# Patient Record
Sex: Male | Born: 1945 | Race: White | Hispanic: No | Marital: Married | Smoking: Former smoker
Health system: Southern US, Community
[De-identification: ages and names within clinical notes are randomized; demographics above are authoritative.]

## PROBLEM LIST (undated history)

## (undated) DIAGNOSIS — F1010 Alcohol abuse, uncomplicated: Secondary | ICD-10-CM

## (undated) DIAGNOSIS — M1990 Unspecified osteoarthritis, unspecified site: Secondary | ICD-10-CM

## (undated) DIAGNOSIS — I10 Essential (primary) hypertension: Secondary | ICD-10-CM

## (undated) DIAGNOSIS — Z87442 Personal history of urinary calculi: Secondary | ICD-10-CM

## (undated) DIAGNOSIS — IMO0001 Reserved for inherently not codable concepts without codable children: Secondary | ICD-10-CM

## (undated) DIAGNOSIS — E785 Hyperlipidemia, unspecified: Secondary | ICD-10-CM

## (undated) DIAGNOSIS — I639 Cerebral infarction, unspecified: Secondary | ICD-10-CM

## (undated) DIAGNOSIS — I209 Angina pectoris, unspecified: Secondary | ICD-10-CM

## (undated) HISTORY — PX: TONSILLECTOMY: SUR1361

## (undated) HISTORY — DX: Essential (primary) hypertension: I10

## (undated) HISTORY — DX: Alcohol abuse, uncomplicated: F10.10

## (undated) HISTORY — DX: Cerebral infarction, unspecified: I63.9

## (undated) HISTORY — DX: Hyperlipidemia, unspecified: E78.5

---
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
## 1984-09-27 HISTORY — PX: LUMBAR DISC SURGERY: SHX700

## 1984-09-27 HISTORY — PX: BACK SURGERY: SHX140

## 2004-11-04 ENCOUNTER — Ambulatory Visit (HOSPITAL_COMMUNITY): Admission: RE | Admit: 2004-11-04 | Discharge: 2004-11-04 | Payer: Self-pay | Admitting: Gastroenterology

## 2004-11-04 ENCOUNTER — Encounter (INDEPENDENT_AMBULATORY_CARE_PROVIDER_SITE_OTHER): Payer: Self-pay | Admitting: *Deleted

## 2005-06-22 LAB — HM COLONOSCOPY

## 2006-04-08 ENCOUNTER — Encounter: Admission: RE | Admit: 2006-04-08 | Discharge: 2006-04-08 | Payer: Self-pay | Admitting: Family Medicine

## 2010-05-26 ENCOUNTER — Ambulatory Visit: Payer: Self-pay | Admitting: Cardiovascular Disease

## 2010-05-26 ENCOUNTER — Inpatient Hospital Stay (HOSPITAL_COMMUNITY): Admission: EM | Admit: 2010-05-26 | Discharge: 2010-05-28 | Payer: Self-pay | Admitting: Emergency Medicine

## 2010-05-27 ENCOUNTER — Ambulatory Visit: Payer: Self-pay | Admitting: Vascular Surgery

## 2010-05-28 ENCOUNTER — Ambulatory Visit (HOSPITAL_COMMUNITY): Admission: AD | Admit: 2010-05-28 | Discharge: 2010-05-28 | Payer: Self-pay | Admitting: Cardiology

## 2010-06-02 ENCOUNTER — Inpatient Hospital Stay (HOSPITAL_COMMUNITY): Admission: EM | Admit: 2010-06-02 | Discharge: 2010-06-03 | Payer: Self-pay | Admitting: Emergency Medicine

## 2010-06-04 ENCOUNTER — Encounter
Admission: RE | Admit: 2010-06-04 | Discharge: 2010-08-05 | Payer: Self-pay | Source: Home / Self Care | Admitting: Advanced Practice Midwife

## 2010-10-18 ENCOUNTER — Encounter: Payer: Self-pay | Admitting: Family Medicine

## 2010-12-10 LAB — DIFFERENTIAL
Eosinophils Absolute: 0 10*3/uL (ref 0.0–0.7)
Eosinophils Relative: 1 % (ref 0–5)
Lymphocytes Relative: 32 % (ref 12–46)
Lymphs Abs: 1.9 10*3/uL (ref 0.7–4.0)
Monocytes Relative: 8 % (ref 3–12)

## 2010-12-10 LAB — COMPREHENSIVE METABOLIC PANEL
AST: 32 U/L (ref 0–37)
BUN: 9 mg/dL (ref 6–23)
CO2: 27 mEq/L (ref 19–32)
Calcium: 9.3 mg/dL (ref 8.4–10.5)
GFR calc Af Amer: >60 mL/min (ref >60)
Glucose, Bld: 99 mg/dL (ref 70–99)
Potassium: 3.7 mEq/L (ref 3.5–5.1)

## 2010-12-10 LAB — CBC
HCT: 40.4 % (ref 39.0–52.0)
Hemoglobin: 16 g/dL (ref 13.0–17.0)
MCV: 93.6 fL (ref 78.0–100.0)
MCV: 90.9 fL (ref 78.0–100.0)
Platelets: 166 10*3/uL (ref 150–400)
RBC: 4.32 MIL/uL (ref 4.22–5.81)
RDW: 12 % (ref 11.5–15.5)
WBC: 6.9 10*3/uL (ref 4.0–10.5)
WBC: 6 10*3/uL (ref 4.0–10.5)

## 2010-12-10 LAB — POCT CARDIAC MARKERS: Myoglobin, poc: 36.7 ng/mL (ref 12–200)

## 2010-12-10 LAB — URINALYSIS, ROUTINE W REFLEX MICROSCOPIC
Glucose, UA: NEGATIVE mg/dL
Protein, ur: NEGATIVE mg/dL

## 2010-12-10 LAB — PROTIME-INR
INR: 1.1 (ref 0.00–1.49)
Prothrombin Time: 14.4 seconds (ref 11.6–15.2)

## 2010-12-10 LAB — BASIC METABOLIC PANEL
Chloride: 103 mEq/L (ref 96–112)
Creatinine, Ser: 0.73 mg/dL (ref 0.4–1.5)
GFR calc non Af Amer: >60 mL/min (ref >60)
Glucose, Bld: 101 mg/dL — ABNORMAL HIGH (ref 70–99)

## 2010-12-10 LAB — LIPID PANEL: LDL Cholesterol: 47 mg/dL (ref 0–99)

## 2010-12-10 LAB — APTT: aPTT: 24 seconds (ref 24–37)

## 2010-12-11 LAB — DIFFERENTIAL
Lymphocytes Relative: 26 % (ref 12–46)
Lymphs Abs: 1.9 10*3/uL (ref 0.7–4.0)
Monocytes Relative: 9 % (ref 3–12)
Neutro Abs: 4.9 10*3/uL (ref 1.7–7.7)
Neutrophils Relative %: 64 % (ref 43–77)

## 2010-12-11 LAB — CARDIAC PANEL(CRET KIN+CKTOT+MB+TROPI)
Relative Index: INVALID (ref 0.0–2.5)
Total CK: 65 U/L (ref 7–232)

## 2010-12-11 LAB — URINALYSIS, ROUTINE W REFLEX MICROSCOPIC
Glucose, UA: NEGATIVE mg/dL
Specific Gravity, Urine: 1.033 — ABNORMAL HIGH (ref 1.005–1.030)
pH: 5.5 (ref 5.0–8.0)

## 2010-12-11 LAB — COMPREHENSIVE METABOLIC PANEL
Alkaline Phosphatase: 50 U/L (ref 39–117)
BUN: 15 mg/dL (ref 6–23)
CO2: 26 mEq/L (ref 19–32)
Calcium: 9.4 mg/dL (ref 8.4–10.5)
Chloride: 101 mEq/L (ref 96–112)
Creatinine, Ser: 0.67 mg/dL (ref 0.4–1.5)
GFR calc Af Amer: >60 mL/min (ref >60)

## 2010-12-11 LAB — CBC
HCT: 47.8 % (ref 39.0–52.0)
MCH: 32.7 pg (ref 26.0–34.0)
MCV: 94.4 fL (ref 78.0–100.0)
Platelets: 196 10*3/uL (ref 150–400)
RDW: 12.4 % (ref 11.5–15.5)

## 2010-12-11 LAB — LIPID PANEL
HDL: 47 mg/dL (ref >39)
LDL Cholesterol: 114 mg/dL — ABNORMAL HIGH (ref 0–99)
Triglycerides: 59 mg/dL (ref <150)
VLDL: 12 mg/dL (ref 0–40)

## 2010-12-11 LAB — HEMOGLOBIN A1C
Hgb A1c MFr Bld: 5.1 % (ref <5.7)
Mean Plasma Glucose: 100 mg/dL (ref <117)

## 2010-12-11 LAB — HOMOCYSTEINE: Homocysteine: 11.9 umol/L (ref 4.0–15.4)

## 2011-02-09 ENCOUNTER — Telehealth: Payer: Self-pay | Admitting: Internal Medicine

## 2011-02-09 NOTE — Telephone Encounter (Signed)
Forwarded to Dr. Norins for review. °

## 2011-02-11 ENCOUNTER — Encounter: Payer: Self-pay | Admitting: Internal Medicine

## 2011-02-11 ENCOUNTER — Ambulatory Visit (INDEPENDENT_AMBULATORY_CARE_PROVIDER_SITE_OTHER): Payer: BC Managed Care – PPO | Admitting: Internal Medicine

## 2011-02-11 VITALS — BP 130/90 | HR 63 | Temp 97.1°F | Wt 177.0 lb

## 2011-02-11 DIAGNOSIS — I639 Cerebral infarction, unspecified: Secondary | ICD-10-CM

## 2011-02-11 DIAGNOSIS — F1010 Alcohol abuse, uncomplicated: Secondary | ICD-10-CM

## 2011-02-11 DIAGNOSIS — E785 Hyperlipidemia, unspecified: Secondary | ICD-10-CM

## 2011-02-11 DIAGNOSIS — I10 Essential (primary) hypertension: Secondary | ICD-10-CM

## 2011-02-11 DIAGNOSIS — I6350 Cerebral infarction due to unspecified occlusion or stenosis of unspecified cerebral artery: Secondary | ICD-10-CM

## 2011-02-11 DIAGNOSIS — M25519 Pain in unspecified shoulder: Secondary | ICD-10-CM

## 2011-02-11 DIAGNOSIS — M25511 Pain in right shoulder: Secondary | ICD-10-CM

## 2011-02-11 MED ORDER — DICLOFENAC SODIUM 1 % TD GEL: 1.0000 "application " | Freq: Four times a day (QID) | TRANSDERMAL | Status: DC

## 2011-02-11 MED ORDER — HYDROCODONE-ACETAMINOPHEN 7.5-750 MG PO TABS: 1.0000 | ORAL_TABLET | Freq: Four times a day (QID) | ORAL | Status: DC | PRN

## 2011-02-11 NOTE — Patient Instructions (Signed)
For cholesterol - stop zocor (simvastatin) if pain goes away we will switch back to crestor   For shoulder pain - take the hydrocodone at bedtime. We will refer you to Dr. Rennis Chris.  For stroke - continu eon the p[lavix for at least a year.

## 2011-02-11 NOTE — Progress Notes (Signed)
Subjective:    Patient ID: [name], male    DOB: March 24, 1946, 65 y.o.   MRN: [id number]  HPI Patient presents to establish for on-going primary care.  He had strokes x 2 in August '11.He has made an excellent recovery with minimal residual weakness. He has had pain in the right shoulder with limitation in ROM. PT neighbor suggested frozen shoulder secondary internal derangement as problem.  He did see Dr. Pearlean Brownie in follow-up for stroke but  who diagnosed tendonitis in the right shoulder. He is having constant pain from the shoulder. His mobility of the shoulder is limited. He is not taking any pain medications at this time but he has had experience with narcotic medications in the past so that he knows that hydrocodone has been ineffective for him.  During hospitalization he was started on plavix and crestor. He has since noticed muscle pain especially in the legs when he does squats. Reviewed hospitial data: he had a baseline LDL 114; after starting crestor LDL was 47. Explained the need for low LDL due to high risk having had thrombotic strokes.  Reviewed excellent detailed notes from Dr. Lupita Raider - scanned into the record. Last OV SEPT 21, 2011 of particular interest.    Past Medical History  Diagnosis Date  . Alcohol abuse, unspecified   . Hypertension   . Kidney stone on right side     spontaneously passed  . Stroke     August '11 x 2: L. cerebellar, lateral medulla, ol rd lacunar infarcts;  2nd left pontine infarct.  . Hyperlipidemia     preTx 114, postTx 47 LDL   Past Surgical History  Procedure Date  . Tonsillectomy 1954  . Lumbar disc surgery 1986    Dr. Emilee Hero   Family History  Problem Relation Age of Onset  . Heart disease Father   . Hypertension Father    History   Social History  . Marital Status: Married    Spouse Name: N/A    Number of Children: 2  . Years of Education: 12   Occupational History  . retail sales - paint    Social History  Main Topics  . Smoking status: Former Smoker -- 1.0 packs/day for 15 years    Types: Cigarettes    Quit date: 09/27/1978  . Smokeless tobacco: Never Used  . Alcohol Use: No     stopped drinking after stroke. Had been drink 6 pk per day plus binge drinking  . Drug Use: Not on file  . Sexually Active: Yes -- Male partner(s)   Other Topics Concern  . Not on file   Social History Narrative   HSG. [employer] for 3 years - did a tour in [place]. Married '79. 1 dtr- '68, 1 step son. 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 Marriage in good health. He reports he is very strong in his faith.               Review of Systems Review of Systems  Constitutional:  Negative for fever, chills, activity change and unexpected weight change.  HENT:  Negative for hearing loss, ear pain, congestion, neck stiffness and postnasal drip.   Eyes: Negative for pain, discharge and visual disturbance.  Respiratory: Negative for chest tightness and wheezing.   Cardiovascular: Negative for chest pain and palpitations.       [No decreased exercise tolerance Gastrointestinal: [No change in bowel habit. No  bloating or gas. No reflux or indigestion Genitourinary: Positive for urgency, frequency - nocturia q 2hrs. No  flank pain or  difficulty urinating.  Musculoskeletal: Negative for myalgias, back pain and gait problem. Severe pain right shoulder with limitation in ROM. Neurological: Negative for dizziness, tremors, weakness and headaches.  Hematological: Negative for adenopathy.  Psychiatric/Behavioral: Negative for behavioral problems and dysphoric mood.       Objective:   Physical Exam Vitals reviewed and stable Gen' WNWD WM lookings younger than his chronologic age. No distress HEENT - [place]/AT, C&S clear, PERRLA  Pul - normal respirations Cor - RRR MSK - decreased ROM due to pain with flexion 80%, extension 80%, adduction - to 90 degrees, abduction  close to normal. No crepitus in the shoulder, no click. Very tender to palpation of the insertion of the short -head of the biceps, tender to palp of the long head of the biceps. Neuro -  CN II-XII intact with no facial asymmetry. MS - nl stand and walk. Cerebellar - no tremor.      Procedure Joint/bursal injection-Right shoulder  Indication - localized pain Consent - informed verbal consent from patient after explanation of risks of bleeding and infection Prep - injection site identified, prepped with betadine followed by alcohol. Med -    40 Mg depomedrol with 0.5Cc 2% xylocain Injection - bursa/joint space entered easily. Injected without difficulty. Patient tolerated this well. Post-procedure - patient with no reduction in discomfort. Bandaid applied. Routine precautions provided including instruction to return for fever, drainage or increased pain     Assessment & Plan:  1. Neuro - s/p CVA x 2. Discussed the use of plavix in the first year after stroke. He has made a good recovery.  Plan - continued risk reduction            F/u with Dr. Pearlean Brownie as instructed.  2. Hyperlipidemia - educated [name] about how his risk profile had gone from normal to high and the LDL goal dropped from 130 or less to less the 70. He is below goal on his current regimen  Plan - continue present medication.  3. Hypertension - adequate control - borderline.  Plan - continue present medications            Keep a record of home monitoring levels   4. Alcohol Abuse - he is encouraged to continue his abstinence. He is encouraged to particpate in AA and to get a sponsor.   5. Shoulder pain - no relief from intra-articular injection along with an absence of positive joint findings on exam with tenderness of tendonis insertions suggests tendonitis.  Plan - refer to Dr. Rennis Chris           Trial of hydrocodone 7.5

## 2011-02-12 ENCOUNTER — Encounter: Payer: Self-pay | Admitting: Internal Medicine

## 2011-02-12 ENCOUNTER — Telehealth: Payer: Self-pay | Admitting: *Deleted

## 2011-02-12 DIAGNOSIS — E785 Hyperlipidemia, unspecified: Secondary | ICD-10-CM | POA: Insufficient documentation

## 2011-02-12 DIAGNOSIS — F10188 Alcohol abuse with other alcohol-induced disorder: Secondary | ICD-10-CM | POA: Insufficient documentation

## 2011-02-12 DIAGNOSIS — I639 Cerebral infarction, unspecified: Secondary | ICD-10-CM | POA: Insufficient documentation

## 2011-02-12 DIAGNOSIS — F1010 Alcohol abuse, uncomplicated: Secondary | ICD-10-CM | POA: Insufficient documentation

## 2011-02-12 DIAGNOSIS — I10 Essential (primary) hypertension: Secondary | ICD-10-CM | POA: Insufficient documentation

## 2011-02-12 MED ORDER — METHYLPREDNISOLONE ACETATE 80 MG/ML IJ SUSP: 40.0000 mg | Freq: Once | INTRAMUSCULAR | Status: DC

## 2011-02-12 MED ORDER — OXYCODONE-ACETAMINOPHEN 7.5-325 MG PO TABS: 1.0000 | ORAL_TABLET | Freq: Four times a day (QID) | ORAL | Status: DC | PRN

## 2011-02-12 NOTE — Telephone Encounter (Signed)
Oxycodone/apap 7.5/325 1 po q 6 prn, # 60

## 2011-02-12 NOTE — Telephone Encounter (Signed)
Patient requesting RX for oxycodone. He says he took 3 of the hydrocodone with NO relief. Please advise.

## 2011-02-12 NOTE — Telephone Encounter (Signed)
Rx ready for pick up , let pt VM that he may come by in the AM between 9 and 12

## 2011-02-12 NOTE — Op Note (Signed)
NAME:  [name], [name]               ACCOUNT NO.:  [id number]   MEDICAL RECORD NO.:  [id number]          PATIENT TYPE:  AMB   LOCATION:  ENDO                         FACILITY:  [facility]   PHYSICIAN:  James L. Malon Kindle., M.D.DATE OF BIRTH:  October 25, 1945   DATE OF PROCEDURE:  11/04/2004  DATE OF DISCHARGE:                                 OPERATIVE REPORT   PROCEDURE:  Colonoscopy and polypectomy.   MEDICATIONS:  Fentanyl 137.5 mcg, Versed 12 mg IV.   INDICATIONS FOR PROCEDURE:  Colon cancer screening.   DESCRIPTION OF PROCEDURE:  The procedure had been explained to the patient  and consent obtained.  With the patient in the left lateral decubitus  position, the Olympus scope was inserted and advanced.  Prep was excellent.  We were able to reach the cecum without difficulty.  The ileocecal valve and  appendiceal orifice were seen.  The scope was withdrawn.  In the ascending  colon, a 0.75 cm polyp was removed and sucked through the scope.  Just a bit  above that, another 0.5 cm polyp was removed and sucked through the scope.  These were approximately 10 cm apart and I put them in the same jar.  The  hepatic flexure, transverse colon, splenic flexure, descending and sigmoid  colon were free of polyps.  There was no significant diverticular disease.  The scope was withdrawn.  The patient tolerated the procedure well.   ASSESSMENT:  Polyps removed from the ascending colon.  211.3   PLAN:  Will check pathology and made a recommendation after the pathology is  returned.  Routine postpolypectomy instructions.      JLE/MEDQ  D:  11/04/2004  T:  11/04/2004  Job:  [id number]   cc:   Donia Guiles, M.D.  [address] [place]  [place] [address]  Fax: [phone]

## 2011-02-17 ENCOUNTER — Telehealth: Payer: Self-pay

## 2011-02-17 NOTE — Telephone Encounter (Signed)
Received fax stating that insurance company will not cover Voltaren gel without a PA. Formulary alt are diclofenac sodium, etodolac,naproxen, nabumetone, or meloxicam.  Please advise if pt has a contraindication/drug interaction to generic NSAIDS or inadequate treatment response. Thanks

## 2011-02-17 NOTE — Telephone Encounter (Signed)
PA justification: he has a h./o CVA and is on plavix maintenance. He is intolerant due to discomfort oral NSAIDs. Topical NSAID is indicated as appropriate therapy for local joint disease/pain for the at risk patient.

## 2011-02-25 NOTE — Telephone Encounter (Signed)
PA form requested from [facility] @ [phone] PA form recvd, completed & faxed for Approval 02/23/11 Approval recvd & faxed to pharmacy @ [phone] [02/25/11]

## 2011-03-08 ENCOUNTER — Ambulatory Visit (INDEPENDENT_AMBULATORY_CARE_PROVIDER_SITE_OTHER): Payer: BC Managed Care – PPO | Admitting: Internal Medicine

## 2011-03-09 ENCOUNTER — Ambulatory Visit (INDEPENDENT_AMBULATORY_CARE_PROVIDER_SITE_OTHER): Payer: BC Managed Care – PPO | Admitting: Internal Medicine

## 2011-03-09 ENCOUNTER — Encounter: Payer: Self-pay | Admitting: Internal Medicine

## 2011-03-09 DIAGNOSIS — M25519 Pain in unspecified shoulder: Secondary | ICD-10-CM

## 2011-03-09 DIAGNOSIS — M25511 Pain in right shoulder: Secondary | ICD-10-CM

## 2011-03-09 DIAGNOSIS — F1010 Alcohol abuse, uncomplicated: Secondary | ICD-10-CM

## 2011-03-09 DIAGNOSIS — I639 Cerebral infarction, unspecified: Secondary | ICD-10-CM

## 2011-03-09 DIAGNOSIS — I6350 Cerebral infarction due to unspecified occlusion or stenosis of unspecified cerebral artery: Secondary | ICD-10-CM

## 2011-03-09 DIAGNOSIS — I10 Essential (primary) hypertension: Secondary | ICD-10-CM

## 2011-03-09 MED ORDER — ROSUVASTATIN CALCIUM 5 MG PO TABS: 5.0000 mg | ORAL_TABLET | Freq: Every day | ORAL | Status: DC

## 2011-03-09 MED ORDER — OXYCODONE-ACETAMINOPHEN 10-325 MG PO TABS: 1.0000 | ORAL_TABLET | Freq: Four times a day (QID) | ORAL | Status: DC | PRN

## 2011-03-11 NOTE — Assessment & Plan Note (Signed)
Persistent shoulder pain with limitation in ROM  Plan - keep appointment with Dr. Rennis Chris           Percocet changed to 10/325

## 2011-03-11 NOTE — Assessment & Plan Note (Signed)
Stable. He is continuing his rehab work on his own. He is hoping for full recovery of function in his right leg.

## 2011-03-11 NOTE — Assessment & Plan Note (Signed)
Continues a successful recovery. He relies on his faith and faith community for support in this regard and does not feel that AA offers any additional benefit. He does understand that this is a medical problem and a part of his care is monitoring his recovery.

## 2011-03-11 NOTE — Progress Notes (Signed)
  Subjective:    Patient ID: [name], male    DOB: 03/21/1946, 65 y.o.   MRN: [id number]  HPI [name] presents for follow-up after recently establishing as a new patient. In the interval he has received and reviewed the initial note. He did take a little umbrage at the listing of alcohol abuse as a medical history problem. Explained that this is a disease and an important part of his medical history. He is in recovery since his stroke last August and is given encouragement.  He reports that he continues to exercise and carry out the instructions given to him by neuro-PT. He still has some right leg weakness.   He reports that he is still having a lot of shoulder pain. He is scheduled to see Dr. Rennis Chris for consultation Weds June 13th. He is asking for an increase in the dose of oxycodone.  PMH, FamHx and SocHx reviewed for any changes and relevance.    Review of Systems Review of Systems  Constitutional:  Negative for fever, chills, activity change and unexpected weight change.  HEENT:  Negative for hearing loss, ear pain, congestion, neck stiffness and postnasal drip. Negative for sore throat or swallowing problems. Negative for dental complaints.   Eyes: Negative for vision loss or change in visual acuity.  Respiratory: Negative for chest tightness and wheezing.   Cardiovascular: Negative for chest pain and palpitation. No decreased exercise tolerance Gastrointestinal: No change in bowel habit. No bloating or gas. No reflux or indigestion Genitourinary: Negative for urgency, frequency, flank pain and difficulty urinating.  Musculoskeletal: Negative for myalgias, back pain, and gait problem. Positive for right shoulder pain and limitation in ROM Neurological: Negative for dizziness, tremors, weakness and headaches.  Hematological: Negative for adenopathy.  Psychiatric/Behavioral: Negative for behavioral problems and dysphoric mood.       Objective:   Physical Exam Vitals  noted - great BP control Gen'l - WNWD white man looking younger than his stated age Pul - normal respirations Cor - RRR Neuro - A&O x 3, CN II-XII appear normal. MS - no obvious deficits.       Assessment & Plan:

## 2011-03-11 NOTE — Assessment & Plan Note (Signed)
Excellent control on his present medication

## 2011-04-27 ENCOUNTER — Other Ambulatory Visit: Payer: Self-pay | Admitting: Internal Medicine

## 2011-05-03 ENCOUNTER — Encounter: Payer: Self-pay | Admitting: Internal Medicine

## 2011-05-03 ENCOUNTER — Ambulatory Visit (INDEPENDENT_AMBULATORY_CARE_PROVIDER_SITE_OTHER): Payer: BC Managed Care – PPO | Admitting: Internal Medicine

## 2011-05-03 DIAGNOSIS — I639 Cerebral infarction, unspecified: Secondary | ICD-10-CM

## 2011-05-03 DIAGNOSIS — E785 Hyperlipidemia, unspecified: Secondary | ICD-10-CM

## 2011-05-03 DIAGNOSIS — I10 Essential (primary) hypertension: Secondary | ICD-10-CM

## 2011-05-03 DIAGNOSIS — F1010 Alcohol abuse, uncomplicated: Secondary | ICD-10-CM

## 2011-05-03 MED ORDER — GEMFIBROZIL 600 MG PO TABS: 600.0000 mg | ORAL_TABLET | Freq: Two times a day (BID) | ORAL | Status: DC

## 2011-05-03 NOTE — Patient Instructions (Signed)
Stroke - it will be up to the neurologist as to the continuation of Plavix.  Leg weakness - it may be the crestor vs residual effects of the stroke. Plan - stop crestor completely. If the pain goes completely away it will evidence that it was drug related. If the pain does not go away it is not the drug.  It is important that you resume your rehab exercises to insure maximal persistent recovery of strength.  Nutrition - it is important that you are adequately nourished. You need to eat 3 squares a day.   Alcohol - it is VERY concerning that you have resumed regular use of alcohol. The working definition of addiction is to continue a habit when you have clear proof that it is harmful. Please stop drinking. Please consider AA if you are struggling to stop alcohol.   Cholesterol control - if you stop the crestor - after 2 weeks start lopid: 1/2 tablet once a day for 5 days, 1/2 tablet AM & PM x 5 days, 1 tablet AM 1/2 tablet PM x 5 days and then 1 tablet twice a day. Will need to check lipid panel 8 weeks after starting lopid.   Blood pressure - continue to monitor at home. If the pressure is running 145 or higher on a regular basis report back so that we can make adjustments in your medications.  Domestic stress - if this becomes more of a problem with the need to move or make other changes report back so that we can help with the stress.

## 2011-05-03 NOTE — Assessment & Plan Note (Signed)
BP Readings from Last 3 Encounters:  05/03/11 152/90  03/09/11 120/80  02/11/11 130/90   BP is elevated today on tenoretic once a day.  Plan - continue to monitor at home. If the readings stay high will need to adjust medication.

## 2011-05-03 NOTE — Assessment & Plan Note (Signed)
Concern for drug related muscle pain, although the isolation to just the right leg and only with a deep squat type maneuver raises questions about this being a "statin" related problem.  Plan - stop lipitor, if pain goes away will try alternative medication of lopid bid if any medication is indicated at all. He does make a good point that is lipids were low and that the advantage is in using the "statin" for reduction of risk of further cerebral vessel occlusion.

## 2011-05-03 NOTE — Assessment & Plan Note (Signed)
Persistent right leg weakness which may the sequellae of his stroke.  Plan - he is encouraged to continue an exercise program including what he learned in neuro-rehab

## 2011-05-03 NOTE — Assessment & Plan Note (Addendum)
Reiterated the nature of addiction and the dangers of drinking. He is encouraged to stop drinking and to consider AA.  His pattern of being to tired to cook, drinking and not adequately exercising may contribute to his weakness and fatigue.  Plan Eat better         Routine labs: CBC, LFTs Iron panel if his symptoms of fatigue persis.

## 2011-05-03 NOTE — Progress Notes (Signed)
Subjective:    Patient ID: [name], male    DOB: 03/27/1946, 65 y.o.   MRN: [id number]  HPI [name] returns for follow-up. Please see previous notes: followed for effects post-stroke that left him w/ right leg weakness. Since returning to work in January it has been a struggle to have enough energy to have the ability to get his work done - very physical work in a [employer]all[employer].   He reports that he has not been taking the time to cook thus his diet has slipped.   He is drinking 2-3 beers a night despite having a known alcohol problem in the past. He has not gone to AA but has stopped getting as much support from his church group. We discussed the nature of addictions - continuing a use behavior in the face of known personal danger. There is some stress in the home and marriage with a discussion about down-sizing. He is also contemplating retirement. All this contributes to stress and perhaps his return to the use of alcohol.   He reports that he has pain in the right leg aggravated by work, e.g.  Any deep squat like movement involving the right leg is difficult. He believes this is due to crestor, although there is not other muscle or limb involvement.   Past Medical History  Diagnosis Date  . Alcohol abuse, unspecified   . Hypertension   . Kidney stone on right side     spontaneously passed  . Stroke     August '11 x 2: L. cerebellar, lateral medulla, ol rd lacunar infarcts;  2nd left pontine infarct.  . Hyperlipidemia     preTx 114, postTx 47 LDL   Past Surgical History  Procedure Date  . Tonsillectomy 1954  . Lumbar disc surgery 1986    Dr. Emilee Hero   Family History  Problem Relation Age of Onset  . Heart disease Father   . Hypertension Father    History   Social History  . Marital Status: Married    Spouse Name: N/A    Number of Children: 2  . Years of Education: 12   Occupational History  . retail sales - paint    Social History Main Topics  . Smoking  status: Former Smoker -- 1.0 packs/day for 15 years    Types: Cigarettes    Quit date: 09/27/1978  . Smokeless tobacco: Never Used  . Alcohol Use: No     stopped drinking after stroke. Had been drink 6 pk per day plus binge drinking  . Drug Use: Not on file  . Sexually Active: Yes -- Male partner(s)   Other Topics Concern  . Not on file   Social History Narrative   HSG. [employer] for 3 years - did a tour in [place]. Married '79. 1 dtr- '68, 1 step son. 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 Marriage in good health. He reports he is very strong in his faith.       Review of Systems Review of Systems  Constitutional:  Negative for fever, chills, activity change and unexpected weight change. He does have brief transient light-headedness with position change.  HEENT:  Negative for hearing loss, ear pain, congestion, neck stiffness and postnasal drip. Negative for sore throat or swallowing problems. Negative for dental complaints.   Eyes: Negative for vision loss or change in visual acuity.  Respiratory: Negative for chest tightness and wheezing.  Cardiovascular: Negative for chest pain and palpitation. No decreased exercise tolerance Gastrointestinal: No change in bowel habit. No bloating or gas. No reflux or indigestion Genitourinary: Negative for urgency, frequency, flank pain and difficulty urinating.  Musculoskeletal: Negative for myalgias, back pain, arthralgias and gait problem.  Neurological: Negative for dizziness, tremors, weakness and headaches.  Hematological: Negative for adenopathy.  Psychiatric/Behavioral: Negative for behavioral problems and dysphoric mood.       Objective:   Physical Exam Vitals stable Gen'l - WNWD white male in no distress HEENT - C&S clear, tracks normally. Neck - supple Nodes - negative cervical and supraclavicular regions. Chest - good breath sounds Cor - RRR w/o murmur, rubs. MSK - has a  stiff-limbed gait favoring the right LE. No joint deformity is appreciated. Neuro- A&O x 3, CN II-XII grossly normal. MS - 4/5 right hip flexors, 4/5 LE extension, 3/5 right knee flexion, normal foot strength.  DTR's 2+ reflexes biceps radial and patellar tendons. Skin- clear             Assessment & Plan:

## 2011-05-06 ENCOUNTER — Other Ambulatory Visit: Payer: Self-pay | Admitting: *Deleted

## 2011-05-06 MED ORDER — ATENOLOL-CHLORTHALIDONE 50-25 MG PO TABS: 1.0000 | ORAL_TABLET | Freq: Every day | ORAL | Status: DC

## 2011-07-14 ENCOUNTER — Other Ambulatory Visit: Payer: Self-pay | Admitting: Internal Medicine

## 2011-07-21 ENCOUNTER — Telehealth: Payer: Self-pay | Admitting: *Deleted

## 2011-07-21 NOTE — Telephone Encounter (Signed)
Pt called and states he is still having muscle weakness in pain. Pt states the Percocet 10 is no longer working to relieve pain. What do you advise for pt?

## 2011-07-21 NOTE — Telephone Encounter (Signed)
ROV.   

## 2011-07-22 NOTE — Telephone Encounter (Signed)
Spoke with patient.  1. He complained that his wait time was 2 hours every time he comes to the office. I explained in detail that Dr Debby Bud takes emergent patients frequently to avoid long unnecessary ER visits. He said "if I have an emergency, I'm going to the ER". I then told him that Dr Debby Bud will give him as much time as he needs during every OV no matter what. He thanked me for giving him the time to "vent".  2. He stopped the crestor and has had no change in pain.  3. Percocet has not helped w/pain and he wants stronger med. I explained to the patient that he must see an MD for additional pain meds.  I offered an apt with another MD tomorrow. He does not want to "explain the situation to another doctor" and already has a relationship with Dr Debby Bud. I explained that any other MD in the office could help at least give him a different/additional RX at OV to help easy his pain and we could get pt in to see Dr Debby Bud first of next week.   Patient decided to discuss his situation with his wife and will call in the am if he decides to see another MD tomorrow. He will also call back and schedule an apt with Dr Debby Bud next week.

## 2011-07-23 NOTE — Telephone Encounter (Signed)
1. If the leg pain is not improved off crestor - resume crestor at previous dose and do not start lopid 2. Leg pain - unsure as to cause. Can discuss, reexam at next visit. He was encouraged to follow-up with neurology at his last visit.

## 2011-07-30 NOTE — Telephone Encounter (Signed)
Pt informed

## 2011-09-27 ENCOUNTER — Telehealth: Payer: Self-pay | Admitting: Internal Medicine

## 2011-09-27 NOTE — Telephone Encounter (Signed)
Received copies from [employer] 12.31.12. Forwarded 1 pages to Dr. Debby Bud ,for review.  sj

## 2011-10-11 ENCOUNTER — Ambulatory Visit (INDEPENDENT_AMBULATORY_CARE_PROVIDER_SITE_OTHER): Payer: BC Managed Care – PPO | Admitting: Internal Medicine

## 2011-10-11 VITALS — BP 122/74 | HR 59 | Temp 97.4°F | Wt 180.0 lb

## 2011-10-11 DIAGNOSIS — M545 Low back pain, unspecified: Secondary | ICD-10-CM

## 2011-10-11 MED ORDER — CYCLOBENZAPRINE HCL 10 MG PO TABS: 10.0000 mg | ORAL_TABLET | Freq: Three times a day (TID) | ORAL | Status: AC | PRN

## 2011-10-11 NOTE — Patient Instructions (Signed)
Low back pain with no sign of a pinched nerve. This appears to be muscle strain.  Plan - aleve 220 mg twice a day           Tylenol 500 mg , two tablets three times a day           Flexeril 10 mg every 8 hours for muscle spasm   Back Pain, Adult Low back pain is very common. About 1 in 5 people have back pain. The cause of low back pain is rarely dangerous. The pain often gets better over time. About half of people with a sudden onset of back pain feel better in just 2 weeks. About 8 in 10 people feel better by 6 weeks.   CAUSES Some common causes of back pain include:  Strain of the muscles or ligaments supporting the spine.     Wear and tear (degeneration) of the spinal discs.     Arthritis.    Direct injury to the back.  DIAGNOSIS Most of the time, the direct cause of low back pain is not known. However, back pain can be treated effectively even when the exact cause of the pain is unknown. Answering your caregiver's questions about your overall health and symptoms is one of the most accurate ways to make sure the cause of your pain is not dangerous. If your caregiver needs more information, he or she may order lab work or imaging tests (X-rays or MRIs). However, even if imaging tests show changes in your back, this usually does not require surgery. HOME CARE INSTRUCTIONS For many people, back pain returns. Since low back pain is rarely dangerous, it is often a condition that people can learn to manage on their own.    Remain active. It is stressful on the back to sit or stand in one place. Do not sit, drive, or stand in one place for more than 30 minutes at a time. Take short walks on level surfaces as soon as pain allows. Try to increase the length of time you walk each day.     Do not stay in bed. Resting more than 1 or 2 days can delay your recovery.     Do not avoid exercise or work. Your body is made to move. It is not dangerous to be active, even though your back may hurt. Your  back will likely heal faster if you return to being active before your pain is gone.     Pay attention to your body when you  bend and lift. Many people have less discomfort when lifting if they bend their knees, keep the load close to their bodies, and avoid twisting. Often, the most comfortable positions are those that put less stress on your recovering back.     Find a comfortable position to sleep. Use a firm mattress and lie on your side with your knees slightly bent. If you lie on your back, put a pillow under your knees.     Only take over-the-counter or prescription medicines as directed by your caregiver. Over-the-counter medicines to reduce pain and inflammation are often the most helpful. Your caregiver may prescribe muscle relaxant drugs. These medicines help dull your pain so you can more quickly return to your normal activities and healthy exercise.     Put ice on the injured area.     Put ice in a plastic bag.     Place a towel between your skin and the bag.     Leave  the ice on for 15 to 20 minutes, 3 to 4 times a day for the first 2 to 3 days. After that, ice and heat may be alternated to reduce pain and spasms.     Ask your caregiver about trying back exercises and gentle massage. This may be of some benefit.     Avoid feeling anxious or stressed. Stress increases muscle tension and can worsen back pain. It is important to recognize when you are anxious or stressed and learn ways to manage it. Exercise is a great option.  SEEK MEDICAL CARE IF:  You have pain that is not relieved with rest or medicine.     You have pain that does not improve in 1 week.     You have new symptoms.     You are generally not feeling well.  SEEK IMMEDIATE MEDICAL CARE IF:    You have pain that radiates from your back into your legs.     You develop new bowel or bladder control problems.     You have unusual weakness or numbness in your arms or legs.     You develop nausea or vomiting.      You develop abdominal pain.     You feel faint.  Document Released: 09/13/2005 Document Revised: 05/26/2011 Document Reviewed: 02/01/2011 [facility] Patient Information 2012 [place], [place].

## 2011-10-11 NOTE — Progress Notes (Signed)
  Subjective:    Patient ID: [name], male    DOB: 29-Mar-1946, 66 y.o.   MRN: [id number]  HPI Mr. Frett presents for back pain. He has had HNP in '86 with chronic back issues. Saturday he was carrying his 20 lb dog going up stairs. He felt a pain int he back and then fell down 4 steps. His left hand absorbed most of the fall - sustained bruising of the thenar eminence. He was able to get up with assistance. He took aleve and rested for 40 minutes and then went to the Vet. While at the vet his legs gave out but he was able to drive home. He had another episode of severe back pain and had a controlled fall to the floor. Throughout the day every time he would go to reach or lift objects he would have pain and collapse. He was icing his back. Sunday was OK. Today he awoke feeling well but had a sharp pain when putting on his socks and his symptoms were returned.   I have reviewed the patient's medical history in detail and updated the computerized patient record.    Review of Systems System review is negative for any constitutional, cardiac, pulmonary, GI or neuro symptoms or complaints other than as described in the HPI.     Objective:   Physical Exam Filed Vitals:   10/11/11 1107  BP: 122/74  Pulse: 59  Temp: 97.4 F (36.3 C)   Gen'l- WNWD white male PUlm - normal Cor- RRR Back exam: normal stand; normal flex to greater than 100 degrees; normal gait; normal toe/heel walk; normal step up to exam table; normal SLR sitting; normal DTRs at the patellar tendons; normal sensation to light touch, pin-prick and deep vibratory stimulus; no  CVA tenderness; able to move supine to sitting witout assistance.        Assessment & Plan:  Back pain - no radicular findings on exam. Suspect muscle strain.  Plan - muscle relaxant          NASIDs and APAP

## 2011-10-28 ENCOUNTER — Other Ambulatory Visit: Payer: Self-pay | Admitting: Internal Medicine

## 2011-10-28 NOTE — Telephone Encounter (Signed)
Done

## 2011-11-15 ENCOUNTER — Encounter: Payer: Self-pay | Admitting: Internal Medicine

## 2011-11-15 ENCOUNTER — Ambulatory Visit (INDEPENDENT_AMBULATORY_CARE_PROVIDER_SITE_OTHER): Payer: BC Managed Care – PPO | Admitting: Internal Medicine

## 2011-11-15 ENCOUNTER — Other Ambulatory Visit (INDEPENDENT_AMBULATORY_CARE_PROVIDER_SITE_OTHER): Payer: BC Managed Care – PPO

## 2011-11-15 VITALS — BP 140/90 | HR 65 | Temp 97.4°F | Resp 16 | Ht 73.0 in | Wt 178.2 lb

## 2011-11-15 DIAGNOSIS — I10 Essential (primary) hypertension: Secondary | ICD-10-CM

## 2011-11-15 DIAGNOSIS — F1010 Alcohol abuse, uncomplicated: Secondary | ICD-10-CM

## 2011-11-15 DIAGNOSIS — E785 Hyperlipidemia, unspecified: Secondary | ICD-10-CM

## 2011-11-15 DIAGNOSIS — I639 Cerebral infarction, unspecified: Secondary | ICD-10-CM

## 2011-11-15 DIAGNOSIS — Z125 Encounter for screening for malignant neoplasm of prostate: Secondary | ICD-10-CM

## 2011-11-15 DIAGNOSIS — Z23 Encounter for immunization: Secondary | ICD-10-CM

## 2011-11-15 DIAGNOSIS — Z0000 Encounter for general adult medical examination without abnormal findings: Secondary | ICD-10-CM

## 2011-11-15 LAB — CBC WITH DIFFERENTIAL/PLATELET
Basophils Relative: 0.3 % (ref 0.0–3.0)
Eosinophils Absolute: 0.1 10*3/uL (ref 0.0–0.7)
Hemoglobin: 15.7 g/dL (ref 13.0–17.0)
Lymphs Abs: 1.8 10*3/uL (ref 0.7–4.0)
MCHC: 34 g/dL (ref 30.0–36.0)
MCV: 93.2 fl (ref 78.0–100.0)
Monocytes Absolute: 0.5 10*3/uL (ref 0.1–1.0)
Neutro Abs: 2.8 10*3/uL (ref 1.4–7.7)
RBC: 4.94 Mil/uL (ref 4.22–5.81)

## 2011-11-15 LAB — COMPREHENSIVE METABOLIC PANEL
AST: 25 U/L (ref 0–37)
BUN: 14 mg/dL (ref 6–23)
CO2: 28 mEq/L (ref 19–32)
Calcium: 9.7 mg/dL (ref 8.4–10.5)
Chloride: 100 mEq/L (ref 96–112)
Creatinine, Ser: 0.7 mg/dL (ref 0.4–1.5)
GFR: 131.25 mL/min (ref >60.00)

## 2011-11-15 LAB — LIPID PANEL
Cholesterol: 176 mg/dL (ref 0–200)
HDL: 66.3 mg/dL (ref >39.00)
LDL Cholesterol: 102 mg/dL — ABNORMAL HIGH (ref 0–99)
Triglycerides: 41 mg/dL (ref 0.0–149.0)

## 2011-11-15 LAB — HEPATIC FUNCTION PANEL
Alkaline Phosphatase: 52 U/L (ref 39–117)
Bilirubin, Direct: 0.2 mg/dL (ref 0.0–0.3)

## 2011-11-15 NOTE — Progress Notes (Signed)
Subjective:    Patient ID: [name], male    DOB: Feb 02, 1946, 66 y.o.   MRN: [id number]  HPI [name] presents for routine medical exam. He was last seen in mid-January for back pain that progressed. He was seeing a chiropractor daily for 12 days with good results. He did have plain x-rays of the back. He was diagnosed with a "pinched nerve."  He is otherwise doing well.   Past Medical History  Diagnosis Date  . Alcohol abuse, unspecified   . Hypertension   . Kidney stone on right side     spontaneously passed  . Stroke     August '11 x 2: L. cerebellar, lateral medulla, ol rd lacunar infarcts;  2nd left pontine infarct.  . Hyperlipidemia     preTx 114, postTx 47 LDL   Past Surgical History  Procedure Date  . Tonsillectomy 1954  . Lumbar disc surgery 1986    Dr. Emilee Hero   Family History  Problem Relation Age of Onset  . Heart disease Father   . Hypertension Father    History   Social History  . Marital Status: Married    Spouse Name: N/A    Number of Children: 2  . Years of Education: 12   Occupational History  . retail sales - paint    Social History Main Topics  . Smoking status: Former Smoker -- 1.0 packs/day for 15 years    Types: Cigarettes    Quit date: 09/27/1978  . Smokeless tobacco: Never Used  . Alcohol Use: No     stopped drinking after stroke. Had been drink 6 pk per day plus binge drinking  . Drug Use: Not on file  . Sexually Active: Yes -- Male partner(s)   Other Topics Concern  . Not on file   Social History Narrative   [employer]G. [employer] for 3 years - did a tour in [place]. Married '79. 1 dtr- '68, 1 step son. 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 Marriage in good health. He reports he is very strong in his faith.       Review of Systems Constitutional:  Negative for fever, chills, activity change and unexpected weight change.  HEENT:  Negative for hearing loss, ear pain,  congestion, neck stiffness and postnasal drip. Negative for sore throat or swallowing problems. Negative for dental complaints.   Eyes: Negative for vision loss or change in visual acuity.  Respiratory: Negative for chest tightness and wheezing. Negative for DOE.   Cardiovascular: Negative for chest pain or palpitations. No decreased exercise tolerance Gastrointestinal: No change in bowel habit. No bloating or gas. No reflux or indigestion Genitourinary: Negative for urgency, frequency, flank pain and difficulty urinating.  Musculoskeletal: Negative for myalgias, back pain, arthralgias and gait problem.  Neurological: Negative for dizziness, tremors, weakness and headaches.  Hematological: Negative for adenopathy.  Psychiatric/Behavioral: Negative for behavioral problems and dysphoric mood.       Objective:   Physical Exam Filed Vitals:   11/15/11 0909  BP: 140/90  Pulse: 65  Temp: 97.4 F (36.3 C)  Resp: 16  Weight: 178 lb 4 oz (80.854 kg)  Body mass index is 23.52 kg/(m^2).  Gen'l: Well nourished well developed white male in no acute distress  HEENT: Head: Normocephalic and atraumatic. Right Ear: External ear normal. EAC/TM nl. Left Ear: External ear normal.  EAC/TM nl. Nose: Nose normal. Mouth/Throat: Oropharynx is clear and moist. Dentition -  native, in good repair. No buccal or palatal lesions. Posterior pharynx clear. Eyes: Conjunctivae and sclera clear. EOM intact. Pupils are equal, round, and reactive to light. Right eye exhibits no discharge. Left eye exhibits no discharge. Neck: Normal range of motion. Neck supple. No JVD present. No tracheal deviation present. No thyromegaly present.  Cardiovascular: Normal rate, regular rhythm, no gallop, no friction rub, no murmur heard.      Quiet precordium. 2+ radial and DP pulses . No carotid bruits Pulmonary/Chest: Effort normal. No respiratory distress or increased WOB, no wheezes, no rales. No chest wall deformity or  CVAT. Abdominal: Soft. Bowel sounds are normal in all quadrants. He exhibits no distension, no tenderness, no rebound or guarding, No heptosplenomegaly  Genitourinary:   Musculoskeletal: Normal range of motion. He exhibits no edema and no tenderness.       Small and large joints without redness, synovial thickening or deformity. Full range of motion preserved about all small, median and large joints.  Lymphadenopathy:    He has no cervical or supraclavicular adenopathy.  Neurological: He is alert and oriented to person, place, and time. CN II-XII intact. DTRs 2+ and symmetrical biceps, radial and patellar tendons. Cerebellar function normal with no tremor, rigidity, normal gait and station.  Skin: Skin is warm and dry. No rash noted. No erythema.  Psychiatric: He has a normal mood and affect. His behavior is normal. Thought content normal.   Lab Results  Component Value Date   WBC 5.2 11/15/2011   HGB 15.7 11/15/2011   HCT 46.0 11/15/2011   PLT 205.0 11/15/2011   GLUCOSE 105* 11/15/2011   CHOL 176 11/15/2011   TRIG 41.0 11/15/2011   HDL 66.30 11/15/2011   LDLCALC 102* 11/15/2011        ALT 19 11/15/2011   AST 25 11/15/2011        NA 137 11/15/2011   K 4.1 11/15/2011   CL 100 11/15/2011   CREATININE 0.7 11/15/2011   BUN 14 11/15/2011   CO2 28 11/15/2011   PSA 1.46 11/15/2011   INR 1.10 06/02/2010   HGBA1C  Value: 5.1                                                                        05/26/2010         Assessment & Plan:

## 2011-11-16 DIAGNOSIS — Z0000 Encounter for general adult medical examination without abnormal findings: Secondary | ICD-10-CM | POA: Insufficient documentation

## 2011-11-16 NOTE — Assessment & Plan Note (Signed)
Interval medical history is unremarkable. Physical exam is normal. Lab results OK with LDL above goal. No evidence of diabetes is found. He reports having had colonoscopy in the last 5 years - records requested from [facility]. Immunizations: Tdap given today.  In summary - a nice man on the brink of retiring who is medically stable. He is asked to consider aggressive stroke risk reduction: better control of lipids and blood pressure.  He will report back on Blood pressure monitoring. He should return for follow-up in 6 months.

## 2011-11-16 NOTE — Assessment & Plan Note (Signed)
Has maintained sobriety but is not completely abstemious.

## 2011-11-16 NOTE — Assessment & Plan Note (Signed)
Stable with no signs of recurrence.  Plan - maximize risk reduction: control of lipids, blood pressure and daily aspirin.

## 2011-11-16 NOTE — Assessment & Plan Note (Signed)
Labs reveal that LDL is above goal for a patient with known cerebrovascular disease of 80 or less while off medication. Previously on crestor with an LDL of 47.  Plan - best risk reduction in regard to recurrent events is an LDL of less than 80. If crestor was well tolerated than resuming medication is recommended.  Note: current med list includes simvastatin 40 mg daily and gemfibrozil 600 mg bid.

## 2011-11-16 NOTE — Assessment & Plan Note (Signed)
BP Readings from Last 3 Encounters:  11/15/11 140/90  10/11/11 122/74  05/03/11 152/90   Mixed control. Greatest risk reduction is with BP consistently under control: 130s/80s.  Plan- home monitoring and report back if SBP 140+ on a regular basis for medication adjustment.

## 2012-02-10 ENCOUNTER — Other Ambulatory Visit: Payer: Self-pay | Admitting: *Deleted

## 2012-02-10 MED ORDER — ROSUVASTATIN CALCIUM 5 MG PO TABS: 5.0000 mg | ORAL_TABLET | Freq: Every day | ORAL | Status: DC

## 2012-02-10 NOTE — Telephone Encounter (Signed)
Rx crestor [facility] supply [facility]/

## 2012-04-06 ENCOUNTER — Telehealth: Payer: Self-pay

## 2012-04-06 MED ORDER — TADALAFIL 5 MG PO TABS: 5.0000 mg | ORAL_TABLET | Freq: Every day | ORAL | Status: DC | PRN

## 2012-04-06 NOTE — Telephone Encounter (Signed)
Pt called requesting a new Rx for Cialis with a reduced dosage of 5 mg, okay to change?

## 2012-04-06 NOTE — Telephone Encounter (Signed)
cialis 5mg  is designed to be a daily dose, #30 refill x 5

## 2012-04-17 ENCOUNTER — Telehealth: Payer: Self-pay | Admitting: Internal Medicine

## 2012-04-17 DIAGNOSIS — E785 Hyperlipidemia, unspecified: Secondary | ICD-10-CM

## 2012-04-17 NOTE — Telephone Encounter (Signed)
Caller: [name]/Patient; Phone Number: [phone]; Message from caller: Calling to get message to Amy, Dr.  Alvera Novel nurse.  Is having surgery at [facility] August.  23.  Needs Medical Clearance from Dr. Filbert Schilder.  Surgeon's office has emailed/faxed request on 07/19, but [name] wants to make sure this is done quickly.  Requests to have Amy call him and let him know when completed.  Thanks.

## 2012-04-17 NOTE — Telephone Encounter (Signed)
Medical clearance for surgery done.

## 2012-04-18 MED ORDER — ATORVASTATIN CALCIUM 20 MG PO TABS: 20.0000 mg | ORAL_TABLET | Freq: Every day | ORAL | Status: DC

## 2012-04-18 NOTE — Telephone Encounter (Signed)
Ok for lipitor 20 mg once a day. Please change med list, call in RX and enter labs Lipid, hepatic for 4 weeks after change in medication.

## 2012-04-18 NOTE — Telephone Encounter (Signed)
Pt called stating that once he turns 66 next month, his insurance will change and the new p,plan does not cover Crestor. Pt is requesting Rx change to Lipitor, please advise.

## 2012-04-18 NOTE — Telephone Encounter (Signed)
Patient notified that medical clearance for surgery was done by Dr.Norins. Left message on answer machine of this at CB#.Marland Kitchen Patient also request for insurance reason to change crestor to RX of lipitor that his insurance will cover. Please advise

## 2012-04-18 NOTE — Telephone Encounter (Signed)
Medication lipitor sent to [facility] pharmacy. D/C crestor due to insurance. Patient notified of this and labs to be done in 4 weeks

## 2012-04-26 ENCOUNTER — Other Ambulatory Visit: Payer: Self-pay | Admitting: Internal Medicine

## 2012-05-02 ENCOUNTER — Other Ambulatory Visit: Payer: Self-pay | Admitting: *Deleted

## 2012-05-02 MED ORDER — CLOPIDOGREL BISULFATE 75 MG PO TABS: 75.0000 mg | ORAL_TABLET | Freq: Every day | ORAL | Status: DC

## 2012-05-02 NOTE — Telephone Encounter (Signed)
Prior authorization approval sent to [facility] pharmacy for medication Cialis

## 2012-05-24 ENCOUNTER — Ambulatory Visit (HOSPITAL_COMMUNITY)
Admission: RE | Admit: 2012-05-24 | Discharge: 2012-05-24 | Disposition: A | Discharge status: Home / Self Care | Payer: Medicare Other | Source: Ambulatory Visit | Attending: Neurology | Admitting: Neurology

## 2012-05-24 ENCOUNTER — Ambulatory Visit (HOSPITAL_BASED_OUTPATIENT_CLINIC_OR_DEPARTMENT_OTHER)
Admission: RE | Admit: 2012-05-24 | Discharge: 2012-05-24 | Disposition: A | Discharge status: Home / Self Care | Payer: Medicare Other | Source: Ambulatory Visit | Attending: Neurology | Admitting: Neurology

## 2012-05-24 DIAGNOSIS — I6350 Cerebral infarction due to unspecified occlusion or stenosis of unspecified cerebral artery: Secondary | ICD-10-CM

## 2012-05-24 DIAGNOSIS — I639 Cerebral infarction, unspecified: Secondary | ICD-10-CM

## 2012-05-24 DIAGNOSIS — I10 Essential (primary) hypertension: Secondary | ICD-10-CM

## 2012-05-24 DIAGNOSIS — E785 Hyperlipidemia, unspecified: Secondary | ICD-10-CM

## 2012-05-24 DIAGNOSIS — Z0000 Encounter for general adult medical examination without abnormal findings: Secondary | ICD-10-CM

## 2012-05-24 DIAGNOSIS — G459 Transient cerebral ischemic attack, unspecified: Secondary | ICD-10-CM | POA: Insufficient documentation

## 2012-05-24 NOTE — Progress Notes (Signed)
TCD completed. 

## 2012-05-24 NOTE — Progress Notes (Signed)
Bilateral:  No evidence of hemodynamically significant internal carotid artery stenosis.   Vertebral artery flow is antegrade.     

## 2012-06-01 ENCOUNTER — Encounter (HOSPITAL_COMMUNITY): Payer: Self-pay | Admitting: Pharmacy Technician

## 2012-06-06 ENCOUNTER — Encounter (HOSPITAL_COMMUNITY): Payer: Self-pay

## 2012-06-06 ENCOUNTER — Encounter (HOSPITAL_COMMUNITY)
Admission: RE | Admit: 2012-06-06 | Discharge: 2012-06-06 | Disposition: A | Discharge status: Home / Self Care | Payer: Medicare Other | Source: Ambulatory Visit | Attending: Orthopedic Surgery | Admitting: Orthopedic Surgery

## 2012-06-06 HISTORY — DX: Unspecified osteoarthritis, unspecified site: M19.90

## 2012-06-06 HISTORY — DX: Personal history of urinary calculi: Z87.442

## 2012-06-06 LAB — BASIC METABOLIC PANEL
Calcium: 9.9 mg/dL (ref 8.4–10.5)
Chloride: 98 mEq/L (ref 96–112)
Creatinine, Ser: 0.66 mg/dL (ref 0.50–1.35)
GFR calc Af Amer: >90 mL/min (ref >90)
Sodium: 137 mEq/L (ref 135–145)

## 2012-06-06 LAB — SURGICAL PCR SCREEN
MRSA, PCR: NEGATIVE
Staphylococcus aureus: NEGATIVE

## 2012-06-06 LAB — CBC
Platelets: 186 10*3/uL (ref 150–400)
RDW: 12.1 % (ref 11.5–15.5)
WBC: 7.6 10*3/uL (ref 4.0–10.5)

## 2012-06-06 NOTE — Pre-Procedure Instructions (Signed)
20 [name]  06/06/2012   Your procedure is scheduled on:  Thursday, Sept 12  Report to [facility] at 0730 AM.  Call this number if you have problems the morning of surgery: [phone]   Remember:   Do not eat food:After Midnight.  May have clear liquids:until Midnight .    Take these medicines the morning of surgery with A SIP OF WATER: *Atenolol**   Do not wear jewelry, make-up or nail polish.  Do not wear lotions, powders, or perfumes. You may wear deodorant.  Do not shave 48 hours prior to surgery. Men may shave face and neck.  Do not bring valuables to the hospital.  Contacts, dentures or bridgework may not be worn into surgery.  Leave suitcase in the car. After surgery it may be brought to your room.  For patients admitted to the hospital, checkout time is 11:00 AM the day of discharge.   Patients discharged the day of surgery will not be allowed to drive home.  Name and phone number of your driver: Albin Felling, spouse  [phone]  Special Instructions: Incentive Spirometry - Practice and bring it with you on the day of surgery. and CHG Shower Use Special Wash: 1/2 bottle night before surgery and 1/2 bottle morning of surgery.   Please read over the following fact sheets that you were given: Pain Booklet, Coughing and Deep Breathing, MRSA Information and Surgical Site Infection Prevention

## 2012-06-07 MED ORDER — DEXTROSE 5 % IV SOLN
3.0000 g | INTRAVENOUS | Status: AC
  Administered 2012-06-08: 3 g via INTRAVENOUS
  Filled 2012-06-07: qty 3000

## 2012-06-08 ENCOUNTER — Encounter (HOSPITAL_COMMUNITY): Payer: Self-pay | Admitting: *Deleted

## 2012-06-08 ENCOUNTER — Ambulatory Visit (HOSPITAL_COMMUNITY): Payer: Medicare Other | Admitting: Anesthesiology

## 2012-06-08 ENCOUNTER — Encounter (HOSPITAL_COMMUNITY): Payer: Self-pay | Admitting: Anesthesiology

## 2012-06-08 ENCOUNTER — Ambulatory Visit (HOSPITAL_COMMUNITY)
Admission: RE | Admit: 2012-06-08 | Discharge: 2012-06-08 | Disposition: A | Discharge status: Home / Self Care | Payer: Medicare Other | Source: Ambulatory Visit | Attending: Orthopedic Surgery | Admitting: Orthopedic Surgery

## 2012-06-08 ENCOUNTER — Encounter (HOSPITAL_COMMUNITY)
Admission: RE | Disposition: A | Discharge status: Home / Self Care | Payer: Self-pay | Source: Ambulatory Visit | Attending: Orthopedic Surgery

## 2012-06-08 DIAGNOSIS — I69998 Other sequelae following unspecified cerebrovascular disease: Secondary | ICD-10-CM | POA: Insufficient documentation

## 2012-06-08 DIAGNOSIS — Z01810 Encounter for preprocedural cardiovascular examination: Secondary | ICD-10-CM | POA: Insufficient documentation

## 2012-06-08 DIAGNOSIS — M19019 Primary osteoarthritis, unspecified shoulder: Secondary | ICD-10-CM | POA: Insufficient documentation

## 2012-06-08 DIAGNOSIS — Z9889 Other specified postprocedural states: Secondary | ICD-10-CM

## 2012-06-08 DIAGNOSIS — Z01818 Encounter for other preprocedural examination: Secondary | ICD-10-CM | POA: Insufficient documentation

## 2012-06-08 DIAGNOSIS — M67919 Unspecified disorder of synovium and tendon, unspecified shoulder: Secondary | ICD-10-CM | POA: Insufficient documentation

## 2012-06-08 DIAGNOSIS — M24119 Other articular cartilage disorders, unspecified shoulder: Secondary | ICD-10-CM | POA: Insufficient documentation

## 2012-06-08 DIAGNOSIS — M719 Bursopathy, unspecified: Secondary | ICD-10-CM | POA: Insufficient documentation

## 2012-06-08 DIAGNOSIS — M25819 Other specified joint disorders, unspecified shoulder: Secondary | ICD-10-CM | POA: Insufficient documentation

## 2012-06-08 DIAGNOSIS — I10 Essential (primary) hypertension: Secondary | ICD-10-CM | POA: Insufficient documentation

## 2012-06-08 DIAGNOSIS — Z01812 Encounter for preprocedural laboratory examination: Secondary | ICD-10-CM | POA: Insufficient documentation

## 2012-06-08 SURGERY — SHOULDER ARTHROSCOPY WITH DISTAL CLAVICLE RESECTION
Anesthesia: General | Site: Shoulder | Laterality: Left | Wound class: Clean

## 2012-06-08 MED ORDER — PROPOFOL 10 MG/ML IV BOLUS
INTRAVENOUS | Status: DC | PRN
  Administered 2012-06-08: 160 mg via INTRAVENOUS

## 2012-06-08 MED ORDER — OXYCODONE-ACETAMINOPHEN 5-325 MG PO TABS: 1.0000 | ORAL_TABLET | ORAL | Status: AC | PRN

## 2012-06-08 MED ORDER — ONDANSETRON HCL 4 MG/2ML IJ SOLN
INTRAMUSCULAR | Status: DC | PRN
  Administered 2012-06-08: 4 mg via INTRAVENOUS

## 2012-06-08 MED ORDER — DROPERIDOL 2.5 MG/ML IJ SOLN
INTRAMUSCULAR | Status: DC | PRN
  Administered 2012-06-08: 0.625 mg via INTRAVENOUS

## 2012-06-08 MED ORDER — TEMAZEPAM 30 MG PO CAPS: 30.0000 mg | ORAL_CAPSULE | Freq: Every evening | ORAL | Status: DC | PRN

## 2012-06-08 MED ORDER — CYCLOBENZAPRINE HCL 10 MG PO TABS: 10.0000 mg | ORAL_TABLET | Freq: Three times a day (TID) | ORAL | Status: AC | PRN

## 2012-06-08 MED ORDER — MIDAZOLAM HCL 2 MG/2ML IJ SOLN
1.0000 mg | INTRAMUSCULAR | Status: DC | PRN
  Administered 2012-06-08: 2 mg via INTRAVENOUS

## 2012-06-08 MED ORDER — NEOSTIGMINE METHYLSULFATE 1 MG/ML IJ SOLN
INTRAMUSCULAR | Status: DC | PRN
  Administered 2012-06-08: 5 mg via INTRAVENOUS

## 2012-06-08 MED ORDER — FENTANYL CITRATE 0.05 MG/ML IJ SOLN
50.0000 ug | INTRAMUSCULAR | Status: DC | PRN
  Administered 2012-06-08: 100 ug via INTRAVENOUS

## 2012-06-08 MED ORDER — ROCURONIUM BROMIDE 100 MG/10ML IV SOLN
INTRAVENOUS | Status: DC | PRN
  Administered 2012-06-08: 50 mg via INTRAVENOUS

## 2012-06-08 MED ORDER — LACTATED RINGERS IV SOLN
INTRAVENOUS | Status: DC | PRN
  Administered 2012-06-08 (×2): via INTRAVENOUS

## 2012-06-08 MED ORDER — ACETAMINOPHEN 10 MG/ML IV SOLN
INTRAVENOUS | Status: AC
  Filled 2012-06-08: qty 100

## 2012-06-08 MED ORDER — LACTATED RINGERS IV SOLN
INTRAVENOUS | Status: DC
  Administered 2012-06-08: 50 mL/h via INTRAVENOUS

## 2012-06-08 MED ORDER — SUFENTANIL CITRATE 50 MCG/ML IV SOLN
INTRAVENOUS | Status: DC | PRN
  Administered 2012-06-08 (×3): 10 ug via INTRAVENOUS

## 2012-06-08 MED ORDER — CHLORHEXIDINE GLUCONATE 4 % EX LIQD: 60.0000 mL | Freq: Once | CUTANEOUS | Status: DC

## 2012-06-08 MED ORDER — GLYCOPYRROLATE 0.2 MG/ML IJ SOLN
INTRAMUSCULAR | Status: DC | PRN
  Administered 2012-06-08: .6 mg via INTRAVENOUS

## 2012-06-08 MED ORDER — ACETAMINOPHEN 10 MG/ML IV SOLN
INTRAVENOUS | Status: DC | PRN
  Administered 2012-06-08: 1000 mg via INTRAVENOUS

## 2012-06-08 MED ORDER — BUPIVACAINE-EPINEPHRINE PF 0.5-1:200000 % IJ SOLN
INTRAMUSCULAR | Status: DC | PRN
  Administered 2012-06-08: 30 mL

## 2012-06-08 MED ORDER — SODIUM CHLORIDE 0.9 % IR SOLN
Status: DC | PRN
  Administered 2012-06-08: 1000 mL
  Administered 2012-06-08: 6000 mL

## 2012-06-08 MED ORDER — MIDAZOLAM HCL 2 MG/2ML IJ SOLN
INTRAMUSCULAR | Status: AC
  Filled 2012-06-08: qty 2

## 2012-06-08 MED ORDER — FENTANYL CITRATE 0.05 MG/ML IJ SOLN
INTRAMUSCULAR | Status: AC
  Filled 2012-06-08: qty 2

## 2012-06-08 MED ORDER — MIDAZOLAM HCL 5 MG/5ML IJ SOLN
INTRAMUSCULAR | Status: DC | PRN
  Administered 2012-06-08 (×2): 1 mg via INTRAVENOUS

## 2012-06-08 MED ORDER — EPHEDRINE SULFATE 50 MG/ML IJ SOLN
INTRAMUSCULAR | Status: DC | PRN
  Administered 2012-06-08: 5 mg via INTRAVENOUS

## 2012-06-08 MED ORDER — NAPROXEN 500 MG PO TABS: 500.0000 mg | ORAL_TABLET | Freq: Two times a day (BID) | ORAL | Status: DC

## 2012-06-08 SURGICAL SUPPLY — 66 items
ANCH SUT 2 FT CRKSW 14.7X5.5 (Anchor) ×1 IMPLANT
ANCHOR CORKSCREW FIBER 5.5X15 (Anchor) ×1 IMPLANT
BLADE CUTTER GATOR 3.5 (BLADE) ×2 IMPLANT
BLADE GREAT WHITE 4.2 (BLADE) ×2 IMPLANT
BLADE SURG 11 STRL SS (BLADE) ×2 IMPLANT
BOOTCOVER CLEANROOM LRG (PROTECTIVE WEAR) ×4 IMPLANT
BUR 3.5 LG SPHERICAL (BURR) IMPLANT
BUR OVAL 4.0 (BURR) ×2 IMPLANT
BURR 3.5 LG SPHERICAL (BURR)
CANISTER SUCT LVC 12 LTR MEDI- (MISCELLANEOUS) ×2 IMPLANT
CANNULA ACUFLEX KIT 5X76 (CANNULA) ×2 IMPLANT
CANNULA DRILOCK 5.0X75 (CANNULA) ×3 IMPLANT
CLOTH BEACON ORANGE TIMEOUT ST (SAFETY) ×2 IMPLANT
CLSR STERI-STRIP ANTIMIC 1/2X4 (GAUZE/BANDAGES/DRESSINGS) ×1 IMPLANT
CONNECTOR 5 IN 1 STRAIGHT STRL (MISCELLANEOUS) IMPLANT
DRAPE INCISE 23X17 IOBAN STRL (DRAPES) ×1
DRAPE INCISE 23X17 STRL (DRAPES) ×1 IMPLANT
DRAPE INCISE IOBAN 23X17 STRL (DRAPES) ×1 IMPLANT
DRAPE INCISE IOBAN 66X45 STRL (DRAPES) ×2 IMPLANT
DRAPE STERI 35X30 U-POUCH (DRAPES) ×2 IMPLANT
DRAPE SURG 17X11 SM STRL (DRAPES) ×2 IMPLANT
DRAPE U-SHAPE 47X51 STRL (DRAPES) ×2 IMPLANT
DRSG PAD ABDOMINAL 8X10 ST (GAUZE/BANDAGES/DRESSINGS) ×2 IMPLANT
DURAPREP 26ML APPLICATOR (WOUND CARE) ×4 IMPLANT
ELECT REM PT RETURN 9FT ADLT (ELECTROSURGICAL) ×2
ELECTRODE REM PT RTRN 9FT ADLT (ELECTROSURGICAL) ×1 IMPLANT
GLOVE BIO SURGEON STRL SZ7.5 (GLOVE) ×2 IMPLANT
GLOVE BIO SURGEON STRL SZ8 (GLOVE) ×2 IMPLANT
GLOVE BIO SURGEON STRL SZ8.5 (GLOVE) ×1 IMPLANT
GLOVE BIOGEL PI IND STRL 8 (GLOVE) IMPLANT
GLOVE BIOGEL PI INDICATOR 8 (GLOVE) ×1
GLOVE EUDERMIC 7 POWDERFREE (GLOVE) ×2 IMPLANT
GLOVE SS BIOGEL STRL SZ 7.5 (GLOVE) ×1 IMPLANT
GLOVE SUPERSENSE BIOGEL SZ 7.5 (GLOVE) ×1
GLOVE SURG SS PI 8.0 STRL IVOR (GLOVE) ×1 IMPLANT
GLOVE SURG SS PI 8.5 STRL IVOR (GLOVE) ×1
GLOVE SURG SS PI 8.5 STRL STRW (GLOVE) IMPLANT
GOWN STRL NON-REIN LRG LVL3 (GOWN DISPOSABLE) ×2 IMPLANT
GOWN STRL REIN XL XLG (GOWN DISPOSABLE) ×8 IMPLANT
KIT BASIN OR (CUSTOM PROCEDURE TRAY) ×2 IMPLANT
KIT ROOM TURNOVER OR (KITS) ×2 IMPLANT
KIT SHOULDER TRACTION (DRAPES) ×2 IMPLANT
MANIFOLD NEPTUNE II (INSTRUMENTS) ×2 IMPLANT
NDL SPNL 18GX3.5 QUINCKE PK (NEEDLE) ×1 IMPLANT
NDL SUT 6 .5 CRC .975X.05 MAYO (NEEDLE) IMPLANT
NEEDLE MAYO TAPER (NEEDLE)
NEEDLE SPNL 18GX3.5 QUINCKE PK (NEEDLE) ×2 IMPLANT
NS IRRIG 1000ML POUR BTL (IV SOLUTION) ×2 IMPLANT
PACK SHOULDER (CUSTOM PROCEDURE TRAY) ×2 IMPLANT
PAD ARMBOARD 7.5X6 YLW CONV (MISCELLANEOUS) ×4 IMPLANT
SET ARTHROSCOPY TUBING (MISCELLANEOUS) ×2
SET ARTHROSCOPY TUBING LN (MISCELLANEOUS) ×1 IMPLANT
SLING ARM FOAM STRAP LRG (SOFTGOODS) ×1 IMPLANT
SLING ARM FOAM STRAP MED (SOFTGOODS) ×1 IMPLANT
SPONGE GAUZE 4X4 12PLY (GAUZE/BANDAGES/DRESSINGS) ×1 IMPLANT
SPONGE LAP 4X18 X RAY DECT (DISPOSABLE) IMPLANT
STRIP CLOSURE SKIN 1/2X4 (GAUZE/BANDAGES/DRESSINGS) ×1 IMPLANT
SUT MNCRL AB 3-0 PS2 18 (SUTURE) ×2 IMPLANT
SUT PDS AB 0 CT 36 (SUTURE) ×1 IMPLANT
SUT RETRIEVER GRASP 30 DEG (SUTURE) ×1 IMPLANT
SYR 20CC LL (SYRINGE) ×2 IMPLANT
TAPE PAPER 3X10 WHT MICROPORE (GAUZE/BANDAGES/DRESSINGS) ×1 IMPLANT
TOWEL OR 17X24 6PK STRL BLUE (TOWEL DISPOSABLE) ×2 IMPLANT
TOWEL OR 17X26 10 PK STRL BLUE (TOWEL DISPOSABLE) ×2 IMPLANT
WAND SUCTION MAX 4MM 90S (SURGICAL WAND) ×2 IMPLANT
WATER STERILE IRR 1000ML POUR (IV SOLUTION) ×2 IMPLANT

## 2012-06-08 NOTE — Anesthesia Procedure Notes (Signed)
Anesthesia Regional Block:  Interscalene brachial plexus block  Pre-Anesthetic Checklist: ,, timeout performed, Correct Patient, Correct Site, Correct Laterality, Correct Procedure, Correct Position, site marked, Risks and benefits discussed,  Surgical consent,  Pre-op evaluation,  At surgeon's request and post-op pain management  Laterality: Right  Prep: chloraprep       Needles:  Injection technique: Single-shot  Needle Type: Stimulator Needle - 40     Needle Length: 4cm  Needle Gauge: 22 and 22 G    Additional Needles:  Procedures: nerve stimulator Interscalene brachial plexus block  Nerve Stimulator or Paresthesia:  Response: forearm twitch, 0.45 mA, 0.1 ms,   Additional Responses:   Narrative:  Start time: 06/08/2012 9:16 AM End time: 06/08/2012 9:27 AM Injection made incrementally with aspirations every 5 mL.  Events: other event  Performed by: Personally  Anesthesiologist: Sandford Craze, MD  Additional Notes: Pt identified in Holding room.  Monitors applied. Working IV access confirmed. Sterile prep R neck.  #22ga PNS to forearm twitch at 0.20mA threshold.  30cc 0.5% Bupivacaine with 1:200k epi injected incrementally after negative test dose.  Patient asymptomatic, VSS, no heme aspirated, tolerated well.   Sandford Craze, MD

## 2012-06-08 NOTE — H&P (Signed)
[name]    Chief Complaint: right shoulder impingement HPI: The patient is a 66 y.o. male with chronic right shoulder impingement syndrome  Past Medical History  Diagnosis Date  . Alcohol abuse, unspecified   . Hypertension   . Kidney stone on right side     spontaneously passed  . Stroke     August '11 x 2: L. cerebellar, lateral medulla, ol rd lacunar infarcts;  2nd left pontine infarct.  . Hyperlipidemia     preTx 114, postTx 47 LDL  . H/O renal calculi   . Arthritis     in back and shoulder    Past Surgical History  Procedure Date  . Tonsillectomy 1954  . Lumbar disc surgery 1986    Dr. Emilee Hero  . Tonsillectomy   . Back surgery 1986    Family History  Problem Relation Age of Onset  . Heart disease Father   . Hypertension Father     Social History:  reports that he quit smoking about 33 years ago. His smoking use included Cigarettes. He has a 15 pack-year smoking history. He has never used smokeless tobacco. He reports that he drinks about 1.8 ounces of alcohol per week. He reports that he does not use illicit drugs.  Allergies: No Known Allergies  Medications Prior to Admission  Medication Sig Dispense Refill  . atenolol-chlorthalidone (TENORETIC) 50-25 MG per tablet Take 1 tablet by mouth daily.      . rosuvastatin (CRESTOR) 5 MG tablet Take 5 mg by mouth daily.      . clopidogrel (PLAVIX) 75 MG tablet Take 75 mg by mouth daily.      . naproxen sodium (ANAPROX) 220 MG tablet Take 220 mg by mouth 2 (two) times daily as needed. For pain         Physical Exam: right shoudler with painful arc of motion and impingement as noted at last office visit  Vitals  Temp:  [98.4 F (36.9 C)] 98.4 F (36.9 C) (09/12 0729) Pulse Rate:  [70] 70  (09/12 0729) Resp:  [18] 18  (09/12 0729) BP: (146)/(93) 146/93 mmHg (09/12 0729) SpO2:  [97 %] 97 % (09/12 0729)  Assessment/Plan  Impression: right shoulder impingement  Plan of Action: Procedure(s): SHOULDER  ARTHROSCOPY WITH DISTAL CLAVICLE RESECTION, subacromial decompression  [name] 06/08/2012, 10:09 AM

## 2012-06-08 NOTE — Preoperative (Signed)
Beta Blockers   Reason not to administer Beta Blockers:Not Applicable 

## 2012-06-08 NOTE — Anesthesia Preprocedure Evaluation (Signed)
Anesthesia Evaluation  Patient identified by MRN, date of birth, ID band Patient awake    Reviewed: Allergy & Precautions, H&P , NPO status , Patient's Chart, lab work & pertinent test results, reviewed documented beta blocker date and time   History of Anesthesia Complications Negative for: history of anesthetic complications  Airway Mallampati: I TM Distance: >3 FB Neck ROM: Full    Dental  (+) Teeth Intact, Dental Advisory Given and Caps   Pulmonary neg pulmonary ROS, former smoker (quit 1980's),  breath sounds clear to auscultation  Pulmonary exam normal       Cardiovascular hypertension, Pt. on medications and Pt. on home beta blockers Rhythm:Regular Rate:Normal     Neuro/Psych CVA (R weakness), Residual Symptoms negative psych ROS   GI/Hepatic negative GI ROS, Neg liver ROS,   Endo/Other  negative endocrine ROS  Renal/GU negative Renal ROS     Musculoskeletal   Abdominal   Peds  Hematology   Anesthesia Other Findings   Reproductive/Obstetrics                           Anesthesia Physical Anesthesia Plan  ASA: III  Anesthesia Plan: General   Post-op Pain Management:    Induction: Intravenous  Airway Management Planned: Oral ETT  Additional Equipment:   Intra-op Plan:   Post-operative Plan: Extubation in OR  Informed Consent: I have reviewed the patients History and Physical, chart, labs and discussed the procedure including the risks, benefits and alternatives for the proposed anesthesia with the patient or authorized representative who has indicated his/her understanding and acceptance.   Dental advisory given  Plan Discussed with: Surgeon and CRNA  Anesthesia Plan Comments: (Plan routine monitors, GETA with interscalene block for post op analgesia)        Anesthesia Quick Evaluation

## 2012-06-08 NOTE — Transfer of Care (Signed)
Immediate Anesthesia Transfer of Care Note  Patient: [name]  Procedure(s) Performed: Procedure(s) (LRB) with comments: SHOULDER ARTHROSCOPY WITH DISTAL CLAVICLE RESECTION (Left) - Right Shoulder Arthroscopy with Subacromial Decompression, Distal Clavicle Resection  Patient Location: PACU  Anesthesia Type: MAC combined with regional for post-op pain  Level of Consciousness: awake and alert   Airway & Oxygen Therapy: Patient Spontanous Breathing and Patient connected to face mask oxygen  Post-op Assessment: Report given to PACU RN and Post -op Vital signs reviewed and stable  Post vital signs: Reviewed and stable  Complications: No apparent anesthesia complications

## 2012-06-08 NOTE — Anesthesia Postprocedure Evaluation (Signed)
  Anesthesia Post-op Note  Patient: [name]  Procedure(s) Performed: Procedure(s) (LRB) with comments: SHOULDER ARTHROSCOPY WITH DISTAL CLAVICLE RESECTION (Left) - Right Shoulder Arthroscopy with Subacromial Decompression, Distal Clavicle Resection  Patient Location: PACU  Anesthesia Type: GA combined with regional for post-op pain  Level of Consciousness: awake, alert , oriented and patient cooperative  Airway and Oxygen Therapy: Patient Spontanous Breathing  Post-op Pain: none  Post-op Assessment: Post-op Vital signs reviewed, Patient's Cardiovascular Status Stable, Respiratory Function Stable, Patent Airway, No signs of Nausea or vomiting and Pain level controlled  Post-op Vital Signs: Reviewed and stable  Complications: No apparent anesthesia complications

## 2012-06-08 NOTE — Op Note (Signed)
06/08/2012  12:20 PM  PATIENT:   [name]  66 y.o. male  PRE-OPERATIVE DIAGNOSIS:  right shoulder impingement  POST-OPERATIVE DIAGNOSIS:  Right shoulder impingement, labral tear, rotator cuff tear, AC joint OA  PROCEDURE:  RSA, labral debridement, SAD, DCR, RCR  SURGEON:  Jarick Harkins, Vania Rea M.D.  ASSISTANTS: Shuford pac   ANESTHESIA:   GET + ISB  EBL: min  SPECIMEN:  none  Drains: none   PATIENT DISPOSITION:  PACU - hemodynamically stable.    PLAN OF CARE: Discharge to home after PACU  Dictation# [id number]

## 2012-06-09 NOTE — Op Note (Signed)
[name], [name] NO.:  [id number]  MEDICAL RECORD NO.:  [id number]  LOCATION:  [facility]                         FACILITY:  [facility]  PHYSICIAN:  Vania Rea. Paislie Tessler, M.D.  DATE OF BIRTH:  1946/06/18  DATE OF PROCEDURE:  06/08/2012 DATE OF DISCHARGE:  06/08/2012                              OPERATIVE REPORT   PREOPERATIVE DIAGNOSES: 1. Chronic right shoulder impingement syndrome with possible rotator     cuff tear 2. Right shoulder acromioclavicular joint arthropathy.  POSTOPERATIVE DIAGNOSES: 1. Chronic right shoulder impingement syndrome. 2. Right shoulder rotator cuff tear. 3. Right shoulder acromioclavicular joint arthrosis. 4. Right shoulder labral tear.  PROCEDURES: 1. Right shoulder examination under anesthesia. 2. Right shoulder glenohumeral joint diagnostic arthroscopy. 3. Debridement of extensive degenerative labral tear. 4. Arthroscopic subacromial decompression and bursectomy. 5. Arthroscopic distal clavicle resection. 6. Arthroscopic rotator cuff repair using a single row repair     construct.  SURGEON:  Vania Rea. Khady Vandenberg, MD  ASSISTANT:  Lucita Lora. Shuford, PA-C  ANESTHESIA:  General endotracheal as well as interscalene block.  BLOOD LOSS:  Minimal.  DRAINS:  None.  HISTORY:  [name] is a 66 year old gentleman who has had chronic and progressive increasing right shoulder pain with impingement syndrome and MRI scan suggesting a probable partial rotator cuff tear and confirms AC joint degenerative changes.  Due to his ongoing pain and function limitations, he is brought to the operating room at this time for planned right shoulder arthroscopy as described below.  I preoperatively counseled [name] on treatment options as well as risks versus benefits thereof.  Possible surgical complications were reviewed including the potential for bleeding, infection, neurovascular injury, persistent pain, loss of motion, anesthetic  complication, possible need for additional surgery.  He understands and accepts and agrees with our planned procedure.  PROCEDURE IN DETAIL:  After undergoing routine preop evaluation, the patient received prophylactic antibiotics.  An interscalene block was established in the holding area by the Anesthesia Department.  Placed supine on the op table, underwent smooth induction of a general endotracheal anesthesia.  Turned to left lateral decubitus position on a beanbag and appropriately padded and protected.  Right shoulder examination under anesthesia reveals a moderate restriction mobility with only 140 degrees for elevation and manipulation is performed with no significant increase in mobility.  Right arm was then suspended at 70 degrees of abduction with 10 pounds of traction and the right shoulder girdle region was sterilely prepped and draped in standard fashion. Time-out was called.  Post portal was established in the glenohumeral joint, the anterior portal was established under direct visualization. Glenohumeral articular surfaces were all in excellent condition.  There was noted to be degenerative tearing of the anterior superior and posterior superior aspects of the glenoid labrum and these areas were debrided back to stable margin with the shaver.  Biceps anchor was stable.  Biceps tendon was normal caliber.  The rotator cuff showed at least a partial articular sided rotator cuff tear which was debrided with shaver and passed tag suture of 0 PDS at this point.  Inspection of the glenohumeral joint showed no additional pathologies.  At this point, fluid and instrument was then  removed.  The arm was dropped down to 30 degrees abduction with arthroscope introduced in the subacromial space of the posterior portal and direct lateral portals of the subacromial space.  Abundant dense bursal tissue, multiple adhesions were encountered and these were all divided and excised with  combination of shaver and Stryker wand.  The wand was then used to remove the periosteum from the undersurface of the anterior half of the acromion. The subacromial decompression was performed with a bur creating a type 1 morphology.  Portal was then established directly anterior to the distal clavicle and distal clavicle resection performed with a bur.  Care was taken to confirm visualization of the entire circumference of the distal clavicle to ensure adequate removal of bone.  We then completed the subacromial/subdeltoid bursectomy.  We then probed the bursal surface of the rotator cuff and this did indeed show a small full-thickness defect of the distal supraspinatus.  We went ahead and debrided this area with ultimately having a defect, approximately a cm and a half in width.  The rotator cuff was debrided back to stable and healthy margin with the shaver and then we prepared the tuberosity removing the soft tissue with the Stryker wand using a shaver to abrade the bone.  We then established accessory portal device through a stab in lateral margin of the acromion, placed Arthrex peek corkscrew suture anchor.  The limbs of the suture anchor were then passed equal distance to cross the width of the rotator cuff tear in a horizontal mattress pattern and then tied with sliding locking knots followed by multiple overhand throws and alternating posts.  This allowed excellent reapposition of the free margin of the rotator cuff tear and the bony bed of the tuberosity. Suture limbs were then clipped.  The overall repair construct was much to our satisfaction.  Final hemostasis was obtained.  Bursectomy completed.  Fluid and instrument removed.  Ralene Bathe, PA-C was used as an Geophysicist/field seismologist throughout this case, essential for help with positioning of the extremity, management of the arthroscopic equipment, suture management, tissue manipulation, wound closure and intraoperative decision  making.  The wound was closed with Monocryl and Steri-Strips.  Dry dressing taped at the right shoulder. Right arm was placed in a sling.  The patient was awakened, extubated, and taken to recovery room in stable condition.     Vania Rea. Elvan Ebron, M.D.     KMS/MEDQ  D:  06/08/2012  T:  06/09/2012  Job:  [id number]

## 2012-06-10 HISTORY — PX: ROTATOR CUFF REPAIR: SHX139

## 2012-11-24 ENCOUNTER — Telehealth: Payer: Self-pay | Admitting: Internal Medicine

## 2012-11-24 MED ORDER — ROSUVASTATIN CALCIUM 5 MG PO TABS: 5.0000 mg | ORAL_TABLET | Freq: Every day | ORAL | Status: DC

## 2012-11-24 MED ORDER — CLOPIDOGREL BISULFATE 75 MG PO TABS: 75.0000 mg | ORAL_TABLET | Freq: Every day | ORAL | Status: DC

## 2012-11-24 MED ORDER — ATENOLOL-CHLORTHALIDONE 50-25 MG PO TABS: 1.0000 | ORAL_TABLET | Freq: Every day | ORAL | Status: DC

## 2012-11-24 NOTE — Telephone Encounter (Signed)
Patient requesting written scripts for his 3 medications to be picked up next week so that he can submitt them to his mail order pharmacy, he did not leave the medication names

## 2012-11-24 NOTE — Telephone Encounter (Signed)
Pt requested hard scripts. He is changing to mail order pharmacy and needs hard scripts to send in. Call pt when ready to pick up.

## 2012-12-11 ENCOUNTER — Encounter: Payer: Medicare Other | Admitting: Internal Medicine

## 2012-12-21 ENCOUNTER — Other Ambulatory Visit: Payer: Self-pay | Admitting: *Deleted

## 2012-12-21 ENCOUNTER — Other Ambulatory Visit: Payer: Self-pay | Admitting: Internal Medicine

## 2012-12-21 MED ORDER — ATORVASTATIN CALCIUM 20 MG PO TABS: 20.0000 mg | ORAL_TABLET | Freq: Every day | ORAL | Status: DC

## 2013-01-08 ENCOUNTER — Encounter: Payer: Medicare Other | Admitting: Internal Medicine

## 2013-01-25 ENCOUNTER — Other Ambulatory Visit: Payer: Self-pay | Admitting: Internal Medicine

## 2013-02-20 ENCOUNTER — Ambulatory Visit (INDEPENDENT_AMBULATORY_CARE_PROVIDER_SITE_OTHER): Payer: Medicare Other | Admitting: Internal Medicine

## 2013-02-20 ENCOUNTER — Encounter: Payer: Self-pay | Admitting: Internal Medicine

## 2013-02-20 ENCOUNTER — Other Ambulatory Visit (INDEPENDENT_AMBULATORY_CARE_PROVIDER_SITE_OTHER): Payer: Medicare Other

## 2013-02-20 ENCOUNTER — Other Ambulatory Visit: Payer: Self-pay

## 2013-02-20 VITALS — BP 156/88 | HR 65 | Temp 97.8°F | Ht 73.0 in | Wt 190.0 lb

## 2013-02-20 DIAGNOSIS — M25511 Pain in right shoulder: Secondary | ICD-10-CM

## 2013-02-20 DIAGNOSIS — I6350 Cerebral infarction due to unspecified occlusion or stenosis of unspecified cerebral artery: Secondary | ICD-10-CM

## 2013-02-20 DIAGNOSIS — M25519 Pain in unspecified shoulder: Secondary | ICD-10-CM

## 2013-02-20 DIAGNOSIS — E785 Hyperlipidemia, unspecified: Secondary | ICD-10-CM

## 2013-02-20 DIAGNOSIS — I639 Cerebral infarction, unspecified: Secondary | ICD-10-CM

## 2013-02-20 DIAGNOSIS — I10 Essential (primary) hypertension: Secondary | ICD-10-CM

## 2013-02-20 DIAGNOSIS — F1010 Alcohol abuse, uncomplicated: Secondary | ICD-10-CM

## 2013-02-20 DIAGNOSIS — Z0000 Encounter for general adult medical examination without abnormal findings: Secondary | ICD-10-CM

## 2013-02-20 LAB — HEPATIC FUNCTION PANEL
ALT: 41 U/L (ref 0–53)
Alkaline Phosphatase: 48 U/L (ref 39–117)
Bilirubin, Direct: 0.1 mg/dL (ref 0.0–0.3)
Total Bilirubin: 1 mg/dL (ref 0.3–1.2)

## 2013-02-20 LAB — COMPREHENSIVE METABOLIC PANEL
Albumin: 4.1 g/dL (ref 3.5–5.2)
BUN: 15 mg/dL (ref 6–23)
CO2: 29 mEq/L (ref 19–32)
Calcium: 9.3 mg/dL (ref 8.4–10.5)
GFR: 112.56 mL/min (ref >60.00)
Glucose, Bld: 99 mg/dL (ref 70–99)
Potassium: 3.9 mEq/L (ref 3.5–5.1)
Sodium: 137 mEq/L (ref 135–145)
Total Protein: 7.2 g/dL (ref 6.0–8.3)

## 2013-02-20 LAB — LIPID PANEL
Cholesterol: 142 mg/dL (ref 0–200)
LDL Cholesterol: 61 mg/dL (ref 0–99)
VLDL: 11.8 mg/dL (ref 0.0–40.0)

## 2013-02-20 MED ORDER — NAPROXEN 500 MG PO TABS: 500.0000 mg | ORAL_TABLET | Freq: Two times a day (BID) | ORAL | Status: DC

## 2013-02-20 MED ORDER — ATENOLOL-CHLORTHALIDONE 50-25 MG PO TABS: 1.0000 | ORAL_TABLET | Freq: Every day | ORAL | Status: DC

## 2013-02-20 MED ORDER — ATORVASTATIN CALCIUM 20 MG PO TABS: 20.0000 mg | ORAL_TABLET | Freq: Every day | ORAL | Status: DC

## 2013-02-20 MED ORDER — CLOPIDOGREL BISULFATE 75 MG PO TABS: 75.0000 mg | ORAL_TABLET | Freq: Every day | ORAL | Status: DC

## 2013-02-20 NOTE — Assessment & Plan Note (Signed)
Doing well: good ROM right shoulder. No pain.

## 2013-02-20 NOTE — Assessment & Plan Note (Signed)
BP Readings from Last 3 Encounters:  02/20/13 156/88  06/08/12 117/76  06/08/12 117/76   Generally well controlled. Needs to check BP at home and report back. Continue present meds

## 2013-02-20 NOTE — Assessment & Plan Note (Signed)
Excellent recovery. Still with some gait and balance issues but doing fine - not using assist device and exercising on a regular basis.

## 2013-02-20 NOTE — Assessment & Plan Note (Addendum)
On lipitor w/o compoications.   Plan - follow up lab with recommendations to follow  Addendum: lab reveals very good control with LDL less than 70. OK to take a drug holiday - hold the lipitor for 4 weeks and then have follow up lab. An order for repeat lab is placed.

## 2013-02-20 NOTE — Assessment & Plan Note (Signed)
Maintaining recovery.

## 2013-02-20 NOTE — Patient Instructions (Addendum)
You look great - retirement looks like a good thing. Keep up the good work re: exercise and diet. You will be able to check you lab results on MyChart. Come back in a year.

## 2013-02-20 NOTE — Progress Notes (Signed)
Patient ID: [name], male   DOB: [birth year], 67 y.o.   MRN: [id number] [name] is here for annual Medicare wellness examination and management of other chronic and acute problems. He has been feeling well with no major medical problems. He had right rotator cuff repair las Sept '14 and has done well.   He has retired as of Jan '14: he loves it. He is more involved in his grandchildren, he is exercising regularly and has no stress. He is feeling great.    The risk factors are reflected in the social history.  The roster of all physicians providing medical care to patient - is listed in the Snapshot section of the chart.  Activities of daily living:  The patient is 100% inedpendent in all ADLs: dressing, toileting, feeding as well as independent mobility  Home safety : The patient has smoke detectors in the home. They wear seatbelts.  firearms are present in the home, kept in a safe fashion. There is no violence in the home.   There is no risks for hepatitis, STDs or HIV. There is no  history of blood transfusion. They have no travel history to infectious disease endemic areas of the world.  The patient has seen their dentist in the last six month. They have seen their eye doctor in the last year. They deny any hearing difficulty and have not had audiologic testing in the last year.  They do not  have excessive sun exposure. Discussed the need for sun protection: hats, long sleeves and use of sunscreen if there is significant sun exposure.   Diet: the importance of a healthy diet is discussed. They do have a healthy diet.  The patient has a regular exercise program: aerobic and weight training ,  60 min duration, 3 per week.  The benefits of regular aerobic exercise were discussed.  Depression screen: there are no signs or vegative symptoms of depression- irritability, change in appetite, anhedonia, sadness/tearfullness.  Cognitive assessment: the patient manages all their financial and  personal affairs and is actively engaged.  The following portions of the patient's history were reviewed and updated as appropriate: allergies, current medications, past family history, past medical history,  past surgical history, past social history  and problem list.  Vision, hearing, body mass index were assessed and reviewed.   During the course of the visit the patient was educated and counseled about appropriate screening and preventive services including : fall prevention , diabetes screening, nutrition counseling, colorectal cancer screening, and recommended immunizations.  Current Outpatient Prescriptions on File Prior to Visit  Medication Sig Dispense Refill  . atenolol-chlorthalidone (TENORETIC) 50-25 MG per tablet Take 1 tablet by mouth daily.  90 tablet  0  . atorvastatin (LIPITOR) 20 MG tablet TAKE 1 TABLET EVERY DAY  30 tablet  6  . clopidogrel (PLAVIX) 75 MG tablet Take 1 tablet (75 mg total) by mouth daily.  90 tablet  0  . naproxen (NAPROSYN) 500 MG tablet Take 1 tablet (500 mg total) by mouth 2 (two) times daily with a meal.  60 tablet  1  . temazepam (RESTORIL) 30 MG capsule Take 1 capsule (30 mg total) by mouth at bedtime as needed for sleep.  30 capsule  1   Current Facility-Administered Medications on File Prior to Visit  Medication Dose Route Frequency Provider Last Rate Last Dose  . methylPREDNISolone acetate (DEPO-MEDROL) injection 40 mg  40 mg Intra-articular Once Jacques Navy, MD  Constitutional:  Negative for fever, chills, activity change and unexpected weight change.  HEENT:  Negative for hearing loss, ear pain, congestion, neck stiffness and postnasal drip. Negative for sore throat or swallowing problems. Negative for dental complaints.   Eyes: Negative for vision loss or change in visual acuity.  Respiratory: Negative for chest tightness and wheezing. Negative for DOE.   Cardiovascular: Negative for chest pain or palpitations. No decreased exercise  tolerance Gastrointestinal: No change in bowel habit. No bloating or gas. No reflux or indigestion Genitourinary: Negative for urgency, frequency, flank pain and difficulty urinating.  Musculoskeletal: Negative for myalgias, back pain, arthralgias and gait problem.  Neurological: Negative for dizziness, tremors, positive for right sided weakness, no headaches.  Hematological: Negative for adenopathy.  Psychiatric/Behavioral: Negative for behavioral problems and dysphoric mood.   Filed Vitals:   02/20/13 1001  BP: 156/88  Pulse: 65  Temp: 97.8 F (36.6 C)   Wt Readings from Last 3 Encounters:  02/20/13 190 lb (86.183 kg)  06/06/12 182 lb 3 oz (82.64 kg)  11/15/11 178 lb 4 oz (80.854 kg)   Gen'l: Well nourished well developed  White male in no acute distress  HEENT: Head: Normocephalic and atraumatic. Right Ear: External ear normal. EAC/TM nl. Left Ear: External ear normal.  EAC/TM nl. Nose: Nose normal. Mouth/Throat: Oropharynx is clear and moist. Dentition - native, in good repair. No buccal or palatal lesions. Posterior pharynx clear. Eyes: Conjunctivae and sclera clear. EOM intact. Pupils are equal, round, and reactive to light. Right eye exhibits no discharge. Left eye exhibits no discharge. Neck: Normal range of motion. Neck supple. No JVD present. No tracheal deviation present. No thyromegaly present.  Cardiovascular: Normal rate, regular rhythm, no gallop, no friction rub, no murmur heard.      Quiet precordium. 2+ radial and DP pulses . No carotid bruits Pulmonary/Chest: Effort normal. No respiratory distress or increased WOB, no wheezes, no rales. No chest wall deformity or CVAT. Abdomen: Soft. Bowel sounds are normal in all quadrants. He exhibits no distension, no tenderness, no rebound or guarding, No heptosplenomegaly  Genitourinary:  deferred Musculoskeletal: Normal range of motion. He exhibits no edema and no tenderness.       Small and large joints without redness, synovial  thickening or deformity. Full range of motion preserved about all small, median and large joints.  Lymphadenopathy:    He has no cervical or supraclavicular adenopathy.  Neurological: He is alert and oriented to person, place, and time. CN II-XII intact. DTRs 2+ and symmetrical biceps, radial and patellar tendons. Cerebellar function normal with no tremor, rigidity.  Mild weakness right UE/LE with abnormal gait.  Skin: Skin is warm and dry. No rash noted. No erythema.  Psychiatric: He has a normal mood and affect. His behavior is normal. Thought content normal.   Recent Results (from the past 2160 hour(s))  LIPID PANEL     Status: None   Collection Time    02/20/13 11:20 AM      Result Value Range   Cholesterol 142  0 - 200 mg/dL   Comment: ATP III Classification       Desirable:  < 200 mg/dL               Borderline High:  200 - 239 mg/dL          High:  > = 161 mg/dL   Triglycerides 09.6  0.0 - 149.0 mg/dL   Comment: Normal:  <045 mg/dLBorderline High:  150 - 199  mg/dL   HDL 16.10  >96.04 mg/dL   VLDL 54.0  0.0 - 98.1 mg/dL   LDL Cholesterol 61  0 - 99 mg/dL   Total CHOL/HDL Ratio 2     Comment:                Men          Women1/2 Average Risk     3.4          3.3Average Risk          5.0          4.42X Average Risk          9.6          7.13X Average Risk          15.0          11.0                      HEPATIC FUNCTION PANEL     Status: None   Collection Time    02/20/13 11:20 AM      Result Value Range   Total Bilirubin 1.0  0.3 - 1.2 mg/dL   Bilirubin, Direct 0.1  0.0 - 0.3 mg/dL   Alkaline Phosphatase 48  39 - 117 U/L   AST 36  0 - 37 U/L   ALT 41  0 - 53 U/L   Total Protein 7.2  6.0 - 8.3 g/dL   Albumin 4.1  3.5 - 5.2 g/dL  COMPREHENSIVE METABOLIC PANEL     Status: None   Collection Time    02/20/13 11:20 AM      Result Value Range   Sodium 137  135 - 145 mEq/L   Potassium 3.9  3.5 - 5.1 mEq/L   Chloride 99  96 - 112 mEq/L   CO2 29  19 - 32 mEq/L   Glucose, Bld 99  70  - 99 mg/dL   BUN 15  6 - 23 mg/dL   Creatinine, Ser 0.7  0.4 - 1.5 mg/dL   Total Bilirubin 1.0  0.3 - 1.2 mg/dL   Alkaline Phosphatase 48  39 - 117 U/L   AST 36  0 - 37 U/L   ALT 41  0 - 53 U/L   Total Protein 7.2  6.0 - 8.3 g/dL   Albumin 4.1  3.5 - 5.2 g/dL   Calcium 9.3  8.4 - 19.1 mg/dL   GFR 478.29  >56.21 mL/min

## 2013-02-21 NOTE — Assessment & Plan Note (Signed)
Interval history is negative. PHysical exam is normal except for right sided weakness. Lab reviewed. Patient is current with colorectal cancer screening, immunizations and prostatec screening with normal PSA '13.  In summary - a very nice man who is doing well. He will return in 1 year or sooner as needed.

## 2013-05-03 ENCOUNTER — Other Ambulatory Visit: Payer: Self-pay | Admitting: Internal Medicine

## 2013-07-25 ENCOUNTER — Telehealth: Payer: Self-pay | Admitting: Neurology

## 2013-07-25 NOTE — Telephone Encounter (Signed)
Patient is having right side problems, harder to walk, right foot numbness, tingling, noticeable limp when walking a distance. 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

## 2013-07-25 NOTE — Telephone Encounter (Signed)
Told patient that since he has not been seen in our office since 2012, he should contact his pcp and then have him refer back to [place] if needed, patient understood and said that he would.

## 2013-07-30 ENCOUNTER — Telehealth: Payer: Self-pay | Admitting: Internal Medicine

## 2013-07-30 NOTE — Telephone Encounter (Signed)
Patient Information:  Caller Name: Drayk  Phone: [phone]  Patient: [name], [name]  Gender: Male  DOB: 1946/04/22  Age: 67 Years  PCP: Illene Regulus (Adults only)  Office Follow Up:  Does the office need to follow up with this patient?: No  Instructions For The Office: N/A   Symptoms  Reason For Call & Symptoms: Pt states he has numbness in the right foot.  Pt also reports dizziness if getting up to urinate in the night.  Reviewed Health History In EMR: Yes  Reviewed Medications In EMR: Yes  Reviewed Allergies In EMR: Yes  Reviewed Surgeries / Procedures: Yes  Date of Onset of Symptoms: 05/28/2013  Guideline(s) Used:  Dizziness  Disposition Per Guideline:   See Within 2 Weeks in Office  Reason For Disposition Reached:   Dizziness not present now, but is a chronic symptom (recurrent or ongoing AND lasting > 4 weeks)  Advice Given:  Call Back If:  You become worse.  Temporary Dizziness  is usually a harmless symptom. It can be caused by not drinking enough water during sports or hot weather. It can also be caused by skipping a meal, too much sun exposure, standing up suddenly, standing too long in one place or even a viral illness.  Temporary Dizziness  is usually a harmless symptom. It can be caused by not drinking enough water during sports or hot weather. It can also be caused by skipping a meal, too much sun exposure, standing up suddenly, standing too long in one place or even a viral illness.  Some Causes of Temporary Dizziness:  Poor Fluid Intake - Not drinking enough fluids and being a little dehydrated is a common cause of temporary dizziness. This is always worse during hot weather.  Standing Up Suddenly - Standing up suddenly (especially getting out of bed) or prolonged standing in one place are common causes of temporary dizziness. Not drinking enough fluids always makes it worse. Certain medications can cause or increase this type of dizziness (e.g., blood  pressure medications).  Drink Fluids:  Drink several glasses of fruit juice, other clear fluids, or water. This will improve hydration and blood glucose. If you have a fever or have had heat exposure, make sure the fluids are cold.  Call Back If:  Still feel dizzy after 2 hours of rest and fluids  Passes out (faints)  You become worse.  Patient Will Follow Care Advice:  YES  Appointment Scheduled:  08/01/2013 11:00:00 Appointment Scheduled Provider:  Illene Regulus (Adults only)

## 2013-08-01 ENCOUNTER — Ambulatory Visit: Payer: Self-pay | Admitting: Internal Medicine

## 2013-10-16 ENCOUNTER — Telehealth: Payer: Self-pay | Admitting: Internal Medicine

## 2013-10-16 MED ORDER — SILDENAFIL CITRATE 100 MG PO TABS: 100.0000 mg | ORAL_TABLET | ORAL | Status: DC | PRN

## 2013-10-16 NOTE — Telephone Encounter (Signed)
Pt wants a call from [place]. He said it is personal and would not give details.

## 2013-10-16 NOTE — Telephone Encounter (Signed)
Phone call to patient requesting a script for Viagra be sent to [facility] on [address]. He states Cialis is not working. Please advise.

## 2013-10-16 NOTE — Telephone Encounter (Signed)
Ok viagra 100 mg #6 refill x 11, take as needed

## 2013-10-16 NOTE — Telephone Encounter (Signed)
Viagra has been sent

## 2013-11-22 ENCOUNTER — Ambulatory Visit: Payer: Medicare Other | Admitting: Internal Medicine

## 2014-02-19 ENCOUNTER — Ambulatory Visit (INDEPENDENT_AMBULATORY_CARE_PROVIDER_SITE_OTHER): Payer: Commercial Managed Care - HMO | Admitting: Internal Medicine

## 2014-02-19 ENCOUNTER — Encounter: Payer: Self-pay | Admitting: Internal Medicine

## 2014-02-19 ENCOUNTER — Other Ambulatory Visit (INDEPENDENT_AMBULATORY_CARE_PROVIDER_SITE_OTHER): Payer: Commercial Managed Care - HMO

## 2014-02-19 VITALS — BP 120/76 | HR 64 | Temp 98.1°F | Resp 16 | Ht 73.0 in | Wt 193.0 lb

## 2014-02-19 DIAGNOSIS — I10 Essential (primary) hypertension: Secondary | ICD-10-CM

## 2014-02-19 DIAGNOSIS — E785 Hyperlipidemia, unspecified: Secondary | ICD-10-CM

## 2014-02-19 DIAGNOSIS — I639 Cerebral infarction, unspecified: Secondary | ICD-10-CM

## 2014-02-19 DIAGNOSIS — N400 Enlarged prostate without lower urinary tract symptoms: Secondary | ICD-10-CM

## 2014-02-19 DIAGNOSIS — Z86010 Personal history of colonic polyps: Secondary | ICD-10-CM | POA: Insufficient documentation

## 2014-02-19 DIAGNOSIS — Z23 Encounter for immunization: Secondary | ICD-10-CM

## 2014-02-19 DIAGNOSIS — Z0000 Encounter for general adult medical examination without abnormal findings: Secondary | ICD-10-CM

## 2014-02-19 DIAGNOSIS — I6350 Cerebral infarction due to unspecified occlusion or stenosis of unspecified cerebral artery: Secondary | ICD-10-CM

## 2014-02-19 LAB — URINALYSIS, ROUTINE W REFLEX MICROSCOPIC
BILIRUBIN URINE: NEGATIVE
HGB URINE DIPSTICK: NEGATIVE
Ketones, ur: NEGATIVE
Leukocytes, UA: NEGATIVE
NITRITE: NEGATIVE
RBC / HPF: NONE SEEN (ref 0–?)
Specific Gravity, Urine: 1.015 (ref 1.000–1.030)
Total Protein, Urine: NEGATIVE
UROBILINOGEN UA: 1 (ref 0.0–1.0)
Urine Glucose: NEGATIVE
WBC UA: NONE SEEN (ref 0–?)
pH: 7.5 (ref 5.0–8.0)

## 2014-02-19 LAB — COMPREHENSIVE METABOLIC PANEL
ALBUMIN: 4 g/dL (ref 3.5–5.2)
ALT: 36 U/L (ref 0–53)
AST: 34 U/L (ref 0–37)
Alkaline Phosphatase: 55 U/L (ref 39–117)
BUN: 15 mg/dL (ref 6–23)
CO2: 29 mEq/L (ref 19–32)
Calcium: 9.3 mg/dL (ref 8.4–10.5)
Chloride: 102 mEq/L (ref 96–112)
Creatinine, Ser: 0.7 mg/dL (ref 0.4–1.5)
GFR: 117.71 mL/min (ref >60.00)
Glucose, Bld: 96 mg/dL (ref 70–99)
POTASSIUM: 4.1 meq/L (ref 3.5–5.1)
Sodium: 139 mEq/L (ref 135–145)
Total Bilirubin: 1 mg/dL (ref 0.2–1.2)
Total Protein: 7 g/dL (ref 6.0–8.3)

## 2014-02-19 LAB — PSA: PSA: 1.22 ng/mL (ref 0.10–4.00)

## 2014-02-19 LAB — CBC WITH DIFFERENTIAL/PLATELET
Basophils Absolute: 0 10*3/uL (ref 0.0–0.1)
Basophils Relative: 0.4 % (ref 0.0–3.0)
EOS PCT: 1.9 % (ref 0.0–5.0)
Eosinophils Absolute: 0.1 10*3/uL (ref 0.0–0.7)
HEMATOCRIT: 44.2 % (ref 39.0–52.0)
HEMOGLOBIN: 15.1 g/dL (ref 13.0–17.0)
LYMPHS ABS: 2.1 10*3/uL (ref 0.7–4.0)
Lymphocytes Relative: 33.6 % (ref 12.0–46.0)
MCHC: 34.2 g/dL (ref 30.0–36.0)
MCV: 93.2 fl (ref 78.0–100.0)
Monocytes Absolute: 0.6 10*3/uL (ref 0.1–1.0)
Monocytes Relative: 8.9 % (ref 3.0–12.0)
NEUTROS PCT: 55.2 % (ref 43.0–77.0)
Neutro Abs: 3.4 10*3/uL (ref 1.4–7.7)
Platelets: 199 10*3/uL (ref 150.0–400.0)
RBC: 4.74 Mil/uL (ref 4.22–5.81)
RDW: 12.9 % (ref 11.5–15.5)
WBC: 6.2 10*3/uL (ref 4.0–10.5)

## 2014-02-19 LAB — LIPID PANEL
CHOL/HDL RATIO: 2
Cholesterol: 154 mg/dL (ref 0–200)
HDL: 68.5 mg/dL (ref >39.00)
LDL CALC: 77 mg/dL (ref 0–99)
Triglycerides: 45 mg/dL (ref 0.0–149.0)
VLDL: 9 mg/dL (ref 0.0–40.0)

## 2014-02-19 LAB — TSH: TSH: 1.48 u[IU]/mL (ref 0.35–4.50)

## 2014-02-19 NOTE — Assessment & Plan Note (Signed)
His BP is well controlled I will check his lytes and renal function today 

## 2014-02-19 NOTE — Assessment & Plan Note (Signed)
Stable

## 2014-02-19 NOTE — Assessment & Plan Note (Signed)
I will check his PSA to screen for cancer He has no s/s that need to be treated

## 2014-02-19 NOTE — Assessment & Plan Note (Signed)
I will check his FLP today and will advise further

## 2014-02-19 NOTE — Progress Notes (Signed)
Pre visit review using our clinic review tool, if applicable. No additional management support is needed unless otherwise documented below in the visit note. 

## 2014-02-19 NOTE — Assessment & Plan Note (Signed)
He is due for a f/up colonoscopy

## 2014-02-19 NOTE — Assessment & Plan Note (Addendum)

## 2014-02-19 NOTE — Progress Notes (Signed)
Subjective:    Patient ID: [name], male    DOB: 29-Nov-1945, 68 y.o.   MRN: [id number]  Hypertension This is a chronic problem. The current episode started more than 1 year ago. The problem has been gradually improving since onset. The problem is controlled. Pertinent negatives include no anxiety, blurred vision, chest pain, headaches, neck pain, orthopnea, palpitations, peripheral edema, PND, shortness of breath or sweats. Agents associated with hypertension include NSAIDs. Past treatments include beta blockers and diuretics. Hypertensive end-organ damage includes CVA.      Review of Systems  Constitutional: Negative for fever, chills, diaphoresis, appetite change and fatigue.  HENT: Negative.   Eyes: Negative.  Negative for blurred vision.  Respiratory: Negative.  Negative for cough, choking, chest tightness, shortness of breath, wheezing and stridor.   Cardiovascular: Negative.  Negative for chest pain, palpitations, orthopnea, leg swelling and PND.  Gastrointestinal: Negative.  Negative for vomiting, abdominal pain, diarrhea, constipation and blood in stool.  Endocrine: Negative.   Genitourinary: Negative.   Musculoskeletal: Positive for gait problem (mild ataxia s/p CVA). Negative for arthralgias, back pain, joint swelling, myalgias and neck pain.  Skin: Negative.   Allergic/Immunologic: Negative.   Neurological: Positive for weakness (RLE s/p CVA). Negative for dizziness and headaches.  Hematological: Negative.  Negative for adenopathy. Does not bruise/bleed easily.  Psychiatric/Behavioral: Negative.        Objective:   Physical Exam  Vitals reviewed. Constitutional: He appears well-developed and well-nourished. No distress.  HENT:  Head: Normocephalic and atraumatic.  Mouth/Throat: Oropharynx is clear and moist. No oropharyngeal exudate.  Eyes: Conjunctivae are normal. Right eye exhibits no discharge. Left eye exhibits no discharge. No scleral icterus.  Neck:  Normal range of motion. Neck supple. No JVD present. No tracheal deviation present. No thyromegaly present.  Cardiovascular: Normal rate, regular rhythm, normal heart sounds and intact distal pulses.  Exam reveals no gallop and no friction rub.   No murmur heard. Pulmonary/Chest: Effort normal and breath sounds normal. No stridor. No respiratory distress. He has no wheezes. He has no rales. He exhibits no tenderness.  Abdominal: Soft. Bowel sounds are normal. He exhibits no distension and no mass. There is no tenderness. There is no rebound and no guarding. Hernia confirmed negative in the right inguinal area and confirmed negative in the left inguinal area.  Genitourinary: Rectum normal, testes normal and penis normal. Rectal exam shows no external hemorrhoid, no internal hemorrhoid, no fissure, no mass, no tenderness and anal tone normal. Guaiac negative stool. Prostate is enlarged (1+ smooth symm BPH). Prostate is not tender. Right testis shows no mass, no swelling and no tenderness. Right testis is descended. Left testis shows no mass, no swelling and no tenderness. Left testis is descended. Circumcised. No penile erythema or penile tenderness. No discharge found.  Musculoskeletal: Normal range of motion. He exhibits no edema and no tenderness.  Lymphadenopathy:    He has no cervical adenopathy.       Right: No inguinal adenopathy present.       Left: No inguinal adenopathy present.  Neurological: He is alert. He has normal reflexes. He displays atrophy (RLE). He displays no tremor and normal reflexes. No cranial nerve deficit or sensory deficit. He exhibits abnormal muscle tone (RLE). He displays no seizure activity. Coordination and gait abnormal.  Skin: Skin is warm and dry. No rash noted. He is not diaphoretic. No erythema. No pallor.  Psychiatric: He has a normal mood and affect. His behavior is normal. Judgment and  thought content normal.   Lab Results  Component Value Date   WBC 7.6  06/06/2012   HGB 15.7 06/06/2012   HCT 44.6 06/06/2012   PLT 186 06/06/2012   GLUCOSE 99 02/20/2013   CHOL 142 02/20/2013   TRIG 59.0 02/20/2013   HDL 69.30 02/20/2013   LDLCALC 61 02/20/2013   ALT 41 02/20/2013   ALT 41 02/20/2013   AST 36 02/20/2013   AST 36 02/20/2013   NA 137 02/20/2013   K 3.9 02/20/2013   CL 99 02/20/2013   CREATININE 0.7 02/20/2013   BUN 15 02/20/2013   CO2 29 02/20/2013   PSA 1.46 11/15/2011   INR 1.10 06/02/2010   HGBA1C  Value: 5.1 (NOTE)                                                                       According to the ADA Clinical Practice Recommendations for 2011, when HbA1c is used as a screening test:   >=6.5%   Diagnostic of Diabetes Mellitus           (if abnormal result  is confirmed)  5.7-6.4%   Increased risk of developing Diabetes Mellitus  References:Diagnosis and Classification of Diabetes Mellitus,Diabetes CLEX,5170,01(VCBSW 1):S62-S69 and Standards of Medical Care in         Diabetes - 2011,Diabetes HQPR,9163,84  (Suppl 1):S11-S61. 05/26/2010         Assessment & Plan:

## 2014-02-19 NOTE — Patient Instructions (Signed)
Health Maintenance, Males A healthy lifestyle and preventative care can promote health and wellness.  Maintain regular health, dental, and eye exams.  Eat a healthy diet. Foods like vegetables, fruits, whole grains, low-fat dairy products, and lean protein foods contain the nutrients you need and are low in calories. Decrease your intake of foods high in solid fats, added sugars, and salt. Get information about a proper diet from your health care provider, if necessary.  Regular physical exercise is one of the most important things you can do for your health. Most adults should get at least 150 minutes of moderate-intensity exercise (any activity that increases your heart rate and causes you to sweat) each week. In addition, most adults need muscle-strengthening exercises on 2 or more days a week.   Maintain a healthy weight. The body mass index (BMI) is a screening tool to identify possible weight problems. It provides an estimate of body fat based on height and weight. Your health care provider can find your BMI and can help you achieve or maintain a healthy weight. For males 20 years and older:  A BMI below 18.5 is considered underweight.  A BMI of 18.5 to 24.9 is normal.  A BMI of 25 to 29.9 is considered overweight.  A BMI of 30 and above is considered obese.  Maintain normal blood lipids and cholesterol by exercising and minimizing your intake of saturated fat. Eat a balanced diet with plenty of fruits and vegetables. Blood tests for lipids and cholesterol should begin at age 20 and be repeated every 5 years. If your lipid or cholesterol levels are high, you are over 50, or you are at high risk for heart disease, you may need your cholesterol levels checked more frequently.Ongoing high lipid and cholesterol levels should be treated with medicines, if diet and exercise are not working.  If you smoke, find out from your health care provider how to quit. If you do not use tobacco, do not  start.  Lung cancer screening is recommended for adults aged 55 80 years who are at high risk for developing lung cancer because of a history of smoking. A yearly low-dose CT scan of the lungs is recommended for people who have at least a 30-pack-year history of smoking and are a current smoker or have quit within the past 15 years. A pack year of smoking is smoking an average of 1 pack of cigarettes a day for 1 year (for example, a 30-pack-year history of smoking could mean smoking 1 pack a day for 30 years or 2 packs a day for 15 years). Yearly screening should continue until the smoker has stopped smoking for at least 15 years. Yearly screening should be stopped for people who develop a health problem that would prevent them from having lung cancer treatment.  If you choose to drink alcohol, do not have more than 2 drinks per day. One drink is considered to be 12 oz (360 mL) of beer, 5 oz (150 mL) of wine, or 1.5 oz (45 mL) of liquor.  Avoid use of street drugs. Do not share needles with anyone. Ask for help if you need support or instructions about stopping the use of drugs.  High blood pressure causes heart disease and increases the risk of stroke. Blood pressure should be checked at least every 1 2 years. Ongoing high blood pressure should be treated with medicines if weight loss and exercise are not effective.  If you are 45 67 years old, ask your health   care provider if you should take aspirin to prevent heart disease.  Diabetes screening involves taking a blood sample to check your fasting blood sugar level. This should be done once every 3 years after age 45, if you are at a normal weight and without risk factors for diabetes. Testing should be considered at a younger age or be carried out more frequently if you are overweight and have at least 1 risk factor for diabetes.  Colorectal cancer can be detected and often prevented. Most routine colorectal cancer screening begins at the age of 50  and continues through age 75. However, your health care provider may recommend screening at an earlier age if you have risk factors for colon cancer. On a yearly basis, your health care provider may provide home test kits to check for hidden blood in the stool. A small camera at the end of a tube may be used to directly examine the colon (sigmoidoscopy or colonoscopy) to detect the earliest forms of colorectal cancer. Talk to your health care provider about this at age 50, when routine screening begins. A direct exam of the colon should be repeated every 5 10 years through age 75, unless early forms of pre-cancerous polyps or small growths are found.  People who are at an increased risk for hepatitis B should be screened for this virus. You are considered at high risk for hepatitis B if:  You were born in a country where hepatitis B occurs often. Talk with your health care provider about which countries are considered high-risk.  Your parents were born in a high-risk country and you have not received a shot to protect against hepatitis B (hepatitis B vaccine).  You have HIV or AIDS.  You use needles to inject street drugs.  You live with, or have sex with, someone who has hepatitis B.  You are a man who has sex with other men (MSM).  You get hemodialysis treatment.  You take certain medicines for conditions like cancer, organ transplantation, and autoimmune conditions.  Hepatitis C blood testing is recommended for all people born from 1945 through 1965 and any individual with known risk factors for hepatitis C.  Healthy men should no longer receive prostate-specific antigen (PSA) blood tests as part of routine cancer screening. Talk to your health care provider about prostate cancer screening.  Testicular cancer screening is not recommended for adolescents or adult males who have no symptoms. Screening includes self-exam, a health care provider exam, and other screening tests. Consult with  your health care provider about any symptoms you have or any concerns you have about testicular cancer.  Practice safe sex. Use condoms and avoid high-risk sexual practices to reduce the spread of sexually transmitted infections (STIs).  Use sunscreen. Apply sunscreen liberally and repeatedly throughout the day. You should seek shade when your shadow is shorter than you. Protect yourself by wearing long sleeves, pants, a wide-brimmed hat, and sunglasses year round, whenever you are outdoors.  Tell your health care provider of new moles or changes in moles, especially if there is a change in shape or color. Also tell your provider if a mole is larger than the size of a pencil eraser.  A one-time screening for abdominal aortic aneurysm (AAA) and surgical repair of large AAAs by ultrasound is recommended for men aged 65 75 years who are current or former smokers.  Stay current with your vaccines (immunizations). Document Released: 03/11/2008 Document Revised: 07/04/2013 Document Reviewed: 02/08/2011 ExitCare Patient Information 2014 [employer].   Hypertension As your heart beats, it forces blood through your arteries. This force is your blood pressure. If the pressure is too high, it is called hypertension (HTN) or high blood pressure. HTN is dangerous because you may have it and not know it. High blood pressure may mean that your heart has to work harder to pump blood. Your arteries may be narrow or stiff. The extra work puts you at risk for heart disease, stroke, and other problems.  Blood pressure consists of two numbers, a higher number over a lower, 110/72, for example. It is stated as "110 over 72." The ideal is below 120 for the top number (systolic) and under 80 for the bottom (diastolic). Write down your blood pressure today. You should pay close attention to your blood pressure if you have certain conditions such as:  Heart failure.  Prior heart attack.  Diabetes  Chronic kidney  disease.  Prior stroke.  Multiple risk factors for heart disease. To see if you have HTN, your blood pressure should be measured while you are seated with your arm held at the level of the heart. It should be measured at least twice. A one-time elevated blood pressure reading (especially in the Emergency Department) does not mean that you need treatment. There may be conditions in which the blood pressure is different between your right and left arms. It is important to see your caregiver soon for a recheck. Most people have essential hypertension which means that there is not a specific cause. This type of high blood pressure may be lowered by changing lifestyle factors such as:  Stress.  Smoking.  Lack of exercise.  Excessive weight.  Drug/tobacco/alcohol use.  Eating less salt. Most people do not have symptoms from high blood pressure until it has caused damage to the body. Effective treatment can often prevent, delay or reduce that damage. TREATMENT  When a cause has been identified, treatment for high blood pressure is directed at the cause. There are a large number of medications to treat HTN. These fall into several categories, and your caregiver will help you select the medicines that are best for you. Medications may have side effects. You should review side effects with your caregiver. If your blood pressure stays high after you have made lifestyle changes or started on medicines,   Your medication(s) may need to be changed.  Other problems may need to be addressed.  Be certain you understand your prescriptions, and know how and when to take your medicine.  Be sure to follow up with your caregiver within the time frame advised (usually within two weeks) to have your blood pressure rechecked and to review your medications.  If you are taking more than one medicine to lower your blood pressure, make sure you know how and at what times they should be taken. Taking two medicines  at the same time can result in blood pressure that is too low. SEEK IMMEDIATE MEDICAL CARE IF:  You develop a severe headache, blurred or changing vision, or confusion.  You have unusual weakness or numbness, or a faint feeling.  You have severe chest or abdominal pain, vomiting, or breathing problems. MAKE SURE YOU:   Understand these instructions.  Will watch your condition.  Will get help right away if you are not doing well or get worse. Document Released: 09/13/2005 Document Revised: 12/06/2011 Document Reviewed: 05/03/2008 Exit[employer] Patient Information 2014 [employer].  

## 2014-02-20 ENCOUNTER — Telehealth: Payer: Self-pay | Admitting: Internal Medicine

## 2014-02-20 NOTE — Telephone Encounter (Signed)
Relevant patient education assigned to patient using Emmi. ° °

## 2014-02-21 ENCOUNTER — Encounter: Payer: Self-pay | Admitting: Internal Medicine

## 2014-03-06 ENCOUNTER — Other Ambulatory Visit: Payer: Self-pay

## 2014-03-06 MED ORDER — CLOPIDOGREL BISULFATE 75 MG PO TABS: 75.0000 mg | ORAL_TABLET | Freq: Every day | ORAL | Status: DC

## 2014-04-09 ENCOUNTER — Other Ambulatory Visit: Payer: Self-pay

## 2014-04-09 MED ORDER — ATORVASTATIN CALCIUM 20 MG PO TABS: 20.0000 mg | ORAL_TABLET | Freq: Every day | ORAL | Status: DC

## 2014-04-25 ENCOUNTER — Other Ambulatory Visit: Payer: Self-pay

## 2014-04-25 MED ORDER — ATENOLOL-CHLORTHALIDONE 50-25 MG PO TABS: 1.0000 | ORAL_TABLET | Freq: Every day | ORAL | Status: DC

## 2014-05-27 ENCOUNTER — Other Ambulatory Visit: Payer: Self-pay

## 2014-05-27 MED ORDER — TADALAFIL 5 MG PO TABS: 5.0000 mg | ORAL_TABLET | Freq: Every day | ORAL | Status: DC | PRN

## 2014-07-01 ENCOUNTER — Ambulatory Visit (INDEPENDENT_AMBULATORY_CARE_PROVIDER_SITE_OTHER): Payer: Commercial Managed Care - HMO | Admitting: Internal Medicine

## 2014-07-01 ENCOUNTER — Encounter: Payer: Self-pay | Admitting: Internal Medicine

## 2014-07-01 VITALS — BP 138/88 | HR 60 | Temp 97.8°F | Resp 16 | Ht 73.0 in | Wt 191.0 lb

## 2014-07-01 DIAGNOSIS — R072 Precordial pain: Secondary | ICD-10-CM

## 2014-07-01 NOTE — Patient Instructions (Signed)

## 2014-07-01 NOTE — Progress Notes (Signed)
Pre visit review using our clinic review tool, if applicable. No additional management support is needed unless otherwise documented below in the visit note. 

## 2014-07-02 ENCOUNTER — Ambulatory Visit: Payer: Commercial Managed Care - HMO | Admitting: Internal Medicine

## 2014-07-03 NOTE — Assessment & Plan Note (Signed)
His EKG is normal This sounds like stable angina Will get an ETT done, if that is negative then I will evaluate further

## 2014-07-03 NOTE — Progress Notes (Signed)
   Subjective:    Patient ID: [name], male    DOB: 1946-07-21, 68 y.o.   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 The pain only occurs while he is pushing a Conservation officer, nature. The pain is described as a pressure. He has not cut the grass in 2 weeks and has not had any chest pain during that time. There is some DOE as well.     Review of Systems  Constitutional: Negative.  Negative for fever, chills, diaphoresis, appetite change and fatigue.  HENT: Negative.   Eyes: Negative.   Respiratory: Positive for shortness of breath. Negative for apnea, cough, choking, chest tightness, wheezing and stridor.   Cardiovascular: Positive for chest pain. Negative for palpitations and leg swelling.  Gastrointestinal: Negative.  Negative for nausea, vomiting, abdominal pain, diarrhea, constipation and blood in stool.  Endocrine: Negative.   Genitourinary: Negative.  Negative for difficulty urinating.  Musculoskeletal: Negative.  Negative for arthralgias, back pain, joint swelling, myalgias and neck pain.  Skin: Negative.  Negative for rash.  Allergic/Immunologic: Negative.   Neurological: Negative.  Negative for dizziness, tremors, syncope, weakness, light-headedness, numbness and headaches.  Hematological: Negative.  Negative for adenopathy. Does not bruise/bleed easily.  Psychiatric/Behavioral: Negative.        Objective:   Physical Exam  Vitals reviewed. Constitutional: He is oriented to person, place, and time. He appears well-developed and well-nourished. No distress.  HENT:  Head: Normocephalic and atraumatic.  Mouth/Throat: Oropharynx is clear and moist. No oropharyngeal exudate.  Eyes: Conjunctivae are normal. Right eye exhibits no discharge. Left eye exhibits no discharge. No scleral icterus.  Neck: Normal range of motion. Neck supple. No JVD  present. No tracheal deviation present. No thyromegaly present.  Cardiovascular: Normal rate, regular rhythm, normal heart sounds and intact distal pulses.  Exam reveals no gallop and no friction rub.   No murmur heard. Pulmonary/Chest: Effort normal and breath sounds normal. No stridor. No respiratory distress. He has no wheezes. He has no rales. He exhibits no tenderness.  Abdominal: Soft. Bowel sounds are normal. He exhibits no distension and no mass. There is no tenderness. There is no rebound and no guarding.  Musculoskeletal: Normal range of motion. He exhibits no edema and no tenderness.  Lymphadenopathy:    He has no cervical adenopathy.  Neurological: He is oriented to person, place, and time.  Skin: Skin is warm and dry. No rash noted. He is not diaphoretic. No erythema. No pallor.          Assessment & Plan:

## 2014-07-12 ENCOUNTER — Telehealth (HOSPITAL_COMMUNITY): Payer: Self-pay

## 2014-07-12 NOTE — Telephone Encounter (Signed)
Encounter complete. 

## 2014-07-16 ENCOUNTER — Telehealth (HOSPITAL_COMMUNITY): Payer: Self-pay

## 2014-07-16 NOTE — Telephone Encounter (Signed)
Encounter complete. 

## 2014-07-17 ENCOUNTER — Ambulatory Visit: Payer: Commercial Managed Care - HMO | Admitting: Cardiovascular Disease

## 2014-07-17 ENCOUNTER — Ambulatory Visit (HOSPITAL_COMMUNITY)
Admission: RE | Admit: 2014-07-17 | Discharge: 2014-07-17 | Disposition: A | Discharge status: Home / Self Care | Payer: Medicare HMO | Source: Ambulatory Visit | Attending: Cardiovascular Disease | Admitting: Cardiovascular Disease

## 2014-07-17 DIAGNOSIS — R079 Chest pain, unspecified: Secondary | ICD-10-CM | POA: Diagnosis present

## 2014-07-17 DIAGNOSIS — R072 Precordial pain: Secondary | ICD-10-CM

## 2014-07-17 NOTE — Procedures (Signed)
Exercise Treadmill Test  Pre-Exercise Testing Evaluation NSR, normal tracing  Test  Exercise Tolerance Test Ordering MD: Scarlette Calico, MD  Interpreting MD:   Unique Test No: 1  Treadmill:  1  Indication for ETT: chest pain - rule out ischemia  Contraindication to ETT: No   Stress Modality: exercise - treadmill  Cardiac Imaging Performed: non   Protocol: standard Bruce - maximal  Max BP:  191/113  Max MPHR (bpm):  153 85% MPR (bpm): 130  MPHR obtained (bpm): 123 % MPHR obtained: 80  Reached 85% MPHR (min:sec):  N/A Total Exercise Time (min-sec): 6:00  Workload in METS:  7.00 Borg Scale:   Reason ETT Terminated:  atypical chest pain, patient requested to stop treadmill    ST Segment Analysis At Rest: normal ST segments - no evidence of significant ST depression With Exercise: significant ischemic ST depression. 1-2 mm horizontal ST depression is seen in V4-V6 and the inferior leads  Other Information Arrhythmia:  frequent PVC during and following exercise, rare ventricular couplets Angina during ETT:  absent (0) Quality of ETT:  diagnostic  ETT Interpretation:  abnormal - evidence of ST depression consistent with ischemia  Comments: Fair exercise tolerance. Hypertensive response to exercise.  Recommendations: The patient was offered a same day Cardiology evaluation, but left before being seen by MD.

## 2014-07-18 ENCOUNTER — Other Ambulatory Visit: Payer: Self-pay | Admitting: Internal Medicine

## 2014-07-18 DIAGNOSIS — R9439 Abnormal result of other cardiovascular function study: Secondary | ICD-10-CM | POA: Insufficient documentation

## 2014-07-22 ENCOUNTER — Encounter: Payer: Self-pay | Admitting: Internal Medicine

## 2014-08-26 ENCOUNTER — Ambulatory Visit (INDEPENDENT_AMBULATORY_CARE_PROVIDER_SITE_OTHER): Payer: Commercial Managed Care - HMO | Admitting: Cardiology

## 2014-08-26 ENCOUNTER — Encounter: Payer: Self-pay | Admitting: Cardiology

## 2014-08-26 VITALS — BP 142/86 | HR 72 | Ht 73.0 in | Wt 191.0 lb

## 2014-08-26 DIAGNOSIS — I10 Essential (primary) hypertension: Secondary | ICD-10-CM

## 2014-08-26 DIAGNOSIS — I639 Cerebral infarction, unspecified: Secondary | ICD-10-CM

## 2014-08-26 DIAGNOSIS — E7800 Pure hypercholesterolemia, unspecified: Secondary | ICD-10-CM

## 2014-08-26 DIAGNOSIS — I209 Angina pectoris, unspecified: Secondary | ICD-10-CM

## 2014-08-26 NOTE — Progress Notes (Signed)
[name] Date of Birth:  [birth year] [name] [address] [place],   [address] [phone]        Fax   [phone]   History of Present Illness: This pleasant 68 year old gentleman is seen by me for the first time today.  He is a medical patient of Dr. Scarlette Calico.  The patient is seen because of concern over a new diagnosis of angina pectoris.  The patient gives a history that he has been in generally good health.  4 years ago at age 40 he had a series of 2 strokes within one week.  The strokes were felt to be secondary to poorly controlled blood pressure because he had been noncompliant with taking his blood pressure medicines.  The patient worked for 2 additional years following his strokes and then retired at age 66.  He has some residual problems with poor balance and weakness on his right side.  He did not have any symptoms to suggest heart problems until this summer when he began to notice exertional chest tightness with left arm radiation only when he would know his grass.  He would be relieved by rest.  He has not been experiencing any discomfort with any other activities and he has had no rest pain.  The patient had a Bruce protocol treadmill stress test at [facility] on 07/17/14.  This was an abnormal test with development of chest pressure associated with 1-2 mm ST segment horizontal depression in the inferolateral leads.  The patient left the building before his physician had a chance to talk with them about the stress test results.  The patient comes in today for further evaluation.  Since his treadmill he has had no further discomfort until this past weekend when he was mowing the grass and he began developed chest tightness with left arm radiation.  This time was a little worse because he also got clammy.  His wife noted his pallor and was concerned about him. The patient has risk factors for ischemic heart disease.  He has a history of  hypercholesterolemia and a history of high blood pressure.  He quit smoking about 40 years ago.  He is not diabetic.  His family history reveals that his father died of a heart attack at age 63.  His mother died of bowel problems at age 58. The patient does not smoke now.  He does drink about 2 beers per night.  Current Outpatient Prescriptions  Medication Sig Dispense Refill  . aspirin 81 MG tablet Take 81 mg by mouth daily.    Marland Kitchen atenolol-chlorthalidone (TENORETIC) 50-25 MG per tablet Take 1 tablet by mouth daily. 90 tablet 3  . atorvastatin (LIPITOR) 20 MG tablet Take 1 tablet (20 mg total) by mouth daily. 90 tablet 3  . clopidogrel (PLAVIX) 75 MG tablet Take 1 tablet (75 mg total) by mouth daily. 90 tablet 3  . FLUZONE HIGH-DOSE 0.5 ML SUSY   0   No current facility-administered medications for this visit.    No Known Allergies  Patient Active Problem List   Diagnosis Date Noted  . Angina pectoris 08/26/2014  . Abnormal stress ECG with treadmill 07/18/2014  . Precordial pain 07/01/2014  . Personal history of colonic polyps 02/19/2014  . BPH (benign prostatic hyperplasia) 02/19/2014  . Routine health maintenance 11/16/2011  . Hyperlipidemia LDL goal < 100   . Stroke   . Hypertension   . Alcohol abuse, unspecified  History  Smoking status  . Former Smoker -- 1.00 packs/day for 15 years  . Types: Cigarettes  . Quit date: 09/27/1978  Smokeless tobacco  . Never Used    History  Alcohol Use  . 1.8 oz/week  . 3 Cans of beer per week    Comment: stopped drinking after stroke. Had been drink 6 pk per day plus binge drinking    Family History  Problem Relation Age of Onset  . Heart disease Father   . Hypertension Father   . Cancer Neg Hx   . Diabetes Neg Hx   . Early death Neg Hx   . Hearing loss Neg Hx   . Hyperlipidemia Neg Hx   . Kidney disease Neg Hx   . Stroke Neg Hx   . Alcohol abuse Neg Hx     Review of Systems: Constitutional: no fever chills  diaphoresis or fatigue or change in weight.  Head and neck: no hearing loss, no epistaxis, no photophobia or visual disturbance. Respiratory: No cough, shortness of breath or wheezing. Cardiovascular: No  peripheral edema, palpitations.  Positive for exertional chest tightness Gastrointestinal: No abdominal distention, no abdominal pain, no change in bowel habits hematochezia or melena. Genitourinary: No dysuria, no frequency, no urgency, no nocturia. Musculoskeletal:No arthralgias, no back pain, no gait disturbance or myalgias. Neurological: No dizziness, no headaches, no numbness, no seizures, no syncope, no weakness, no tremors. Hematologic: No lymphadenopathy, no easy bruising. Psychiatric: No confusion, no hallucinations, no sleep disturbance.   Wt Readings from Last 3 Encounters:  08/26/14 191 lb (86.637 kg)  07/01/14 191 lb (86.637 kg)  02/19/14 193 lb (87.544 kg)    Physical Exam: Filed Vitals:   08/26/14 1047  BP: 142/86  Pulse: 72  The patient appears to be in no distress.  Head and neck exam reveals that the pupils are equal and reactive.  The extraocular movements are full.  There is no scleral icterus.  Mouth and pharynx are benign.  No lymphadenopathy.  No carotid bruits.  The jugular venous pressure is normal.  Thyroid is not enlarged or tender.  Chest is clear to percussion and auscultation.  No rales or rhonchi.  Expansion of the chest is symmetrical.  Heart reveals no abnormal lift or heave.  First and second heart sounds are normal.  There is no murmur gallop rub or click.  The abdomen is soft and nontender.  Bowel sounds are normoactive.  There is no hepatosplenomegaly or mass.  There are no abdominal bruits.  Extremities reveal no phlebitis or edema.  Pedal pulses are good.  There is no cyanosis or clubbing.  Neurologic exam is normal strength and no lateralizing weakness.  No sensory deficits.  Integument reveals no rash  EKG today shows normal sinus  rhythm and is within normal limits.  No ST-T wave changes at rest.  Assessment / Plan: 1.  Angina pectoris which is predictably brought on by physical exertion of mowing his grass.  Recent abnormal treadmill stress test consistent with ischemia. 2.  Essential hypertension without heart failure 3.  Hypercholesterolemia 4.  History of old stroke with residual mild right sided weakness. 5.  Family history of coronary disease  Disposition: The patient needs cardiac catheterization.  We spoke at length and were in the process of setting him up for cardiac catheterization/PCI for later this week.  However then the patient declined to fully commit himself, stating that he would have to discuss with his wife.  He indicated that he would  get back in touch with [place] quickly.  I told him that I did not want him exerting himself or cutting the grass until we were able to proceed with cardiac catheterization and possible PCI.  He is already on aspirin and Plavix which he will continue as well as continuing his Lipitor and his beta blocker.  Blood pressure today was borderline high.  Following his cardiac catheterization would anticipate that he might benefit from addition of an ACE inhibitor.  Once he calls back consenting to proceed with cardiac catheterization, we will need to update his preoperative labs and chest x-ray. Many thanks for the opportunity to see this pleasant gentleman with you.

## 2014-08-26 NOTE — Patient Instructions (Addendum)
Your physician has requested that you have a cardiac catheterization SOON Cardiac catheterization is used to diagnose and/or treat various heart conditions. Doctors may recommend this procedure for a number of different reasons. The most common reason is to evaluate chest pain. Chest pain can be a symptom of coronary artery disease (CAD), and cardiac catheterization can show whether plaque is narrowing or blocking your heart's arteries. This procedure is also used to evaluate the valves, as well as measure the blood flow and oxygen levels in different parts of your heart. For further information please visit [email or web address].   PLEASE CALL THE OFFICE AS SOON AS YOU DECIDE WHEN YOU WOULD LIKE TO PROCEED WITH THIS AT [phone]  Your physician recommends that you continue on your current medications as directed. Please refer to the Current Medication list given to you today.

## 2014-08-27 ENCOUNTER — Other Ambulatory Visit: Payer: Self-pay | Admitting: Cardiology

## 2014-08-27 ENCOUNTER — Encounter (HOSPITAL_COMMUNITY): Payer: Self-pay | Admitting: Pharmacy Technician

## 2014-08-27 ENCOUNTER — Encounter: Payer: Self-pay | Admitting: *Deleted

## 2014-08-27 ENCOUNTER — Telehealth: Payer: Self-pay | Admitting: Cardiology

## 2014-08-27 DIAGNOSIS — Q249 Congenital malformation of heart, unspecified: Secondary | ICD-10-CM

## 2014-08-27 DIAGNOSIS — I209 Angina pectoris, unspecified: Secondary | ICD-10-CM

## 2014-08-27 DIAGNOSIS — I10 Essential (primary) hypertension: Secondary | ICD-10-CM

## 2014-08-27 NOTE — Telephone Encounter (Signed)
Pt would like to be scheduled for a cardiac cath this week ASAP. Pt is scheduled for a cardiac cath "Left heart cath and coronary angiography' on 08/29/14 with Dr. Daneen Schick on . Pt to arrived at [facility] Stay of [facility] hospital at 7:00 AM Pt to come to the office for blood work on 08/28/14 for BMET CBC with diff PT/INR. Pre cath instructions placed at the front desk for pt to peak up tomorrow when he comes for labs. Pt is aware.

## 2014-08-27 NOTE — Telephone Encounter (Signed)
New msg   Patient states he was told to call if decided to have a stint placed. Patient states he wasn't certain yesterday at time of visit but after consideration he would like to discuss this more. Please contact [phone].

## 2014-08-27 NOTE — Telephone Encounter (Signed)
Thanks.  I have put in precath orders.

## 2014-08-28 ENCOUNTER — Encounter (HOSPITAL_COMMUNITY): Payer: Self-pay

## 2014-08-28 ENCOUNTER — Other Ambulatory Visit (INDEPENDENT_AMBULATORY_CARE_PROVIDER_SITE_OTHER): Payer: Medicare HMO | Admitting: *Deleted

## 2014-08-28 ENCOUNTER — Ambulatory Visit (HOSPITAL_COMMUNITY): Admit: 2014-08-28 | Payer: Medicare HMO | Admitting: Cardiovascular Disease

## 2014-08-28 DIAGNOSIS — Q249 Congenital malformation of heart, unspecified: Secondary | ICD-10-CM

## 2014-08-28 DIAGNOSIS — I209 Angina pectoris, unspecified: Secondary | ICD-10-CM

## 2014-08-28 DIAGNOSIS — I10 Essential (primary) hypertension: Secondary | ICD-10-CM

## 2014-08-28 LAB — CBC WITH DIFFERENTIAL/PLATELET
Basophils Absolute: 0 10*3/uL (ref 0.0–0.1)
Basophils Relative: 0.5 % (ref 0.0–3.0)
Eosinophils Absolute: 0.1 10*3/uL (ref 0.0–0.7)
Eosinophils Relative: 2 % (ref 0.0–5.0)
HCT: 44.9 % (ref 39.0–52.0)
HEMOGLOBIN: 15.1 g/dL (ref 13.0–17.0)
LYMPHS PCT: 37.7 % (ref 12.0–46.0)
Lymphs Abs: 2.2 10*3/uL (ref 0.7–4.0)
MCHC: 33.7 g/dL (ref 30.0–36.0)
MCV: 92.1 fl (ref 78.0–100.0)
MONOS PCT: 8.4 % (ref 3.0–12.0)
Monocytes Absolute: 0.5 10*3/uL (ref 0.1–1.0)
Neutro Abs: 3 10*3/uL (ref 1.4–7.7)
Neutrophils Relative %: 51.4 % (ref 43.0–77.0)
Platelets: 184 10*3/uL (ref 150.0–400.0)
RBC: 4.87 Mil/uL (ref 4.22–5.81)
RDW: 12.5 % (ref 11.5–15.5)
WBC: 5.9 10*3/uL (ref 4.0–10.5)

## 2014-08-28 LAB — PROTIME-INR
INR: 1.1 ratio — ABNORMAL HIGH (ref 0.8–1.0)
Prothrombin Time: 11.9 s (ref 9.6–13.1)

## 2014-08-28 SURGERY — LEFT HEART CATHETERIZATION WITH CORONARY ANGIOGRAM
Anesthesia: LOCAL

## 2014-08-29 ENCOUNTER — Ambulatory Visit (HOSPITAL_COMMUNITY)
Admission: RE | Admit: 2014-08-29 | Discharge: 2014-08-29 | Disposition: A | Discharge status: Home / Self Care | Payer: Medicare HMO | Source: Ambulatory Visit | Attending: Cardiovascular Disease | Admitting: Cardiovascular Disease

## 2014-08-29 ENCOUNTER — Encounter (HOSPITAL_COMMUNITY)
Admission: RE | Disposition: A | Discharge status: Still In Hospital | Payer: Self-pay | Source: Ambulatory Visit | Attending: Interventional Cardiology

## 2014-08-29 ENCOUNTER — Telehealth: Payer: Self-pay | Admitting: *Deleted

## 2014-08-29 ENCOUNTER — Other Ambulatory Visit: Payer: Self-pay | Admitting: *Deleted

## 2014-08-29 ENCOUNTER — Encounter (HOSPITAL_COMMUNITY)
Admission: RE | Disposition: A | Discharge status: Home / Self Care | Payer: Self-pay | Source: Ambulatory Visit | Attending: Cardiovascular Disease

## 2014-08-29 ENCOUNTER — Ambulatory Visit (HOSPITAL_COMMUNITY)
Admission: RE | Admit: 2014-08-29 | Discharge: 2014-08-29 | Disposition: A | Discharge status: Still In Hospital | Payer: Medicare HMO | Source: Ambulatory Visit | Attending: Interventional Cardiology | Admitting: Interventional Cardiology

## 2014-08-29 DIAGNOSIS — Z7982 Long term (current) use of aspirin: Secondary | ICD-10-CM | POA: Insufficient documentation

## 2014-08-29 DIAGNOSIS — I2510 Atherosclerotic heart disease of native coronary artery without angina pectoris: Secondary | ICD-10-CM

## 2014-08-29 DIAGNOSIS — I209 Angina pectoris, unspecified: Secondary | ICD-10-CM

## 2014-08-29 DIAGNOSIS — R0789 Other chest pain: Secondary | ICD-10-CM | POA: Insufficient documentation

## 2014-08-29 DIAGNOSIS — R9439 Abnormal result of other cardiovascular function study: Secondary | ICD-10-CM | POA: Diagnosis present

## 2014-08-29 DIAGNOSIS — I25110 Atherosclerotic heart disease of native coronary artery with unstable angina pectoris: Secondary | ICD-10-CM

## 2014-08-29 HISTORY — PX: LEFT HEART CATHETERIZATION WITH CORONARY ANGIOGRAM: SHX5451

## 2014-08-29 LAB — BASIC METABOLIC PANEL
BUN: 12 mg/dL (ref 6–23)
CALCIUM: 8.7 mg/dL (ref 8.4–10.5)
CO2: 27 meq/L (ref 19–32)
Chloride: 100 mEq/L (ref 96–112)
Creatinine, Ser: 0.8 mg/dL (ref 0.4–1.5)
GFR: 99.52 mL/min (ref >60.00)
Glucose, Bld: 106 mg/dL — ABNORMAL HIGH (ref 70–99)
POTASSIUM: 3.2 meq/L — AB (ref 3.5–5.1)
SODIUM: 132 meq/L — AB (ref 135–145)

## 2014-08-29 SURGERY — LEFT HEART CATHETERIZATION WITH CORONARY ANGIOGRAM
Anesthesia: LOCAL

## 2014-08-29 MED ORDER — MORPHINE SULFATE 2 MG/ML IJ SOLN: 2.0000 mg | INTRAMUSCULAR | Status: DC | PRN

## 2014-08-29 MED ORDER — POTASSIUM CHLORIDE CRYS ER 20 MEQ PO TBCR
20.0000 meq | EXTENDED_RELEASE_TABLET | Freq: Every day | ORAL | Status: DC
Start: 2014-08-29 — End: 2014-09-15

## 2014-08-29 MED ORDER — HEPARIN SODIUM (PORCINE) 1000 UNIT/ML IJ SOLN
INTRAMUSCULAR | Status: AC
  Filled 2014-08-29: qty 1

## 2014-08-29 MED ORDER — ASPIRIN 81 MG PO CHEW
CHEWABLE_TABLET | ORAL | Status: AC
  Filled 2014-08-29: qty 1

## 2014-08-29 MED ORDER — VERAPAMIL HCL 2.5 MG/ML IV SOLN
INTRAVENOUS | Status: AC
Start: 2014-08-29 — End: 2014-08-29
  Filled 2014-08-29: qty 2

## 2014-08-29 MED ORDER — HEPARIN (PORCINE) IN NACL 2-0.9 UNIT/ML-% IJ SOLN
INTRAMUSCULAR | Status: AC
  Filled 2014-08-29: qty 1000

## 2014-08-29 MED ORDER — FENTANYL CITRATE 0.05 MG/ML IJ SOLN
INTRAMUSCULAR | Status: AC
  Filled 2014-08-29: qty 2

## 2014-08-29 MED ORDER — NITROGLYCERIN 0.4 MG SL SUBL: 0.4000 mg | SUBLINGUAL_TABLET | SUBLINGUAL | Status: DC | PRN

## 2014-08-29 MED ORDER — NITROGLYCERIN 1 MG/10 ML FOR IR/CATH LAB
INTRA_ARTERIAL | Status: AC
  Filled 2014-08-29: qty 10

## 2014-08-29 MED ORDER — MIDAZOLAM HCL 2 MG/2ML IJ SOLN
INTRAMUSCULAR | Status: AC
  Filled 2014-08-29: qty 2

## 2014-08-29 MED ORDER — ISOSORBIDE MONONITRATE 15 MG HALF TABLET: 15.0000 mg | ORAL_TABLET | Freq: Every day | ORAL | Status: DC

## 2014-08-29 MED ORDER — ASPIRIN 81 MG PO CHEW
81.0000 mg | CHEWABLE_TABLET | ORAL | Status: AC
  Administered 2014-08-29: 81 mg via ORAL

## 2014-08-29 MED ORDER — LIDOCAINE HCL (PF) 1 % IJ SOLN
INTRAMUSCULAR | Status: AC
  Filled 2014-08-29: qty 30

## 2014-08-29 MED ORDER — SODIUM CHLORIDE 0.9 % IV SOLN: 250.0000 mL | INTRAVENOUS | Status: DC | PRN

## 2014-08-29 MED ORDER — ONDANSETRON HCL 4 MG/2ML IJ SOLN: 4.0000 mg | Freq: Four times a day (QID) | INTRAMUSCULAR | Status: DC | PRN

## 2014-08-29 MED ORDER — ACETAMINOPHEN 325 MG PO TABS: 650.0000 mg | ORAL_TABLET | ORAL | Status: DC | PRN

## 2014-08-29 MED ORDER — SODIUM CHLORIDE 0.9 % IV SOLN
INTRAVENOUS | Status: DC
  Administered 2014-08-29: 08:00:00 via INTRAVENOUS

## 2014-08-29 MED ORDER — ASPIRIN 81 MG PO CHEW: 81.0000 mg | CHEWABLE_TABLET | Freq: Every day | ORAL | Status: DC

## 2014-08-29 MED ORDER — SODIUM CHLORIDE 0.9 % IJ SOLN: 3.0000 mL | INTRAMUSCULAR | Status: DC | PRN

## 2014-08-29 MED ORDER — SODIUM CHLORIDE 0.9 % IJ SOLN: 3.0000 mL | Freq: Two times a day (BID) | INTRAMUSCULAR | Status: DC

## 2014-08-29 MED ORDER — SODIUM CHLORIDE 0.9 % IV SOLN: INTRAVENOUS | Status: DC

## 2014-08-29 NOTE — Telephone Encounter (Signed)
Advised patient of lab results, rx sent to [facility]

## 2014-08-29 NOTE — Consult Note (Signed)
[address]       [place] [address]             [phone]        Korbin C Leise [place] Medical Record #[id number] Date of Birth: Mar 13, 1946  Referring: No ref. provider found Primary Care: Scarlette Calico, MD  Chief Complaint: exertional chest pain   History of Present Illness:      Patient examined, coronary arteriograms reviewed. 68 yo reformed smoker with HTN, dyslipidemia and prior hx of posterior circulation CVA 4 yrs ago was found to have severe 3 vessel CAD today at outpt cath today.  Surgical coronary revascularization has been recommended by his cardiologist.  The patient has had class III angina the past few months which has increased in severity.A stress test was positive so he underwent catheterization today. LV function is normal. He has high grade stenosis of the LAD, RCA, Ramus and total circumflex occlusion. Echocardiogram has not yet been performed.   Current Activity/ Functional Status: Patient retired, lives with wife, enjoys doing yardwork   Zubrod Score: At the time of surgery this patient's most appropriate activity status/level should be described as: []     0    Normal activity, no symptoms [x]     1    Restricted in physical strenuous activity but ambulatory, able to do out light work []     2    Ambulatory and capable of self care, unable to do work activities, up and about                 more than 50%  Of the time                            []     3    Only limited self care, in bed greater than 50% of waking hours []     4    Completely disabled, no self care, confined to bed or chair []     5    Moribund  Past Medical History  Diagnosis Date  . Alcohol abuse, unspecified   . Hypertension   . Kidney stone on right side     spontaneously passed  . Stroke     August '11 x 2: L. cerebellar, lateral medulla, ol rd lacunar infarcts;  2nd left pontine infarct.  . Hyperlipidemia     preTx 114, postTx 47 LDL  . H/O renal  calculi   . Arthritis     in back and shoulder    Past Surgical History  Procedure Laterality Date  . Tonsillectomy  1954  . Lumbar disc surgery  1986    Dr. Juanetta Snow  . Tonsillectomy    . Back surgery  1986  . Rotator cuff repair  06/10/12    at [facility]...left shoulder    History  Smoking status  . Former Smoker -- 1.00 packs/day for 15 years  . Types: Cigarettes  . Quit date: 09/27/1978  Smokeless tobacco  . Never Used    History  Alcohol Use  . 1.8 oz/week  . 3 Cans of beer per week    Comment: stopped drinking after stroke. Had been drink 6 pk per day plus binge drinking    History   Social History  . Marital Status: Married    Spouse Name: N/A    Number of Children: 2  . Years of Education: 12   Occupational History  .  retail sales - paint    Social History Main Topics  . Smoking status: Former Smoker -- 1.00 packs/day for 15 years    Types: Cigarettes    Quit date: 09/27/1978  . Smokeless tobacco: Never Used  . Alcohol Use: 1.8 oz/week    3 Cans of beer per week     Comment: stopped drinking after stroke. Had been drink 6 pk per day plus binge drinking  . Drug Use: No  . Sexual Activity:    Partners: Female   Other Topics Concern  . Not on file   Social History Narrative   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 Married '79. 1 dtr- '68, 1 step son. 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 Marriage in good health. He reports he is very strong in his faith.     No Known Allergies  Current Facility-Administered Medications  Medication Dose Route Frequency Provider Last Rate Last Dose  . 0.9 %  sodium chloride infusion   Intravenous Continuous Lorretta Harp, MD      . acetaminophen (TYLENOL) tablet 650 mg  650 mg Oral Q4H PRN Lorretta Harp, MD      . aspirin chewable tablet 81 mg  81 mg Oral Daily Lorretta Harp, MD      . isosorbide mononitrate (IMDUR) 24 hr  tablet 15 mg  15 mg Oral Daily Lorretta Harp, MD      . morphine 2 MG/ML injection 2 mg  2 mg Intravenous Q1H PRN Lorretta Harp, MD      . ondansetron [facility]) injection 4 mg  4 mg Intravenous Q6H PRN Lorretta Harp, MD        Prescriptions prior to admission  Medication Sig Dispense Refill Last Dose  . aspirin 81 MG tablet Take 81 mg by mouth daily.   08/28/2014 at Unknown time  . atenolol-chlorthalidone (TENORETIC) 50-25 MG per tablet Take 1 tablet by mouth daily. 90 tablet 3 08/28/2014 at Unknown time  . atorvastatin (LIPITOR) 20 MG tablet Take 1 tablet (20 mg total) by mouth daily. 90 tablet 3 08/28/2014 at Unknown time  . clopidogrel (PLAVIX) 75 MG tablet Take 1 tablet (75 mg total) by mouth daily. 90 tablet 3 08/28/2014 at Unknown time  . FLUZONE HIGH-DOSE 0.5 ML SUSY   0 Taking    Family History  Problem Relation Age of Onset  . Heart disease Father   . Hypertension Father   . Cancer Neg Hx   . Diabetes Neg Hx   . Early death Neg Hx   . Hearing loss Neg Hx   . Hyperlipidemia Neg Hx   . Kidney disease Neg Hx   . Stroke Neg Hx   . Alcohol abuse Neg Hx      Review of Systems:  Patient has been on Plavix 4 yrs since his CVA Slight R sided weakness from lacunar CVA 2011 No hx of thoracic trauma No problems with general anesthesia with his back, shoulder surgery. No hx of DVT, varicose veins CXR shows emphysema- PFT's are pending Farther died of MI age 81     Cardiac Review of Systems: Y or N  Chest Pain [   x ]  Resting SOB [   ] Exertional SOB  [x  ]  Orthopnea [  ]   Pedal Edema [   ]    Palpitations [  ] Syncope  [  ]  Presyncope [   ]  General Review of Systems: [Y] = yes [  ]=no Constitional: recent weight change [  ]; anorexia [  ]; fatigue [  ]; nausea [  ]; night sweats [  ]; fever [  ]; or chills [  ]                                                               Dental: poor dentition[  ]; Last Dentist visit:every 6 months   Eye : blurred vision [  ];  diplopia [   ]; vision changes [  ];  Amaurosis fugax[  ]; Resp: cough [  ];  wheezing[  ];  hemoptysis[  ]; shortness of breath[  ]; paroxysmal nocturnal dyspnea[  ]; dyspnea on exertion[  ]; or orthopnea[  ];  GI:  gallstones[  ], vomiting[  ];  dysphagia[  ]; melena[  ];  hematochezia [  ]; heartburn[  ];   Hx of  Colonoscopy[  ]; GU: kidney stones [  ]; hematuria[  ];   dysuria [  ];  nocturia[  ];  history of     obstruction [  ]; urinary frequency [  ]             Skin: rash, swelling[  ];, hair loss[  ];  peripheral edema[  ];  or itching[  ]; Musculosketetal: myalgias[  ];  joint swelling[  ];  joint erythema[  ];  joint pain[  ];  back pain[  ];  Heme/Lymph: bruising[  ];  bleeding[  ];  anemia[  ];  Neuro: TIA[  ];  headaches[  ];  stroke[  ];  vertigo[  ];  seizures[  ];   paresthesias[  ];  difficulty walking[  ];  Psych:depression[  ]; anxiety[  ];  Endocrine: diabetes[  ];  thyroid dysfunction[  ];  Immunizations: Flu [  ]; Pneumococcal[  ];  Other:  Physical Exam: BP 154/93 mmHg  Pulse 73  Temp(Src) 97.7 F (36.5 C) (Oral)  Resp 18  SpO2 97%  Gen- well appearing, NAD HEENT- PERL, neck w/o JVD or adenopathy, no  Bruits Chest- no deformity, breath sounds clear COR- RRR, no murmur Abd- soft, nontender, no pulsatile mass Extrem- no clubbing, cyanosis or tenderness-compression dressing over R radial artery Neuro- mild R side wealness   Diagnostic Studies & Laboratory data:   COR angiograms inspected- 3 v CAD   Recent Radiology Findings:   No results found.    Recent Lab Findings: Lab Results  Component Value Date   WBC 5.9 08/28/2014   HGB 15.1 08/28/2014   HCT 44.9 08/28/2014   PLT 184.0 08/28/2014   GLUCOSE 106* 08/28/2014   CHOL 154 02/19/2014   TRIG 45.0 02/19/2014   HDL 68.50 02/19/2014   LDLCALC 77 02/19/2014   ALT 36 02/19/2014   AST 34 02/19/2014   NA 132* 08/28/2014   K 3.2* 08/28/2014   CL 100 08/28/2014   CREATININE 0.8 08/28/2014   BUN 12  08/28/2014   CO2 27 08/28/2014   TSH 1.48 02/19/2014   INR 1.1* 08/28/2014   HGBA1C  05/26/2010    5.1 (NOTE)  According to the ADA Clinical Practice Recommendations for 2011, when HbA1c is used as a screening test:   >=6.5%   Diagnostic of Diabetes Mellitus           (if abnormal result  is confirmed)  5.7-6.4%   Increased risk of developing Diabetes Mellitus  References:Diagnosis and Classification of Diabetes Mellitus,Diabetes CXKG,8185,63(JSHFW 1):S62-S69 and Standards of Medical Care in         Diabetes - 2011,Diabetes YOVZ,8588,50  (Suppl 1):S11-S61.      Assessment / Plan:     The patient would benefit from multivessel CABG    Surgery will be scheduled Dec 11 to allow Plavix washout    Procedure, benefits and risks have been discussed in detail with patient and wife- the agree to proceed with CABG next week       @ME1 @ 08/29/2014 1:19 PM

## 2014-08-29 NOTE — CV Procedure (Signed)
[name] is a 68 y.o. male    [id number] LOCATION:  FACILITY: [facility]  PHYSICIAN: Quay Burow, M.D. 01/05/1946   DATE OF PROCEDURE:  08/29/2014  DATE OF DISCHARGE:     CARDIAC CATHETERIZATION     History obtained from chart review. [name] is a 68 year old married Caucasian male patient of Dr. Eilleen Kempf and Dr. Warren Danes. He was referred for outpatient cardiac catheterization because of exertional chest pressure and positive Bruce Pirkle exercise stress test in the setting of multiple cardiac risk factors including family history, hypertension, hypercholesterolemia and remote tobacco abuse.   PROCEDURE DESCRIPTION:   The patient was brought to the second floor [facility] cath lab in the postabsorptive state. He was premedicated with Valium 5 mg by mouth, IV Versed and fentanyl. His right wrist was prepped and shaved in usual sterile fashion. Xylocaine 1% was used for local anesthesia. A 6 French sheath was inserted into the right radial artery using standard Seldinger technique. The patient received 4000 units  of heparin  intravenously.  A 5 [place] TIG catheter and pigtail catheters were used for selective coronary angiography and left ventriculography respectively. Visipaque dye was used for the entirety of the case (60 mL administered patient). Retrograde aortic, left ventricular and pullback pressures were recorded.    HEMODYNAMICS:    AO SYSTOLIC/AO DIASTOLIC: 564/33   LV SYSTOLIC/LV DIASTOLIC: 295/1  ANGIOGRAPHIC RESULTS:   1. Left main; 20-30% tapering distal 2. LAD; fluoroscopically calcified with a 75-80% fairly focal stenosis in the proximal portion 3. Left circumflex; nondominant and occluded at its origin with faint left to left collaterals. There was a moderate to large ramus branch that had a 75-80% hypodense calcified proximal stenosis.  4. Right coronary artery; dominant with a 95% calcified mid stenosis. There were right to left collaterals  to the occluded circumflex coronary artery 5. Left ventriculography; RAO left ventriculogram was performed using  25 mL of Visipaque dye at 12 mL/second. The overall LVEF estimated  60 %  Without wall motion abnormalities  IMPRESSION:[name] has calcified vessels fluoroscopically with four-vessel disease including high-grade disease in his proximal LAD, ramus branch and mid dominant RCA with an occluded nondominant circumflex and right to left circumflex collaterals. He has normal LV function. He has stable exertional angina on minimal antianginal medications including beta blocker. I believe the best therapy would be complete revascularization using coronary artery bypass grafting. The patient was in a denial regarding this. He is on aspirin and Plavix and will need a "Plavix washout for 5-7 days. I have contacted [facility] who will arrange for the patient to be seen as an outpatient and schedule his coronary artery bypass graft operation. I have also discussed the findings with his cardiologist, Dr. Warren Danes.  Lorretta Harp MD, [facility] 08/29/2014 10:46 AM

## 2014-08-29 NOTE — Interval H&P Note (Signed)
Cath Lab Visit (complete for each Cath Lab visit)  Clinical Evaluation Leading to the Procedure:   ACS: No.  Non-ACS:    Anginal Classification: CCS II  Anti-ischemic medical therapy: Minimal Therapy (1 class of medications)  Non-Invasive Test Results: Intermediate-risk stress test findings: cardiac mortality 1-3%/year  Prior CABG: No previous CABG      History and Physical Interval Note:  08/29/2014 9:49 AM  [name]  has presented today for surgery, with the diagnosis of cp  The various methods of treatment have been discussed with the patient and family. After consideration of risks, benefits and other options for treatment, the patient has consented to  Procedure(s): LEFT HEART CATHETERIZATION WITH CORONARY ANGIOGRAM (N/A) as a surgical intervention .  The patient's history has been reviewed, patient examined, no change in status, stable for surgery.  I have reviewed the patient's chart and labs.  Questions were answered to the patient's satisfaction.     Lorretta Harp

## 2014-08-29 NOTE — Discharge Instructions (Signed)
Radial Site Care °Refer to this sheet in the next few weeks. These instructions provide you with information on caring for yourself after your procedure. Your caregiver may also give you more specific instructions. Your treatment has been planned according to current medical practices, but problems sometimes occur. Call your caregiver if you have any problems or questions after your procedure. °HOME CARE INSTRUCTIONS °· You may shower the day after the procedure. Remove the bandage (dressing) and gently wash the site with plain soap and water. Gently pat the site dry. °· Do not apply powder or lotion to the site. °· Do not submerge the affected site in water for 3 to 5 days. °· Inspect the site at least twice daily. °· Do not flex or bend the affected arm for 24 hours. °· No lifting over 5 pounds (2.3 kg) for 5 days after your procedure. °· Do not drive home if you are discharged the same day of the procedure. Have someone else drive you. °· You may drive 24 hours after the procedure unless otherwise instructed by your caregiver. °· Do not operate machinery or power tools for 24 hours. °· A responsible adult should be with you for the first 24 hours after you arrive home. °What to expect: °· Any bruising will usually fade within 1 to 2 weeks. °· Blood that collects in the tissue (hematoma) may be painful to the touch. It should usually decrease in size and tenderness within 1 to 2 weeks. °SEEK IMMEDIATE MEDICAL CARE IF: °· You have unusual pain at the radial site. °· You have redness, warmth, swelling, or pain at the radial site. °· You have drainage (other than a small amount of blood on the dressing). °· You have chills. °· You have a fever or persistent symptoms for more than 72 hours. °· You have a fever and your symptoms suddenly get worse. °· Your arm becomes pale, cool, tingly, or numb. °· You have heavy bleeding from the site. Hold pressure on the site. °Document Released: 10/16/2010 Document Revised:  12/06/2011 Document Reviewed: 10/16/2010 °ExitCare® Patient Information ©2015 [employer]. This information is not intended to replace advice given to you by your health care provider. Make sure you discuss any questions you have with your health care provider. ° °

## 2014-08-29 NOTE — H&P (View-Only) (Signed)
Georgette Dover Date of Birth:  [birth year] [facility] [address] [place], [place]  [address] [phone]        Fax   [phone]   History of Present Illness: This pleasant 68 year old gentleman is seen by me for the first time today.  He is a medical patient of Dr. Scarlette Calico.  The patient is seen because of concern over a new diagnosis of angina pectoris.  The patient gives a history that he has been in generally good health.  4 years ago at age 30 he had a series of 2 strokes within one week.  The strokes were felt to be secondary to poorly controlled blood pressure because he had been noncompliant with taking his blood pressure medicines.  The patient worked for 2 additional years following his strokes and then retired at age 59.  He has some residual problems with poor balance and weakness on his right side.  He did not have any symptoms to suggest heart problems until this summer when he began to notice exertional chest tightness with left arm radiation only when he would know his grass.  He would be relieved by rest.  He has not been experiencing any discomfort with any other activities and he has had no rest pain.  The patient had a Bruce protocol treadmill stress test at [facility] on 07/17/14.  This was an abnormal test with development of chest pressure associated with 1-2 mm ST segment horizontal depression in the inferolateral leads.  The patient left the building before his physician had a chance to talk with them about the stress test results.  The patient comes in today for further evaluation.  Since his treadmill he has had no further discomfort until this past weekend when he was mowing the grass and he began developed chest tightness with left arm radiation.  This time was a little worse because he also got clammy.  His wife noted his pallor and was concerned about him. The patient has risk factors for ischemic heart disease.  He has a history of  hypercholesterolemia and a history of high blood pressure.  He quit smoking about 40 years ago.  He is not diabetic.  His family history reveals that his father died of a heart attack at age 74.  His mother died of bowel problems at age 73. The patient does not smoke now.  He does drink about 2 beers per night.  Current Outpatient Prescriptions  Medication Sig Dispense Refill  . aspirin 81 MG tablet Take 81 mg by mouth daily.    Marland Kitchen atenolol-chlorthalidone (TENORETIC) 50-25 MG per tablet Take 1 tablet by mouth daily. 90 tablet 3  . atorvastatin (LIPITOR) 20 MG tablet Take 1 tablet (20 mg total) by mouth daily. 90 tablet 3  . clopidogrel (PLAVIX) 75 MG tablet Take 1 tablet (75 mg total) by mouth daily. 90 tablet 3  . FLUZONE HIGH-DOSE 0.5 ML SUSY   0   No current facility-administered medications for this visit.    No Known Allergies  Patient Active Problem List   Diagnosis Date Noted  . Angina pectoris 08/26/2014  . Abnormal stress ECG with treadmill 07/18/2014  . Precordial pain 07/01/2014  . Personal history of colonic polyps 02/19/2014  . BPH (benign prostatic hyperplasia) 02/19/2014  . Routine health maintenance 11/16/2011  . Hyperlipidemia LDL goal < 100   . Stroke   . Hypertension   . Alcohol abuse, unspecified  History  Smoking status  . Former Smoker -- 1.00 packs/day for 15 years  . Types: Cigarettes  . Quit date: 09/27/1978  Smokeless tobacco  . Never Used    History  Alcohol Use  . 1.8 oz/week  . 3 Cans of beer per week    Comment: stopped drinking after stroke. Had been drink 6 pk per day plus binge drinking    Family History  Problem Relation Age of Onset  . Heart disease Father   . Hypertension Father   . Cancer Neg Hx   . Diabetes Neg Hx   . Early death Neg Hx   . Hearing loss Neg Hx   . Hyperlipidemia Neg Hx   . Kidney disease Neg Hx   . Stroke Neg Hx   . Alcohol abuse Neg Hx     Review of Systems: Constitutional: no fever chills  diaphoresis or fatigue or change in weight.  Head and neck: no hearing loss, no epistaxis, no photophobia or visual disturbance. Respiratory: No cough, shortness of breath or wheezing. Cardiovascular: No  peripheral edema, palpitations.  Positive for exertional chest tightness Gastrointestinal: No abdominal distention, no abdominal pain, no change in bowel habits hematochezia or melena. Genitourinary: No dysuria, no frequency, no urgency, no nocturia. Musculoskeletal:No arthralgias, no back pain, no gait disturbance or myalgias. Neurological: No dizziness, no headaches, no numbness, no seizures, no syncope, no weakness, no tremors. Hematologic: No lymphadenopathy, no easy bruising. Psychiatric: No confusion, no hallucinations, no sleep disturbance.   Wt Readings from Last 3 Encounters:  08/26/14 191 lb (86.637 kg)  07/01/14 191 lb (86.637 kg)  02/19/14 193 lb (87.544 kg)    Physical Exam: Filed Vitals:   08/26/14 1047  BP: 142/86  Pulse: 72  The patient appears to be in no distress.  Head and neck exam reveals that the pupils are equal and reactive.  The extraocular movements are full.  There is no scleral icterus.  Mouth and pharynx are benign.  No lymphadenopathy.  No carotid bruits.  The jugular venous pressure is normal.  Thyroid is not enlarged or tender.  Chest is clear to percussion and auscultation.  No rales or rhonchi.  Expansion of the chest is symmetrical.  Heart reveals no abnormal lift or heave.  First and second heart sounds are normal.  There is no murmur gallop rub or click.  The abdomen is soft and nontender.  Bowel sounds are normoactive.  There is no hepatosplenomegaly or mass.  There are no abdominal bruits.  Extremities reveal no phlebitis or edema.  Pedal pulses are good.  There is no cyanosis or clubbing.  Neurologic exam is normal strength and no lateralizing weakness.  No sensory deficits.  Integument reveals no rash  EKG today shows normal sinus  rhythm and is within normal limits.  No ST-T wave changes at rest.  Assessment / Plan: 1.  Angina pectoris which is predictably brought on by physical exertion of mowing his grass.  Recent abnormal treadmill stress test consistent with ischemia. 2.  Essential hypertension without heart failure 3.  Hypercholesterolemia 4.  History of old stroke with residual mild right sided weakness. 5.  Family history of coronary disease  Disposition: The patient needs cardiac catheterization.  We spoke at length and were in the process of setting him up for cardiac catheterization/PCI for later this week.  However then the patient declined to fully commit himself, stating that he would have to discuss with his wife.  He indicated that he would  get back in touch with [place] quickly.  I told him that I did not want him exerting himself or cutting the grass until we were able to proceed with cardiac catheterization and possible PCI.  He is already on aspirin and Plavix which he will continue as well as continuing his Lipitor and his beta blocker.  Blood pressure today was borderline high.  Following his cardiac catheterization would anticipate that he might benefit from addition of an ACE inhibitor.  Once he calls back consenting to proceed with cardiac catheterization, we will need to update his preoperative labs and chest x-ray. Many thanks for the opportunity to see this pleasant gentleman with you.

## 2014-08-29 NOTE — Telephone Encounter (Signed)
-----   Message from Darlin Coco, MD sent at 08/29/2014  1:01 PM EST ----- Please report.  The potassium is low.  Start K Dur 20 mEq one daily.  He had cardiac catheterization today.  He may be going home today or tomorrow to await CABG.

## 2014-09-04 ENCOUNTER — Ambulatory Visit (HOSPITAL_COMMUNITY)
Admission: RE | Admit: 2014-09-04 | Discharge: 2014-09-04 | Disposition: A | Discharge status: Home / Self Care | Payer: Commercial Managed Care - HMO | Source: Ambulatory Visit | Attending: Internal Medicine | Admitting: Internal Medicine

## 2014-09-04 DIAGNOSIS — I2510 Atherosclerotic heart disease of native coronary artery without angina pectoris: Secondary | ICD-10-CM | POA: Diagnosis present

## 2014-09-04 DIAGNOSIS — I519 Heart disease, unspecified: Secondary | ICD-10-CM

## 2014-09-04 DIAGNOSIS — Z01810 Encounter for preprocedural cardiovascular examination: Secondary | ICD-10-CM

## 2014-09-04 NOTE — Progress Notes (Signed)
VASCULAR LAB PRELIMINARY  PRELIMINARY  PRELIMINARY  PRELIMINARY  Pre-op Cardiac Surgery  Carotid Findings:  Bilateral:  1-39% ICA stenosis.  Vertebral artery flow is antegrade.     Upper Extremity Right Left  Brachial Pressures 132 Triphasic 132 Triphasic  Radial Waveforms Triphasic Triphasic  Ulnar Waveforms Triphasic Triphasic  Palmar Arch (Allen's Test) Normal Abnormal   Findings:  Doppler waveforms remained normal bilaterally with both radial and ulnar compressions on the right. Left Doppler waveforms remained normal with radial compression and obliterated with ulnar compression.    Lower  Extremity Right Left  Dorsalis Pedis    Anterior Tibial    Posterior Tibial    Ankle/Brachial Indices      Findings:  Pedal pulses were palpable bilaterally.   Faviola Klare, RVS 09/04/2014, 2:24 PM

## 2014-09-04 NOTE — Progress Notes (Signed)
Echo Lab  2D Echocardiogram completed.  [name] L Jesper Stirewalt, RDCS 09/04/2014 1:29 PM

## 2014-09-05 ENCOUNTER — Encounter (HOSPITAL_COMMUNITY): Payer: Self-pay | Admitting: Cardiovascular Disease

## 2014-09-05 ENCOUNTER — Encounter (HOSPITAL_COMMUNITY)
Admit: 2014-09-05 | Discharge: 2014-09-05 | Disposition: A | Discharge status: Home / Self Care | Payer: Medicare HMO | Source: Ambulatory Visit | Attending: Cardiothoracic Surgery | Admitting: Cardiothoracic Surgery

## 2014-09-05 ENCOUNTER — Ambulatory Visit (HOSPITAL_COMMUNITY)
Admission: RE | Admit: 2014-09-05 | Discharge: 2014-09-05 | Disposition: A | Discharge status: Home / Self Care | Payer: Medicare HMO | Source: Ambulatory Visit | Attending: Cardiothoracic Surgery | Admitting: Cardiothoracic Surgery

## 2014-09-05 ENCOUNTER — Telehealth: Payer: Self-pay | Admitting: *Deleted

## 2014-09-05 ENCOUNTER — Ambulatory Visit (HOSPITAL_COMMUNITY)
Admit: 2014-09-05 | Discharge: 2014-09-05 | Disposition: A | Discharge status: Home / Self Care | Payer: Medicare HMO | Attending: Cardiothoracic Surgery | Admitting: Cardiothoracic Surgery

## 2014-09-05 VITALS — BP 141/84 | HR 67 | Temp 98.4°F | Resp 18 | Ht 74.0 in | Wt 190.2 lb

## 2014-09-05 DIAGNOSIS — Z01818 Encounter for other preprocedural examination: Secondary | ICD-10-CM | POA: Insufficient documentation

## 2014-09-05 DIAGNOSIS — Z01811 Encounter for preprocedural respiratory examination: Secondary | ICD-10-CM

## 2014-09-05 DIAGNOSIS — I2510 Atherosclerotic heart disease of native coronary artery without angina pectoris: Secondary | ICD-10-CM

## 2014-09-05 HISTORY — DX: Reserved for inherently not codable concepts without codable children: IMO0001

## 2014-09-05 HISTORY — DX: Personal history of urinary calculi: Z87.442

## 2014-09-05 HISTORY — DX: Angina pectoris, unspecified: I20.9

## 2014-09-05 LAB — PULMONARY FUNCTION TEST
DL/VA % pred: 83 %
DL/VA: 3.99 ml/min/mmHg/L
DLCO cor % pred: 67 %
DLCO cor: 24.7 ml/min/mmHg
DLCO unc % pred: 67 %
DLCO unc: 24.7 ml/min/mmHg
FEF 25-75 Post: 3.03 L/sec
FEF 25-75 Pre: 2.86 L/sec
FEF2575-%Change-Post: 5 %
FEF2575-%Pred-Post: 104 %
FEF2575-%Pred-Pre: 98 %
FEV1-%Change-Post: 1 %
FEV1-%Pred-Post: 104 %
FEV1-%Pred-Pre: 103 %
FEV1-Post: 3.91 L
FEV1-Pre: 3.86 L
FEV1FVC-%Change-Post: 4 %
FEV1FVC-%Pred-Pre: 100 %
FEV6-%Change-Post: -2 %
FEV6-%Pred-Post: 103 %
FEV6-%Pred-Pre: 106 %
FEV6-Post: 4.98 L
FEV6-Pre: 5.1 L
FEV6FVC-%Change-Post: 0 %
FEV6FVC-%Pred-Post: 104 %
FEV6FVC-%Pred-Pre: 103 %
FVC-%Change-Post: -2 %
FVC-%Pred-Post: 99 %
FVC-%Pred-Pre: 102 %
FVC-Post: 5.03 L
FVC-Pre: 5.18 L
Post FEV1/FVC ratio: 78 %
Post FEV6/FVC ratio: 99 %
Pre FEV1/FVC ratio: 75 %
Pre FEV6/FVC Ratio: 99 %
RV % pred: 74 %
RV: 1.9 L
TLC % pred: 99 %
TLC: 7.6 L

## 2014-09-05 LAB — SURGICAL PCR SCREEN
MRSA, PCR: NEGATIVE
Staphylococcus aureus: NEGATIVE

## 2014-09-05 LAB — BLOOD GAS, ARTERIAL
Acid-Base Excess: 0.9 mmol/L (ref 0.0–2.0)
Bicarbonate: 24.3 mEq/L — ABNORMAL HIGH (ref 20.0–24.0)
Drawn by: 421801
FIO2: 0.21 %
O2 Saturation: 95.5 %
Patient temperature: 98.6
TCO2: 25.4 mmol/L (ref 0–100)
pCO2 arterial: 34.6 mmHg — ABNORMAL LOW (ref 35.0–45.0)
pH, Arterial: 7.461 — ABNORMAL HIGH (ref 7.350–7.450)
pO2, Arterial: 77.4 mmHg — ABNORMAL LOW (ref 80.0–100.0)

## 2014-09-05 LAB — CBC
HCT: 43.8 % (ref 39.0–52.0)
Hemoglobin: 15.3 g/dL (ref 13.0–17.0)
MCH: 31.3 pg (ref 26.0–34.0)
MCHC: 34.9 g/dL (ref 30.0–36.0)
MCV: 89.6 fL (ref 78.0–100.0)
Platelets: 193 10*3/uL (ref 150–400)
RBC: 4.89 MIL/uL (ref 4.22–5.81)
RDW: 12 % (ref 11.5–15.5)
WBC: 6.8 10*3/uL (ref 4.0–10.5)

## 2014-09-05 LAB — COMPREHENSIVE METABOLIC PANEL
ALT: 38 U/L (ref 0–53)
AST: 43 U/L — ABNORMAL HIGH (ref 0–37)
Albumin: 4.1 g/dL (ref 3.5–5.2)
Alkaline Phosphatase: 59 U/L (ref 39–117)
Anion gap: 19 — ABNORMAL HIGH (ref 5–15)
BUN: 13 mg/dL (ref 6–23)
CO2: 19 mEq/L (ref 19–32)
Calcium: 9.3 mg/dL (ref 8.4–10.5)
Chloride: 97 mEq/L (ref 96–112)
Creatinine, Ser: 0.55 mg/dL (ref 0.50–1.35)
GFR calc Af Amer: >90 mL/min (ref >90)
GFR calc non Af Amer: >90 mL/min (ref >90)
Glucose, Bld: 91 mg/dL (ref 70–99)
Potassium: 3.3 mEq/L — ABNORMAL LOW (ref 3.7–5.3)
Sodium: 135 mEq/L — ABNORMAL LOW (ref 137–147)
Total Bilirubin: 0.6 mg/dL (ref 0.3–1.2)
Total Protein: 7.3 g/dL (ref 6.0–8.3)

## 2014-09-05 LAB — HEMOGLOBIN A1C
Hgb A1c MFr Bld: 5.7 % — ABNORMAL HIGH (ref <5.7)
Mean Plasma Glucose: 117 mg/dL — ABNORMAL HIGH (ref <117)

## 2014-09-05 LAB — URINALYSIS, ROUTINE W REFLEX MICROSCOPIC
Bilirubin Urine: NEGATIVE
Glucose, UA: NEGATIVE mg/dL
Hgb urine dipstick: NEGATIVE
Ketones, ur: NEGATIVE mg/dL
Leukocytes, UA: NEGATIVE
Nitrite: NEGATIVE
Protein, ur: NEGATIVE mg/dL
Specific Gravity, Urine: 1.018 (ref 1.005–1.030)
Urobilinogen, UA: 0.2 mg/dL (ref 0.0–1.0)
pH: 6 (ref 5.0–8.0)

## 2014-09-05 LAB — PROTIME-INR
INR: 1.2 (ref 0.00–1.49)
Prothrombin Time: 15.3 seconds — ABNORMAL HIGH (ref 11.6–15.2)

## 2014-09-05 LAB — TYPE AND SCREEN
ABO/RH(D): O POS
Antibody Screen: NEGATIVE

## 2014-09-05 LAB — ABO/RH: ABO/RH(D): O POS

## 2014-09-05 LAB — APTT: aPTT: 28 seconds (ref 24–37)

## 2014-09-05 MED ORDER — METOPROLOL TARTRATE 12.5 MG HALF TABLET
12.5000 mg | ORAL_TABLET | Freq: Once | ORAL | Status: AC
  Administered 2014-09-06: 12.5 mg via ORAL
  Filled 2014-09-05: qty 1

## 2014-09-05 MED ORDER — SODIUM CHLORIDE 0.9 % IV SOLN
INTRAVENOUS | Status: DC
  Filled 2014-09-05: qty 30

## 2014-09-05 MED ORDER — ALBUTEROL SULFATE (2.5 MG/3ML) 0.083% IN NEBU
2.5000 mg | INHALATION_SOLUTION | Freq: Once | RESPIRATORY_TRACT | Status: AC
  Administered 2014-09-05: 2.5 mg via RESPIRATORY_TRACT

## 2014-09-05 MED ORDER — VANCOMYCIN HCL 10 G IV SOLR
1500.0000 mg | INTRAVENOUS | Status: AC
  Administered 2014-09-06: 1500 mg via INTRAVENOUS
  Filled 2014-09-05: qty 1500

## 2014-09-05 MED ORDER — DEXTROSE 5 % IV SOLN
750.0000 mg | INTRAVENOUS | Status: DC
  Filled 2014-09-05: qty 750

## 2014-09-05 MED ORDER — DEXTROSE 5 % IV SOLN
1.5000 g | INTRAVENOUS | Status: AC
  Administered 2014-09-06: 1.5 g via INTRAVENOUS
  Filled 2014-09-05 (×2): qty 1.5

## 2014-09-05 MED ORDER — CHLORHEXIDINE GLUCONATE 4 % EX LIQD: 30.0000 mL | CUTANEOUS | Status: DC

## 2014-09-05 MED ORDER — PAPAVERINE HCL 30 MG/ML IJ SOLN
INTRAMUSCULAR | Status: AC
  Administered 2014-09-06: 500 mL
  Filled 2014-09-05: qty 2.5

## 2014-09-05 MED ORDER — PHENYLEPHRINE HCL 10 MG/ML IJ SOLN
30.0000 ug/min | INTRAVENOUS | Status: AC
  Administered 2014-09-06: 10 ug/min via INTRAVENOUS
  Filled 2014-09-05: qty 2

## 2014-09-05 MED ORDER — MAGNESIUM SULFATE 50 % IJ SOLN
40.0000 meq | INTRAMUSCULAR | Status: DC
  Filled 2014-09-05: qty 10

## 2014-09-05 MED ORDER — POTASSIUM CHLORIDE 2 MEQ/ML IV SOLN
80.0000 meq | INTRAVENOUS | Status: DC
  Filled 2014-09-05: qty 40

## 2014-09-05 MED ORDER — EPINEPHRINE HCL 1 MG/ML IJ SOLN
0.0000 ug/min | INTRAVENOUS | Status: DC
  Filled 2014-09-05: qty 4

## 2014-09-05 MED ORDER — SODIUM CHLORIDE 0.9 % IV SOLN
INTRAVENOUS | Status: AC
  Administered 2014-09-06: 1.4 [IU]/h via INTRAVENOUS
  Filled 2014-09-05: qty 2.5

## 2014-09-05 MED ORDER — SODIUM CHLORIDE 0.9 % IV SOLN
INTRAVENOUS | Status: AC
  Administered 2014-09-06: 69.8 mL/h via INTRAVENOUS
  Filled 2014-09-05: qty 40

## 2014-09-05 MED ORDER — DOPAMINE-DEXTROSE 3.2-5 MG/ML-% IV SOLN
0.0000 ug/kg/min | INTRAVENOUS | Status: DC
  Filled 2014-09-05: qty 250

## 2014-09-05 MED ORDER — NITROGLYCERIN IN D5W 200-5 MCG/ML-% IV SOLN
2.0000 ug/min | INTRAVENOUS | Status: AC
  Administered 2014-09-06: 5 ug/min via INTRAVENOUS
  Filled 2014-09-05: qty 250

## 2014-09-05 MED ORDER — DEXMEDETOMIDINE HCL IN NACL 400 MCG/100ML IV SOLN
0.1000 ug/kg/h | INTRAVENOUS | Status: AC
  Administered 2014-09-06: .3 ug/kg/h via INTRAVENOUS
  Filled 2014-09-05: qty 100

## 2014-09-05 NOTE — Pre-Procedure Instructions (Signed)
[name]  09/05/2014   Your procedure is scheduled on:  Friday, December 11.  Report to [facility] Admitting at 5:30AM.  Call this number if you have problems the morning of surgery: [phone]   Remember:   Do not eat food or drink liquids after midnight.   Take these medicines the morning of surgery with A SIP OF WATER: isosorbide mononitrate (IMDUR).             Take if needed:acetaminophen (TYLENOL).              Stop Aspirin, do not take any Aspirin products do not take Advil or Naproxen.   Do not wear jewelry, make-up or nail polish.  Do not wear lotions, powders, or perfumes.    Men may shave face and neck.  Do not bring valuables to the hospital.               [facility] is not responsible for any belongings or valuables.               Contacts, dentures or bridgework may not be worn into surgery.  Leave suitcase in the car. After surgery it may be brought to your room.  For patients admitted to the hospital, discharge time is determined by your                treatment team.               Patients discharged the day of surgery will not be allowed to drive  Home.   Name and phone number of your driver: -  Special Instructions: Review  [facility] - Preparing For Surgery.   Please read over the following fact sheets that you were given: Pain Booklet, Coughing and Deep Breathing, Blood Transfusion Information and Surgical Site Infection Prevention And Incentive Spirometery.

## 2014-09-05 NOTE — Telephone Encounter (Signed)
[name] wanted to make Dr Gwenlyn Found aware that patient never picked up the imdur from the pharmacy.  He is scheduled for upcoming bypass.  [name] will fill out a SZP.  I will send to Dr Gwenlyn Found as an Juluis Rainier

## 2014-09-06 ENCOUNTER — Inpatient Hospital Stay (HOSPITAL_COMMUNITY): Payer: Medicare HMO

## 2014-09-06 ENCOUNTER — Inpatient Hospital Stay (HOSPITAL_COMMUNITY): Payer: Medicare HMO | Admitting: Anesthesiology

## 2014-09-06 ENCOUNTER — Encounter (HOSPITAL_COMMUNITY): Payer: Self-pay | Admitting: *Deleted

## 2014-09-06 ENCOUNTER — Inpatient Hospital Stay (HOSPITAL_COMMUNITY)
Admission: RE | Admit: 2014-09-06 | Discharge: 2014-09-15 | DRG: 236 | Disposition: A | Discharge status: Home / Self Care | Payer: Medicare HMO | Source: Ambulatory Visit | Attending: Cardiothoracic Surgery | Admitting: Cardiothoracic Surgery

## 2014-09-06 ENCOUNTER — Encounter (HOSPITAL_COMMUNITY)
Admission: RE | Disposition: A | Discharge status: Home / Self Care | Payer: Medicare HMO | Source: Ambulatory Visit | Attending: Cardiothoracic Surgery

## 2014-09-06 DIAGNOSIS — Z7901 Long term (current) use of anticoagulants: Secondary | ICD-10-CM | POA: Diagnosis not present

## 2014-09-06 DIAGNOSIS — Z01811 Encounter for preprocedural respiratory examination: Secondary | ICD-10-CM

## 2014-09-06 DIAGNOSIS — D696 Thrombocytopenia, unspecified: Secondary | ICD-10-CM | POA: Diagnosis not present

## 2014-09-06 DIAGNOSIS — I10 Essential (primary) hypertension: Secondary | ICD-10-CM | POA: Diagnosis present

## 2014-09-06 DIAGNOSIS — Z8673 Personal history of transient ischemic attack (TIA), and cerebral infarction without residual deficits: Secondary | ICD-10-CM

## 2014-09-06 DIAGNOSIS — I2510 Atherosclerotic heart disease of native coronary artery without angina pectoris: Secondary | ICD-10-CM | POA: Diagnosis present

## 2014-09-06 DIAGNOSIS — I4891 Unspecified atrial fibrillation: Secondary | ICD-10-CM

## 2014-09-06 DIAGNOSIS — Z01810 Encounter for preprocedural cardiovascular examination: Secondary | ICD-10-CM | POA: Diagnosis not present

## 2014-09-06 DIAGNOSIS — D62 Acute posthemorrhagic anemia: Secondary | ICD-10-CM | POA: Diagnosis not present

## 2014-09-06 DIAGNOSIS — R112 Nausea with vomiting, unspecified: Secondary | ICD-10-CM | POA: Diagnosis not present

## 2014-09-06 DIAGNOSIS — I25110 Atherosclerotic heart disease of native coronary artery with unstable angina pectoris: Secondary | ICD-10-CM

## 2014-09-06 DIAGNOSIS — E785 Hyperlipidemia, unspecified: Secondary | ICD-10-CM | POA: Diagnosis present

## 2014-09-06 DIAGNOSIS — Z01812 Encounter for preprocedural laboratory examination: Secondary | ICD-10-CM

## 2014-09-06 DIAGNOSIS — I480 Paroxysmal atrial fibrillation: Secondary | ICD-10-CM | POA: Diagnosis not present

## 2014-09-06 DIAGNOSIS — N400 Enlarged prostate without lower urinary tract symptoms: Secondary | ICD-10-CM | POA: Diagnosis present

## 2014-09-06 DIAGNOSIS — E876 Hypokalemia: Secondary | ICD-10-CM | POA: Diagnosis not present

## 2014-09-06 DIAGNOSIS — Z87891 Personal history of nicotine dependence: Secondary | ICD-10-CM | POA: Diagnosis not present

## 2014-09-06 DIAGNOSIS — Z951 Presence of aortocoronary bypass graft: Secondary | ICD-10-CM | POA: Insufficient documentation

## 2014-09-06 DIAGNOSIS — E871 Hypo-osmolality and hyponatremia: Secondary | ICD-10-CM | POA: Diagnosis not present

## 2014-09-06 DIAGNOSIS — R0602 Shortness of breath: Secondary | ICD-10-CM

## 2014-09-06 HISTORY — PX: TEE WITHOUT CARDIOVERSION: SHX5443

## 2014-09-06 HISTORY — PX: CORONARY ARTERY BYPASS GRAFT: SHX141

## 2014-09-06 LAB — POCT I-STAT, CHEM 8
BUN: 6 mg/dL (ref 6–23)
BUN: 8 mg/dL (ref 6–23)
BUN: 7 mg/dL (ref 6–23)
BUN: 7 mg/dL (ref 6–23)
BUN: 8 mg/dL (ref 6–23)
BUN: 7 mg/dL (ref 6–23)
Calcium, Ion: 0.92 mmol/L — ABNORMAL LOW (ref 1.13–1.30)
Calcium, Ion: 1.17 mmol/L (ref 1.13–1.30)
Calcium, Ion: 1.08 mmol/L — ABNORMAL LOW (ref 1.13–1.30)
Calcium, Ion: 0.92 mmol/L — ABNORMAL LOW (ref 1.13–1.30)
Calcium, Ion: 1.08 mmol/L — ABNORMAL LOW (ref 1.13–1.30)
Calcium, Ion: 1.16 mmol/L (ref 1.13–1.30)
Chloride: 97 mEq/L (ref 96–112)
Chloride: 99 mEq/L (ref 96–112)
Chloride: 98 mEq/L (ref 96–112)
Chloride: 96 mEq/L (ref 96–112)
Chloride: 97 mEq/L (ref 96–112)
Chloride: 103 mEq/L (ref 96–112)
Creatinine, Ser: 0.3 mg/dL — ABNORMAL LOW (ref 0.50–1.35)
Creatinine, Ser: 0.5 mg/dL (ref 0.50–1.35)
Creatinine, Ser: 0.4 mg/dL — ABNORMAL LOW (ref 0.50–1.35)
Creatinine, Ser: 0.5 mg/dL (ref 0.50–1.35)
Creatinine, Ser: 0.5 mg/dL (ref 0.50–1.35)
Creatinine, Ser: 0.6 mg/dL (ref 0.50–1.35)
Glucose, Bld: 107 mg/dL — ABNORMAL HIGH (ref 70–99)
Glucose, Bld: 87 mg/dL (ref 70–99)
Glucose, Bld: 119 mg/dL — ABNORMAL HIGH (ref 70–99)
Glucose, Bld: 119 mg/dL — ABNORMAL HIGH (ref 70–99)
Glucose, Bld: 123 mg/dL — ABNORMAL HIGH (ref 70–99)
Glucose, Bld: 138 mg/dL — ABNORMAL HIGH (ref 70–99)
HCT: 28 % — ABNORMAL LOW (ref 39.0–52.0)
HCT: 36 % — ABNORMAL LOW (ref 39.0–52.0)
HCT: 29 % — ABNORMAL LOW (ref 39.0–52.0)
HCT: 26 % — ABNORMAL LOW (ref 39.0–52.0)
HCT: 27 % — ABNORMAL LOW (ref 39.0–52.0)
HCT: 31 % — ABNORMAL LOW (ref 39.0–52.0)
Hemoglobin: 12.2 g/dL — ABNORMAL LOW (ref 13.0–17.0)
Hemoglobin: 9.5 g/dL — ABNORMAL LOW (ref 13.0–17.0)
Hemoglobin: 9.9 g/dL — ABNORMAL LOW (ref 13.0–17.0)
Hemoglobin: 8.8 g/dL — ABNORMAL LOW (ref 13.0–17.0)
Hemoglobin: 9.2 g/dL — ABNORMAL LOW (ref 13.0–17.0)
Hemoglobin: 10.5 g/dL — ABNORMAL LOW (ref 13.0–17.0)
Potassium: 3 mEq/L — ABNORMAL LOW (ref 3.7–5.3)
Potassium: 3.3 mEq/L — ABNORMAL LOW (ref 3.7–5.3)
Potassium: 3.5 mEq/L — ABNORMAL LOW (ref 3.7–5.3)
Potassium: 3.4 mEq/L — ABNORMAL LOW (ref 3.7–5.3)
Potassium: 4 mEq/L (ref 3.7–5.3)
Potassium: 3.2 mEq/L — ABNORMAL LOW (ref 3.7–5.3)
Sodium: 137 mEq/L (ref 137–147)
Sodium: 138 mEq/L (ref 137–147)
Sodium: 135 mEq/L — ABNORMAL LOW (ref 137–147)
Sodium: 133 mEq/L — ABNORMAL LOW (ref 137–147)
Sodium: 134 mEq/L — ABNORMAL LOW (ref 137–147)
Sodium: 137 mEq/L (ref 137–147)
TCO2: 23 mmol/L (ref 0–100)
TCO2: 24 mmol/L (ref 0–100)
TCO2: 26 mmol/L (ref 0–100)
TCO2: 23 mmol/L (ref 0–100)
TCO2: 25 mmol/L (ref 0–100)
TCO2: 19 mmol/L (ref 0–100)

## 2014-09-06 LAB — POCT I-STAT 3, ART BLOOD GAS (G3+)
Acid-Base Excess: 3 mmol/L — ABNORMAL HIGH (ref 0.0–2.0)
Acid-Base Excess: 2 mmol/L (ref 0.0–2.0)
Acid-base deficit: 3 mmol/L — ABNORMAL HIGH (ref 0.0–2.0)
Acid-base deficit: 3 mmol/L — ABNORMAL HIGH (ref 0.0–2.0)
Bicarbonate: 26.3 mEq/L — ABNORMAL HIGH (ref 20.0–24.0)
Bicarbonate: 26.6 mEq/L — ABNORMAL HIGH (ref 20.0–24.0)
Bicarbonate: 24.4 mEq/L — ABNORMAL HIGH (ref 20.0–24.0)
Bicarbonate: 22.8 mEq/L (ref 20.0–24.0)
Bicarbonate: 21.9 mEq/L (ref 20.0–24.0)
O2 Saturation: 100 %
O2 Saturation: 100 %
O2 Saturation: 98 %
O2 Saturation: 98 %
O2 Saturation: 98 %
Patient temperature: 36
Patient temperature: 37.5
Patient temperature: 37.7
TCO2: 27 mmol/L (ref 0–100)
TCO2: 28 mmol/L (ref 0–100)
TCO2: 25 mmol/L (ref 0–100)
TCO2: 24 mmol/L (ref 0–100)
TCO2: 23 mmol/L (ref 0–100)
pCO2 arterial: 36.5 mmHg (ref 35.0–45.0)
pCO2 arterial: 41.6 mmHg (ref 35.0–45.0)
pCO2 arterial: 35.8 mmHg (ref 35.0–45.0)
pCO2 arterial: 41.9 mmHg (ref 35.0–45.0)
pCO2 arterial: 39.7 mmHg (ref 35.0–45.0)
pH, Arterial: 7.467 — ABNORMAL HIGH (ref 7.350–7.450)
pH, Arterial: 7.414 (ref 7.350–7.450)
pH, Arterial: 7.437 (ref 7.350–7.450)
pH, Arterial: 7.346 — ABNORMAL LOW (ref 7.350–7.450)
pH, Arterial: 7.353 (ref 7.350–7.450)
pO2, Arterial: 387 mmHg — ABNORMAL HIGH (ref 80.0–100.0)
pO2, Arterial: 310 mmHg — ABNORMAL HIGH (ref 80.0–100.0)
pO2, Arterial: 91 mmHg (ref 80.0–100.0)
pO2, Arterial: 112 mmHg — ABNORMAL HIGH (ref 80.0–100.0)
pO2, Arterial: 111 mmHg — ABNORMAL HIGH (ref 80.0–100.0)

## 2014-09-06 LAB — CBC
HCT: 32.1 % — ABNORMAL LOW (ref 39.0–52.0)
HEMATOCRIT: 28.6 % — AB (ref 39.0–52.0)
Hemoglobin: 10 g/dL — ABNORMAL LOW (ref 13.0–17.0)
Hemoglobin: 10.7 g/dL — ABNORMAL LOW (ref 13.0–17.0)
MCH: 31.3 pg (ref 26.0–34.0)
MCH: 30.4 pg (ref 26.0–34.0)
MCHC: 35 g/dL (ref 30.0–36.0)
MCHC: 33.3 g/dL (ref 30.0–36.0)
MCV: 89.4 fL (ref 78.0–100.0)
MCV: 91.2 fL (ref 78.0–100.0)
Platelets: 133 10*3/uL — ABNORMAL LOW (ref 150–400)
Platelets: 154 10*3/uL (ref 150–400)
RBC: 3.2 MIL/uL — ABNORMAL LOW (ref 4.22–5.81)
RBC: 3.52 MIL/uL — ABNORMAL LOW (ref 4.22–5.81)
RDW: 12.1 % (ref 11.5–15.5)
RDW: 12.2 % (ref 11.5–15.5)
WBC: 6.8 10*3/uL (ref 4.0–10.5)
WBC: 8 10*3/uL (ref 4.0–10.5)

## 2014-09-06 LAB — POCT I-STAT GLUCOSE
Glucose, Bld: 112 mg/dL — ABNORMAL HIGH (ref 70–99)
Operator id: 173792

## 2014-09-06 LAB — CREATININE, SERUM
Creatinine, Ser: 0.57 mg/dL (ref 0.50–1.35)
GFR calc Af Amer: >90 mL/min (ref >90)
GFR calc non Af Amer: >90 mL/min (ref >90)

## 2014-09-06 LAB — HEMOGLOBIN AND HEMATOCRIT, BLOOD
HCT: 30.3 % — ABNORMAL LOW (ref 39.0–52.0)
Hemoglobin: 10.8 g/dL — ABNORMAL LOW (ref 13.0–17.0)

## 2014-09-06 LAB — PROTIME-INR
INR: 1.69 — ABNORMAL HIGH (ref 0.00–1.49)
Prothrombin Time: 20.1 seconds — ABNORMAL HIGH (ref 11.6–15.2)

## 2014-09-06 LAB — GLUCOSE, CAPILLARY
Glucose-Capillary: 96 mg/dL (ref 70–99)
Glucose-Capillary: 104 mg/dL — ABNORMAL HIGH (ref 70–99)
Glucose-Capillary: 119 mg/dL — ABNORMAL HIGH (ref 70–99)
Glucose-Capillary: 129 mg/dL — ABNORMAL HIGH (ref 70–99)

## 2014-09-06 LAB — MAGNESIUM: Magnesium: 2.9 mg/dL — ABNORMAL HIGH (ref 1.5–2.5)

## 2014-09-06 LAB — POCT I-STAT 4, (NA,K, GLUC, HGB,HCT)
Glucose, Bld: 83 mg/dL (ref 70–99)
HCT: 30 % — ABNORMAL LOW (ref 39.0–52.0)
Hemoglobin: 10.2 g/dL — ABNORMAL LOW (ref 13.0–17.0)
Potassium: 2.8 mEq/L — CL (ref 3.7–5.3)
Sodium: 138 mEq/L (ref 137–147)

## 2014-09-06 LAB — PLATELET COUNT: Platelets: 138 10*3/uL — ABNORMAL LOW (ref 150–400)

## 2014-09-06 LAB — APTT: APTT: 31 s (ref 24–37)

## 2014-09-06 SURGERY — CORONARY ARTERY BYPASS GRAFTING (CABG)
Anesthesia: General | Site: Chest

## 2014-09-06 MED ORDER — 0.9 % SODIUM CHLORIDE (POUR BTL) OPTIME
TOPICAL | Status: DC | PRN
  Administered 2014-09-06: 6000 mL

## 2014-09-06 MED ORDER — DEXTROSE 5 % IV SOLN
1.5000 g | Freq: Two times a day (BID) | INTRAVENOUS | Status: AC
  Administered 2014-09-06 – 2014-09-08 (×4): 1.5 g via INTRAVENOUS
  Filled 2014-09-06 (×4): qty 1.5

## 2014-09-06 MED ORDER — POTASSIUM CHLORIDE 10 MEQ/50ML IV SOLN
10.0000 meq | INTRAVENOUS | Status: AC
  Administered 2014-09-06 (×2): 10 meq via INTRAVENOUS

## 2014-09-06 MED ORDER — PROTAMINE SULFATE 10 MG/ML IV SOLN
INTRAVENOUS | Status: AC
  Filled 2014-09-06: qty 5

## 2014-09-06 MED ORDER — PROPOFOL 10 MG/ML IV BOLUS
INTRAVENOUS | Status: AC
  Filled 2014-09-06: qty 20

## 2014-09-06 MED ORDER — SODIUM CHLORIDE 0.9 % IJ SOLN
3.0000 mL | INTRAMUSCULAR | Status: DC | PRN
  Administered 2014-09-07: 3 mL via INTRAVENOUS
  Filled 2014-09-06: qty 3

## 2014-09-06 MED ORDER — PANTOPRAZOLE SODIUM 40 MG PO TBEC
40.0000 mg | DELAYED_RELEASE_TABLET | Freq: Every day | ORAL | Status: DC
  Administered 2014-09-10 – 2014-09-15 (×5): 40 mg via ORAL
  Filled 2014-09-06 (×7): qty 1

## 2014-09-06 MED ORDER — MIDAZOLAM HCL 10 MG/2ML IJ SOLN
INTRAMUSCULAR | Status: AC
  Filled 2014-09-06: qty 2

## 2014-09-06 MED ORDER — SODIUM CHLORIDE 0.9 % IV SOLN
INTRAVENOUS | Status: DC
  Administered 2014-09-06: 15:00:00 via INTRAVENOUS

## 2014-09-06 MED ORDER — FENTANYL CITRATE 0.05 MG/ML IJ SOLN
INTRAMUSCULAR | Status: AC
  Filled 2014-09-06: qty 5

## 2014-09-06 MED ORDER — KETOROLAC TROMETHAMINE 15 MG/ML IJ SOLN
15.0000 mg | Freq: Four times a day (QID) | INTRAMUSCULAR | Status: DC
  Administered 2014-09-06 – 2014-09-08 (×7): 15 mg via INTRAVENOUS
  Filled 2014-09-06 (×10): qty 1

## 2014-09-06 MED ORDER — FAMOTIDINE IN NACL 20-0.9 MG/50ML-% IV SOLN
20.0000 mg | Freq: Two times a day (BID) | INTRAVENOUS | Status: DC
  Administered 2014-09-06: 20 mg via INTRAVENOUS

## 2014-09-06 MED ORDER — ROCURONIUM BROMIDE 100 MG/10ML IV SOLN
INTRAVENOUS | Status: DC | PRN
  Administered 2014-09-06 (×5): 50 mg via INTRAVENOUS

## 2014-09-06 MED ORDER — MIDAZOLAM HCL 2 MG/2ML IJ SOLN: 2.0000 mg | INTRAMUSCULAR | Status: DC | PRN

## 2014-09-06 MED ORDER — BISACODYL 5 MG PO TBEC
10.0000 mg | DELAYED_RELEASE_TABLET | Freq: Every day | ORAL | Status: DC
  Administered 2014-09-07: 10 mg via ORAL
  Filled 2014-09-06 (×2): qty 2

## 2014-09-06 MED ORDER — LACTATED RINGERS IV SOLN
INTRAVENOUS | Status: DC | PRN
  Administered 2014-09-06: 07:00:00 via INTRAVENOUS

## 2014-09-06 MED ORDER — DEXTROSE 5 % IV SOLN
0.0000 ug/min | INTRAVENOUS | Status: DC
  Administered 2014-09-06: 5 ug/min via INTRAVENOUS
  Administered 2014-09-07: 25 ug/min via INTRAVENOUS
  Filled 2014-09-06: qty 2

## 2014-09-06 MED ORDER — SODIUM CHLORIDE 0.45 % IV SOLN
INTRAVENOUS | Status: DC
  Administered 2014-09-06: 15:00:00 via INTRAVENOUS
  Administered 2014-09-07: 20 mL via INTRAVENOUS

## 2014-09-06 MED ORDER — DOPAMINE-DEXTROSE 3.2-5 MG/ML-% IV SOLN: 0.0000 ug/kg/min | INTRAVENOUS | Status: DC

## 2014-09-06 MED ORDER — CETYLPYRIDINIUM CHLORIDE 0.05 % MT LIQD
7.0000 mL | Freq: Four times a day (QID) | OROMUCOSAL | Status: DC
  Administered 2014-09-06 – 2014-09-14 (×10): 7 mL via OROMUCOSAL

## 2014-09-06 MED ORDER — OXYCODONE HCL 5 MG PO TABS
5.0000 mg | ORAL_TABLET | ORAL | Status: DC | PRN
  Administered 2014-09-06 – 2014-09-07 (×3): 10 mg via ORAL
  Filled 2014-09-06 (×3): qty 2

## 2014-09-06 MED ORDER — ATORVASTATIN CALCIUM 20 MG PO TABS
20.0000 mg | ORAL_TABLET | Freq: Every day | ORAL | Status: DC
  Administered 2014-09-07 – 2014-09-14 (×6): 20 mg via ORAL
  Filled 2014-09-06 (×9): qty 1

## 2014-09-06 MED ORDER — METOPROLOL TARTRATE 12.5 MG HALF TABLET
12.5000 mg | ORAL_TABLET | Freq: Two times a day (BID) | ORAL | Status: DC
  Administered 2014-09-07: 12.5 mg via ORAL
  Filled 2014-09-06 (×7): qty 1

## 2014-09-06 MED ORDER — ACETAMINOPHEN 650 MG RE SUPP
650.0000 mg | Freq: Once | RECTAL | Status: AC
  Administered 2014-09-06: 650 mg via RECTAL

## 2014-09-06 MED ORDER — HEPARIN SODIUM (PORCINE) 1000 UNIT/ML IJ SOLN
INTRAMUSCULAR | Status: AC
  Filled 2014-09-06: qty 1

## 2014-09-06 MED ORDER — CHLORHEXIDINE GLUCONATE 0.12 % MT SOLN
15.0000 mL | Freq: Two times a day (BID) | OROMUCOSAL | Status: DC
  Administered 2014-09-06: 15 mL via OROMUCOSAL
  Filled 2014-09-06 (×2): qty 15

## 2014-09-06 MED ORDER — SODIUM CHLORIDE 0.9 % IJ SOLN
3.0000 mL | Freq: Two times a day (BID) | INTRAMUSCULAR | Status: DC
  Administered 2014-09-07 – 2014-09-10 (×4): 3 mL via INTRAVENOUS

## 2014-09-06 MED ORDER — HEPARIN SODIUM (PORCINE) 1000 UNIT/ML IJ SOLN
INTRAMUSCULAR | Status: DC | PRN
  Administered 2014-09-06: 22000 [IU] via INTRAVENOUS
  Administered 2014-09-06: 3000 [IU] via INTRAVENOUS
  Administered 2014-09-06: 2000 [IU] via INTRAVENOUS

## 2014-09-06 MED ORDER — HEMOSTATIC AGENTS (NO CHARGE) OPTIME
TOPICAL | Status: DC | PRN
  Administered 2014-09-06: 1 via TOPICAL

## 2014-09-06 MED ORDER — LIDOCAINE HCL (CARDIAC) 20 MG/ML IV SOLN
INTRAVENOUS | Status: AC
  Filled 2014-09-06: qty 5

## 2014-09-06 MED ORDER — METOPROLOL TARTRATE 25 MG/10 ML ORAL SUSPENSION
12.5000 mg | Freq: Two times a day (BID) | ORAL | Status: DC
  Filled 2014-09-06 (×7): qty 5

## 2014-09-06 MED ORDER — POTASSIUM CHLORIDE 10 MEQ/50ML IV SOLN
INTRAVENOUS | Status: AC
  Administered 2014-09-06: 10 meq via INTRAVENOUS
  Filled 2014-09-06: qty 150

## 2014-09-06 MED ORDER — MIDAZOLAM HCL 2 MG/2ML IJ SOLN
INTRAMUSCULAR | Status: AC
  Filled 2014-09-06: qty 2

## 2014-09-06 MED ORDER — LACTATED RINGERS IV SOLN
INTRAVENOUS | Status: DC
  Administered 2014-09-06: 15:00:00 via INTRAVENOUS
  Administered 2014-09-07: 20 mL via INTRAVENOUS

## 2014-09-06 MED ORDER — INSULIN ASPART 100 UNIT/ML ~~LOC~~ SOLN
0.0000 [IU] | SUBCUTANEOUS | Status: DC
  Administered 2014-09-06 – 2014-09-08 (×4): 2 [IU] via SUBCUTANEOUS

## 2014-09-06 MED ORDER — ACETAMINOPHEN 500 MG PO TABS
1000.0000 mg | ORAL_TABLET | Freq: Four times a day (QID) | ORAL | Status: AC
  Administered 2014-09-06 – 2014-09-07 (×4): 1000 mg via ORAL
  Filled 2014-09-06 (×20): qty 2

## 2014-09-06 MED ORDER — VANCOMYCIN HCL IN DEXTROSE 1-5 GM/200ML-% IV SOLN
1000.0000 mg | Freq: Once | INTRAVENOUS | Status: AC
  Administered 2014-09-06: 1000 mg via INTRAVENOUS
  Filled 2014-09-06: qty 200

## 2014-09-06 MED ORDER — SODIUM CHLORIDE 0.9 % IJ SOLN
OROMUCOSAL | Status: DC | PRN
  Administered 2014-09-06 (×3): 1000 mL via TOPICAL

## 2014-09-06 MED ORDER — MAGNESIUM SULFATE 4 GM/100ML IV SOLN
4.0000 g | Freq: Once | INTRAVENOUS | Status: AC
  Administered 2014-09-06: 4 g via INTRAVENOUS
  Filled 2014-09-06: qty 100

## 2014-09-06 MED ORDER — PROTAMINE SULFATE 10 MG/ML IV SOLN
INTRAVENOUS | Status: DC | PRN
  Administered 2014-09-06: 270 mg via INTRAVENOUS

## 2014-09-06 MED ORDER — ROCURONIUM BROMIDE 50 MG/5ML IV SOLN
INTRAVENOUS | Status: AC
  Filled 2014-09-06: qty 2

## 2014-09-06 MED ORDER — METOPROLOL TARTRATE 1 MG/ML IV SOLN
2.5000 mg | INTRAVENOUS | Status: DC | PRN
  Administered 2014-09-10: 5 mg via INTRAVENOUS
  Administered 2014-09-12: 2.5 mg via INTRAVENOUS
  Filled 2014-09-06 (×3): qty 5

## 2014-09-06 MED ORDER — ALBUMIN HUMAN 5 % IV SOLN
INTRAVENOUS | Status: DC | PRN
  Administered 2014-09-06: 12:00:00 via INTRAVENOUS

## 2014-09-06 MED ORDER — MIDAZOLAM HCL 5 MG/5ML IJ SOLN
INTRAMUSCULAR | Status: DC | PRN
  Administered 2014-09-06: 2 mg via INTRAVENOUS
  Administered 2014-09-06: 5 mg via INTRAVENOUS
  Administered 2014-09-06: 2 mg via INTRAVENOUS
  Administered 2014-09-06: 1 mg via INTRAVENOUS
  Administered 2014-09-06 (×2): 2 mg via INTRAVENOUS

## 2014-09-06 MED ORDER — SODIUM CHLORIDE 0.9 % IV SOLN
20.0000 ug | INTRAVENOUS | Status: AC
  Administered 2014-09-06: 20 ug via INTRAVENOUS
  Filled 2014-09-06: qty 5

## 2014-09-06 MED ORDER — POTASSIUM CHLORIDE 10 MEQ/50ML IV SOLN
10.0000 meq | INTRAVENOUS | Status: AC
  Administered 2014-09-06 (×3): 10 meq via INTRAVENOUS

## 2014-09-06 MED ORDER — SODIUM CHLORIDE 0.9 % IV SOLN: INTRAVENOUS | Status: DC

## 2014-09-06 MED ORDER — FENTANYL CITRATE 0.05 MG/ML IJ SOLN
INTRAMUSCULAR | Status: DC | PRN
  Administered 2014-09-06: 500 ug via INTRAVENOUS
  Administered 2014-09-06: 50 ug via INTRAVENOUS
  Administered 2014-09-06: 500 ug via INTRAVENOUS
  Administered 2014-09-06 (×2): 250 ug via INTRAVENOUS
  Administered 2014-09-06: 150 ug via INTRAVENOUS
  Administered 2014-09-06: 50 ug via INTRAVENOUS
  Administered 2014-09-06: 100 ug via INTRAVENOUS
  Administered 2014-09-06: 50 ug via INTRAVENOUS
  Administered 2014-09-06: 900 ug via INTRAVENOUS
  Administered 2014-09-06: 50 ug via INTRAVENOUS

## 2014-09-06 MED ORDER — ONDANSETRON HCL 4 MG/2ML IJ SOLN
4.0000 mg | Freq: Four times a day (QID) | INTRAMUSCULAR | Status: DC | PRN
  Administered 2014-09-06 – 2014-09-10 (×7): 4 mg via INTRAVENOUS
  Filled 2014-09-06 (×8): qty 2

## 2014-09-06 MED ORDER — TRAMADOL HCL 50 MG PO TABS: 50.0000 mg | ORAL_TABLET | ORAL | Status: DC | PRN

## 2014-09-06 MED ORDER — NITROGLYCERIN IN D5W 200-5 MCG/ML-% IV SOLN: 0.0000 ug/min | INTRAVENOUS | Status: DC

## 2014-09-06 MED ORDER — INSULIN REGULAR BOLUS VIA INFUSION
0.0000 [IU] | Freq: Three times a day (TID) | INTRAVENOUS | Status: DC
  Filled 2014-09-06: qty 10

## 2014-09-06 MED ORDER — ASPIRIN EC 81 MG PO TBEC
81.0000 mg | DELAYED_RELEASE_TABLET | Freq: Every day | ORAL | Status: DC
  Administered 2014-09-07 – 2014-09-15 (×7): 81 mg via ORAL
  Filled 2014-09-06 (×9): qty 1

## 2014-09-06 MED ORDER — SODIUM CHLORIDE 0.9 % IJ SOLN
INTRAMUSCULAR | Status: AC
  Filled 2014-09-06: qty 20

## 2014-09-06 MED ORDER — DEXMEDETOMIDINE HCL IN NACL 200 MCG/50ML IV SOLN
0.0000 ug/kg/h | INTRAVENOUS | Status: DC
  Administered 2014-09-06: 0.5 ug/kg/h via INTRAVENOUS

## 2014-09-06 MED ORDER — ACETAMINOPHEN 160 MG/5ML PO SOLN: 650.0000 mg | Freq: Once | ORAL | Status: AC

## 2014-09-06 MED ORDER — SODIUM CHLORIDE 0.9 % IV SOLN: 250.0000 mL | INTRAVENOUS | Status: DC

## 2014-09-06 MED ORDER — MORPHINE SULFATE 2 MG/ML IJ SOLN
1.0000 mg | INTRAMUSCULAR | Status: DC | PRN
  Administered 2014-09-06: 2 mg via INTRAVENOUS
  Administered 2014-09-06: 4 mg via INTRAVENOUS

## 2014-09-06 MED ORDER — ACETAMINOPHEN 160 MG/5ML PO SOLN
1000.0000 mg | Freq: Four times a day (QID) | ORAL | Status: AC
Start: 2014-09-07 — End: 2014-09-11
  Filled 2014-09-06: qty 40

## 2014-09-06 MED ORDER — LACTATED RINGERS IV SOLN
INTRAVENOUS | Status: DC | PRN
  Administered 2014-09-06 (×2): via INTRAVENOUS

## 2014-09-06 MED ORDER — PROPOFOL 10 MG/ML IV BOLUS
INTRAVENOUS | Status: DC | PRN
  Administered 2014-09-06: 100 mg via INTRAVENOUS

## 2014-09-06 MED ORDER — LACTATED RINGERS IV SOLN: 500.0000 mL | Freq: Once | INTRAVENOUS | Status: DC | PRN

## 2014-09-06 MED ORDER — DOCUSATE SODIUM 100 MG PO CAPS
200.0000 mg | ORAL_CAPSULE | Freq: Every day | ORAL | Status: DC
  Administered 2014-09-07: 200 mg via ORAL
  Filled 2014-09-06 (×3): qty 2

## 2014-09-06 MED ORDER — MORPHINE SULFATE 2 MG/ML IJ SOLN
2.0000 mg | INTRAMUSCULAR | Status: DC | PRN
  Administered 2014-09-06 – 2014-09-07 (×4): 4 mg via INTRAVENOUS
  Administered 2014-09-07: 2 mg via INTRAVENOUS
  Administered 2014-09-07 – 2014-09-08 (×2): 4 mg via INTRAVENOUS
  Filled 2014-09-06: qty 2
  Filled 2014-09-06: qty 1
  Filled 2014-09-06 (×6): qty 2
  Filled 2014-09-06: qty 1

## 2014-09-06 MED ORDER — ALBUMIN HUMAN 5 % IV SOLN
250.0000 mL | INTRAVENOUS | Status: AC | PRN
  Administered 2014-09-06 – 2014-09-07 (×3): 250 mL via INTRAVENOUS

## 2014-09-06 MED ORDER — POTASSIUM CHLORIDE 10 MEQ/50ML IV SOLN
10.0000 meq | INTRAVENOUS | Status: AC | PRN
  Administered 2014-09-06 (×3): 10 meq via INTRAVENOUS

## 2014-09-06 MED ORDER — BISACODYL 10 MG RE SUPP: 10.0000 mg | Freq: Every day | RECTAL | Status: DC

## 2014-09-06 SURGICAL SUPPLY — 95 items
ADAPTER CARDIO PERF ANTE/RETRO (ADAPTER) ×4 IMPLANT
ADPR PRFSN 84XANTGRD RTRGD (ADAPTER) ×2
ATTRACTOMAT 16X20 MAGNETIC DRP (DRAPES) ×4 IMPLANT
BAG DECANTER FOR FLEXI CONT (MISCELLANEOUS) ×4 IMPLANT
BANDAGE ELASTIC 4 VELCRO ST LF (GAUZE/BANDAGES/DRESSINGS) ×4 IMPLANT
BANDAGE ELASTIC 6 VELCRO ST LF (GAUZE/BANDAGES/DRESSINGS) ×4 IMPLANT
BASKET HEART  (ORDER IN 25'S) (MISCELLANEOUS) ×1
BASKET HEART (ORDER IN 25'S) (MISCELLANEOUS) ×1
BASKET HEART (ORDER IN 25S) (MISCELLANEOUS) ×2 IMPLANT
BLADE STERNUM SYSTEM 6 (BLADE) ×4 IMPLANT
BLADE SURG 11 STRL SS (BLADE) ×2 IMPLANT
BLADE SURG 12 STRL SS (BLADE) ×4 IMPLANT
BLADE SURG ROTATE 9660 (MISCELLANEOUS) IMPLANT
BNDG ADH 5X4 AIR PERM ELC (GAUZE/BANDAGES/DRESSINGS) ×2
BNDG COHESIVE 4X5 WHT NS (GAUZE/BANDAGES/DRESSINGS) ×2 IMPLANT
BNDG GAUZE ELAST 4 BULKY (GAUZE/BANDAGES/DRESSINGS) ×4 IMPLANT
CANISTER SUCTION 2500CC (MISCELLANEOUS) ×4 IMPLANT
CANNULA GUNDRY RCSP 15FR (MISCELLANEOUS) ×4 IMPLANT
CATH CPB KIT VANTRIGT (MISCELLANEOUS) ×4 IMPLANT
CATH ROBINSON RED A/P 18FR (CATHETERS) ×12 IMPLANT
CATH THORACIC 36FR RT ANG (CATHETERS) ×4 IMPLANT
CLIP FOGARTY SPRING 6M (CLIP) ×3 IMPLANT
CLIP TI WIDE RED SMALL 24 (CLIP) ×4 IMPLANT
COVER SURGICAL LIGHT HANDLE (MISCELLANEOUS) ×4 IMPLANT
CRADLE DONUT ADULT HEAD (MISCELLANEOUS) ×4 IMPLANT
DRAIN CHANNEL 32F RND 10.7 FF (WOUND CARE) ×4 IMPLANT
DRAPE CARDIOVASCULAR INCISE (DRAPES) ×4
DRAPE SLUSH/WARMER DISC (DRAPES) ×4 IMPLANT
DRAPE SRG 135X102X78XABS (DRAPES) ×2 IMPLANT
DRSG AQUACEL AG ADV 3.5X14 (GAUZE/BANDAGES/DRESSINGS) ×4 IMPLANT
DRSG COVADERM 4X10 (GAUZE/BANDAGES/DRESSINGS) ×3 IMPLANT
ELECT BLADE 4.0 EZ CLEAN MEGAD (MISCELLANEOUS) ×4
ELECT BLADE 6.5 EXT (BLADE) ×4 IMPLANT
ELECT CAUTERY BLADE 6.4 (BLADE) ×4 IMPLANT
ELECT REM PT RETURN 9FT ADLT (ELECTROSURGICAL) ×8
ELECTRODE BLDE 4.0 EZ CLN MEGD (MISCELLANEOUS) ×2 IMPLANT
ELECTRODE REM PT RTRN 9FT ADLT (ELECTROSURGICAL) ×4 IMPLANT
GAUZE SPONGE 4X4 12PLY STRL (GAUZE/BANDAGES/DRESSINGS) ×8 IMPLANT
GLOVE BIO SURGEON STRL SZ7.5 (GLOVE) ×12 IMPLANT
GLOVE BIOGEL PI IND STRL 6.5 (GLOVE) ×2 IMPLANT
GLOVE BIOGEL PI INDICATOR 6.5 (GLOVE) ×4
GOWN STRL REUS W/ TWL LRG LVL3 (GOWN DISPOSABLE) ×8 IMPLANT
GOWN STRL REUS W/TWL LRG LVL3 (GOWN DISPOSABLE) ×16
HEMOSTAT POWDER SURGIFOAM 1G (HEMOSTASIS) ×12 IMPLANT
HEMOSTAT SURGICEL 2X14 (HEMOSTASIS) ×4 IMPLANT
INSERT FOGARTY XLG (MISCELLANEOUS) IMPLANT
KIT BASIN OR (CUSTOM PROCEDURE TRAY) ×4 IMPLANT
KIT ROOM TURNOVER OR (KITS) ×4 IMPLANT
KIT SUCTION CATH 14FR (SUCTIONS) ×4 IMPLANT
KIT VASOVIEW W/TROCAR VH 2000 (KITS) ×4 IMPLANT
LEAD PACING MYOCARDI (MISCELLANEOUS) ×4 IMPLANT
MARKER GRAFT CORONARY BYPASS (MISCELLANEOUS) ×12 IMPLANT
NS IRRIG 1000ML POUR BTL (IV SOLUTION) ×23 IMPLANT
PACK OPEN HEART (CUSTOM PROCEDURE TRAY) ×4 IMPLANT
PAD ARMBOARD 7.5X6 YLW CONV (MISCELLANEOUS) ×8 IMPLANT
PAD ELECT DEFIB RADIOL ZOLL (MISCELLANEOUS) ×4 IMPLANT
PENCIL BUTTON HOLSTER BLD 10FT (ELECTRODE) ×4 IMPLANT
PUNCH AORTIC ROTATE 4.0MM (MISCELLANEOUS) ×3 IMPLANT
PUNCH AORTIC ROTATE 4.5MM 8IN (MISCELLANEOUS) IMPLANT
PUNCH AORTIC ROTATE 5MM 8IN (MISCELLANEOUS) IMPLANT
SET CARDIOPLEGIA MPS 5001102 (MISCELLANEOUS) ×2 IMPLANT
SPONGE GAUZE 4X4 12PLY STER LF (GAUZE/BANDAGES/DRESSINGS) ×2 IMPLANT
SURGIFLO W/THROMBIN 8M KIT (HEMOSTASIS) ×4 IMPLANT
SUT BONE WAX W31G (SUTURE) ×4 IMPLANT
SUT MNCRL AB 4-0 PS2 18 (SUTURE) ×2 IMPLANT
SUT PROLENE 3 0 SH DA (SUTURE) ×12 IMPLANT
SUT PROLENE 3 0 SH1 36 (SUTURE) IMPLANT
SUT PROLENE 4 0 RB 1 (SUTURE) ×4
SUT PROLENE 4 0 SH DA (SUTURE) ×4 IMPLANT
SUT PROLENE 4-0 RB1 .5 CRCL 36 (SUTURE) ×2 IMPLANT
SUT PROLENE 5 0 C 1 36 (SUTURE) IMPLANT
SUT PROLENE 6 0 C 1 30 (SUTURE) IMPLANT
SUT PROLENE 6 0 CC (SUTURE) ×12 IMPLANT
SUT PROLENE 8 0 BV175 6 (SUTURE) IMPLANT
SUT PROLENE BLUE 7 0 (SUTURE) ×12 IMPLANT
SUT SILK  1 MH (SUTURE)
SUT SILK 1 MH (SUTURE) IMPLANT
SUT SILK 2 0 SH CR/8 (SUTURE) IMPLANT
SUT SILK 3 0 SH CR/8 (SUTURE) ×3 IMPLANT
SUT STEEL 6MS V (SUTURE) ×8 IMPLANT
SUT STEEL SZ 6 DBL 3X14 BALL (SUTURE) ×4 IMPLANT
SUT VIC AB 1 CTX 36 (SUTURE) ×8
SUT VIC AB 1 CTX36XBRD ANBCTR (SUTURE) ×4 IMPLANT
SUT VIC AB 2-0 CT1 27 (SUTURE) ×4
SUT VIC AB 2-0 CT1 TAPERPNT 27 (SUTURE) IMPLANT
SUT VIC AB 2-0 CTX 27 (SUTURE) IMPLANT
SUT VIC AB 3-0 X1 27 (SUTURE) IMPLANT
SUTURE E-PAK OPEN HEART (SUTURE) ×4 IMPLANT
SYSTEM SAHARA CHEST DRAIN ATS (WOUND CARE) ×4 IMPLANT
TOWEL OR 17X24 6PK STRL BLUE (TOWEL DISPOSABLE) ×8 IMPLANT
TOWEL OR 17X26 10 PK STRL BLUE (TOWEL DISPOSABLE) ×8 IMPLANT
TRAY FOLEY IC TEMP SENS 16FR (CATHETERS) ×4 IMPLANT
TUBING INSUFFLATION (TUBING) ×4 IMPLANT
UNDERPAD 30X30 INCONTINENT (UNDERPADS AND DIAPERS) ×4 IMPLANT
WATER STERILE IRR 1000ML POUR (IV SOLUTION) ×8 IMPLANT

## 2014-09-06 NOTE — Progress Notes (Signed)
Utilization review completed.  

## 2014-09-06 NOTE — Progress Notes (Signed)
The patient was examined and preop studies reviewed. There has been no change from the prior exam and the patient is ready for surgery.   Plan CABG on D [place] today

## 2014-09-06 NOTE — Transfer of Care (Signed)
Immediate Anesthesia Transfer of Care Note  Patient: ACXEL DINGEE  Procedure(s) Performed: Procedure(s) with comments: CORONARY ARTERY BYPASS GRAFTING (CABG) (N/A) - CABG times 4, using internal mammary artery, and right leg saphenous vein harvested endoscopically TRANSESOPHAGEAL ECHOCARDIOGRAM (TEE) (N/A)  Patient Location: SICU  Anesthesia Type:General  Level of Consciousness: Patient remains intubated per anesthesia plan  Airway & Oxygen Therapy: Patient remains intubated per anesthesia plan and Patient placed on Ventilator (see vital sign flow sheet for setting)  Post-op Assessment: Report given to PACU RN  Post vital signs: Reviewed and stable  Complications: No apparent anesthesia complications

## 2014-09-06 NOTE — Progress Notes (Signed)
  Echocardiogram Echocardiogram Transesophageal has been performed.  Donata Clay 09/06/2014, 8:26 AM

## 2014-09-06 NOTE — Procedures (Signed)
Extubation Procedure Note  Patient Details:   Name: TYCHO CHERAMIE DOB: August 13, 1946 MRN: [id number]   Airway Documentation:  Airway (Active)  Measured From Lips 09/06/2014  2:00 PM  Secured Location Right 09/06/2014  2:00 PM  Secured By Pink Tape 09/06/2014  2:00 PM  Site Condition Dry 09/06/2014  2:00 PM    Evaluation  O2 sats: stable throughout Complications: No apparent complications Patient did tolerate procedure well. Bilateral Breath Sounds: Clear Suctioning: Airway Yes  Patient extubated to 2L nasal cannula.  Positive cuff leak noted.  No evidence of stridor.  Patient able to speak post extubation.  Incentive spirometry performed x4 with achieved goal of 1000.  Sats currently 100%.  Vitals are stable.  Philomena Doheny 09/06/2014, 6:39 PM

## 2014-09-06 NOTE — Anesthesia Procedure Notes (Signed)
Procedure Name: Intubation Date/Time: 09/06/2014 7:53 AM Performed by: Izora Gala Pre-anesthesia Checklist: Patient identified, Emergency Drugs available, Suction available, Patient being monitored and Timeout performed Patient Re-evaluated:Patient Re-evaluated prior to inductionOxygen Delivery Method: Circle system utilized Preoxygenation: Pre-oxygenation with 100% oxygen Intubation Type: IV induction Ventilation: Mask ventilation without difficulty Laryngoscope Size: Miller and 3 Grade View: Grade II Tube type: Oral Number of attempts: 1 Airway Equipment and Method: Stylet Placement Confirmation: ETT inserted through vocal cords under direct vision,  positive ETCO2 and breath sounds checked- equal and bilateral Secured at: 23 cm Tube secured with: Tape Dental Injury: Teeth and Oropharynx as per pre-operative assessment

## 2014-09-06 NOTE — Anesthesia Preprocedure Evaluation (Addendum)
Anesthesia Evaluation  Patient identified by MRN, date of birth, ID band Patient awake    Reviewed: Allergy & Precautions, H&P , NPO status , Patient's Chart, lab work & pertinent test results, reviewed documented beta blocker date and time   History of Anesthesia Complications Negative for: history of anesthetic complications  Airway Mallampati: I  TM Distance: >3 FB Neck ROM: Full    Dental  (+) Teeth Intact, Dental Advisory Given   Pulmonary former smoker (quit 1980),  breath sounds clear to auscultation        Cardiovascular hypertension, Pt. on medications and Pt. on home beta blockers + angina + CAD (severe 3v ASCADz) Rhythm:Regular Rate:Normal  12/15 ECHO: EF 60-65%, valves OK   Neuro/Psych CVA (R wealness), Residual Symptoms    GI/Hepatic negative GI ROS, (+)     substance abuse  alcohol use,   Endo/Other  negative endocrine ROS  Renal/GU negative Renal ROS     Musculoskeletal   Abdominal   Peds  Hematology   Anesthesia Other Findings   Reproductive/Obstetrics                           Anesthesia Physical Anesthesia Plan  ASA: III  Anesthesia Plan: General   Post-op Pain Management:    Induction: Intravenous  Airway Management Planned: Oral ETT  Additional Equipment: Arterial line, CVP, PA Cath, 3D TEE and Ultrasound Guidance Line Placement  Intra-op Plan:   Post-operative Plan: Post-operative intubation/ventilation  Informed Consent: I have reviewed the patients History and Physical, chart, labs and discussed the procedure including the risks, benefits and alternatives for the proposed anesthesia with the patient or authorized representative who has indicated his/her understanding and acceptance.   Dental advisory given  Plan Discussed with: CRNA and Surgeon  Anesthesia Plan Comments: (Plan routine monitors, A line, PA cath, GETA with TEE and post op ventilation )         Anesthesia Quick Evaluation

## 2014-09-06 NOTE — Progress Notes (Signed)
CT surgery p.m. Rounds  Status post CABG 4 Patient extubated neurologically intact Stable hemodynamics Complaining of musculoskeletal pain--we'll start IV Toradol to supplement narcotic

## 2014-09-06 NOTE — Plan of Care (Signed)
Problem: Phase II - Intermediate Post-Op Goal: Maintain Hemodynamic Stability Outcome: Progressing Pt on neo

## 2014-09-06 NOTE — Telephone Encounter (Signed)
Thx

## 2014-09-06 NOTE — Brief Op Note (Signed)
09/06/2014  11:25 AM  PATIENT:  [name]  68 y.o. male  PRE-OPERATIVE DIAGNOSIS:  CAD  POST-OPERATIVE DIAGNOSIS:  CAD  PROCEDURE: TRANSESOPHAGEAL ECHOCARDIOGRAM (TEE), MEDIAN STERNOTOMY for CORONARY ARTERY BYPASS GRAFTING (CABG) x 4 (LIMA to LAD, SVG to OM, SVG to RAMUS INTERMEDIATE, SVG to RCA) with EVH from LEFT THIGH and LEFT LOWER LEG  SURGEON:  Surgeon(s) and Role:    * Ivin Poot, MD - Primary  PHYSICIAN ASSISTANT: Lars Pinks PA-C  ANESTHESIA:   general  EBL:  Total I/O In: 1000 [I.V.:1000] Out: 400 [Urine:400]  BLOOD ADMINISTERED:One FFP and One PLTS  DRAINS: Chest tubes placed in the mediastinal and pleural spaces   COUNTS CORRECT:  YES  DICTATION: .Dragon Dictation  PLAN OF CARE: Admit to inpatient   PATIENT DISPOSITION:  ICU - intubated and hemodynamically stable.   Delay start of Pharmacological VTE agent (>24hrs) due to surgical blood loss or risk of bleeding: yes  BASELINE WEIGHT: 90.3 kg

## 2014-09-07 ENCOUNTER — Inpatient Hospital Stay (HOSPITAL_COMMUNITY): Payer: Medicare HMO

## 2014-09-07 LAB — PREPARE PLATELET PHERESIS: Unit division: 0

## 2014-09-07 LAB — GLUCOSE, CAPILLARY
Glucose-Capillary: 101 mg/dL — ABNORMAL HIGH (ref 70–99)
Glucose-Capillary: 108 mg/dL — ABNORMAL HIGH (ref 70–99)
Glucose-Capillary: 119 mg/dL — ABNORMAL HIGH (ref 70–99)
Glucose-Capillary: 129 mg/dL — ABNORMAL HIGH (ref 70–99)
Glucose-Capillary: 139 mg/dL — ABNORMAL HIGH (ref 70–99)
Glucose-Capillary: 99 mg/dL (ref 70–99)

## 2014-09-07 LAB — CBC
HCT: 29.4 % — ABNORMAL LOW (ref 39.0–52.0)
HEMATOCRIT: 28 % — AB (ref 39.0–52.0)
Hemoglobin: 9.5 g/dL — ABNORMAL LOW (ref 13.0–17.0)
Hemoglobin: 9.9 g/dL — ABNORMAL LOW (ref 13.0–17.0)
MCH: 30.9 pg (ref 26.0–34.0)
MCH: 31 pg (ref 26.0–34.0)
MCHC: 33.9 g/dL (ref 30.0–36.0)
MCHC: 33.7 g/dL (ref 30.0–36.0)
MCV: 91.2 fL (ref 78.0–100.0)
MCV: 92.2 fL (ref 78.0–100.0)
PLATELETS: 141 10*3/uL — AB (ref 150–400)
Platelets: 151 10*3/uL (ref 150–400)
RBC: 3.07 MIL/uL — AB (ref 4.22–5.81)
RBC: 3.19 MIL/uL — AB (ref 4.22–5.81)
RDW: 12.4 % (ref 11.5–15.5)
RDW: 12.5 % (ref 11.5–15.5)
WBC: 8.6 10*3/uL (ref 4.0–10.5)
WBC: 9.3 10*3/uL (ref 4.0–10.5)

## 2014-09-07 LAB — BASIC METABOLIC PANEL
Anion gap: 14 (ref 5–15)
BUN: 7 mg/dL (ref 6–23)
CO2: 22 mEq/L (ref 19–32)
Calcium: 7.6 mg/dL — ABNORMAL LOW (ref 8.4–10.5)
Chloride: 104 mEq/L (ref 96–112)
Creatinine, Ser: 0.6 mg/dL (ref 0.50–1.35)
GFR calc Af Amer: >90 mL/min (ref >90)
GFR calc non Af Amer: >90 mL/min (ref >90)
Glucose, Bld: 108 mg/dL — ABNORMAL HIGH (ref 70–99)
Potassium: 3.2 mEq/L — ABNORMAL LOW (ref 3.7–5.3)
Sodium: 140 mEq/L (ref 137–147)

## 2014-09-07 LAB — MAGNESIUM
Magnesium: 2.5 mg/dL (ref 1.5–2.5)
Magnesium: 2.6 mg/dL — ABNORMAL HIGH (ref 1.5–2.5)

## 2014-09-07 LAB — PREPARE FRESH FROZEN PLASMA: Unit division: 0

## 2014-09-07 LAB — POCT I-STAT, CHEM 8
BUN: 7 mg/dL (ref 6–23)
Calcium, Ion: 1.11 mmol/L — ABNORMAL LOW (ref 1.13–1.30)
Chloride: 97 mEq/L (ref 96–112)
Creatinine, Ser: 0.5 mg/dL (ref 0.50–1.35)
Glucose, Bld: 116 mg/dL — ABNORMAL HIGH (ref 70–99)
HCT: 29 % — ABNORMAL LOW (ref 39.0–52.0)
Hemoglobin: 9.9 g/dL — ABNORMAL LOW (ref 13.0–17.0)
Potassium: 3.2 mEq/L — ABNORMAL LOW (ref 3.7–5.3)
Sodium: 133 mEq/L — ABNORMAL LOW (ref 137–147)
TCO2: 23 mmol/L (ref 0–100)

## 2014-09-07 LAB — CREATININE, SERUM
CREATININE: 0.53 mg/dL (ref 0.50–1.35)
GFR calc Af Amer: >90 mL/min (ref >90)
GFR calc non Af Amer: >90 mL/min (ref >90)

## 2014-09-07 MED ORDER — POTASSIUM CHLORIDE 10 MEQ/50ML IV SOLN
10.0000 meq | INTRAVENOUS | Status: AC
  Administered 2014-09-07 (×3): 10 meq via INTRAVENOUS
  Filled 2014-09-07 (×2): qty 50

## 2014-09-07 MED ORDER — FUROSEMIDE 10 MG/ML IJ SOLN
20.0000 mg | Freq: Two times a day (BID) | INTRAMUSCULAR | Status: AC
  Administered 2014-09-07 – 2014-09-08 (×3): 20 mg via INTRAVENOUS
  Filled 2014-09-07 (×5): qty 2

## 2014-09-07 MED ORDER — INSULIN ASPART 100 UNIT/ML ~~LOC~~ SOLN: 0.0000 [IU] | SUBCUTANEOUS | Status: DC

## 2014-09-07 MED ORDER — SODIUM CHLORIDE 0.9 % IJ SOLN
10.0000 mL | INTRAMUSCULAR | Status: DC | PRN
  Administered 2014-09-07: 20 mL
  Filled 2014-09-07: qty 40

## 2014-09-07 MED ORDER — CLOPIDOGREL BISULFATE 75 MG PO TABS
75.0000 mg | ORAL_TABLET | Freq: Every day | ORAL | Status: DC
  Administered 2014-09-10 – 2014-09-11 (×2): 75 mg via ORAL
  Filled 2014-09-07 (×4): qty 1

## 2014-09-07 MED ORDER — SODIUM CHLORIDE 0.9 % IJ SOLN
10.0000 mL | Freq: Two times a day (BID) | INTRAMUSCULAR | Status: DC
  Administered 2014-09-07 – 2014-09-08 (×2): 10 mL

## 2014-09-07 NOTE — Progress Notes (Signed)
1 Day Post-Op Procedure(s) (LRB): CORONARY ARTERY BYPASS GRAFTING (CABG) (N/A) TRANSESOPHAGEAL ECHOCARDIOGRAM (TEE) (N/A) Subjective: A-pacing Neo weaned off Mod CT drainage- leave tubes this am CBGs well controlled  Objective: Vital signs in last 24 hours: Temp:  [97 F (36.1 C)-99.9 F (37.7 C)] 98.6 F (37 C) (12/12 0800) Pulse Rate:  [79-113] 90 (12/12 0800) Cardiac Rhythm:  [-] Atrial paced (12/12 0800) Resp:  [0-27] 16 (12/12 0800) BP: (86-120)/(55-71) 104/64 mmHg (12/12 0800) SpO2:  [98 %-100 %] 100 % (12/12 0800) Arterial Line BP: (83-144)/(41-79) 123/62 mmHg (12/12 0800) FiO2 (%):  [40 %-50 %] 40 % (12/11 1732) Weight:  [199 lb 15.3 oz (90.7 kg)] 199 lb 15.3 oz (90.7 kg) (12/12 0600)  Hemodynamic parameters for last 24 hours: PAP: (22-46)/(6-32) 34/16 mmHg CO:  [5.6 L/min-7.6 L/min] 7.6 L/min CI:  [2.6 L/min/m2-3.6 L/min/m2] 3.6 L/min/m2  Intake/Output from previous day: 12/11 0701 - 12/12 0700 In: 4971.3 [I.V.:2018.3; Blood:1073; NG/GT:30; IV Piggyback:1850] Out: 4580 [Urine:2630; Blood:1000; Chest Tube:740] Intake/Output this shift: Total I/O In: 81.7 [I.V.:81.7] Out: 95 [Urine:85; Chest Tube:10]  Neuro intact  Lab Results:  Recent Labs  09/06/14 2000 09/06/14 2026 09/07/14 0439  WBC 8.0  --  8.6  HGB 10.7* 10.5* 9.5*  HCT 32.1* 31.0* 28.0*  PLT 154  --  151   BMET:  Recent Labs  09/05/14 1515  09/06/14 2026 09/07/14 0439  NA 135*  < > 137 140  K 3.3*  < > 3.2* 3.2*  CL 97  < > 103 104  CO2 19  --   --  22  GLUCOSE 91  < > 138* 108*  BUN 13  < > 7 7  CREATININE 0.55  < > 0.60 0.60  CALCIUM 9.3  --   --  7.6*  < > = values in this interval not displayed.  PT/INR:  Recent Labs  09/06/14 1415  LABPROT 20.1*  INR 1.69*   ABG    Component Value Date/Time   PHART 7.353 09/06/2014 1910   HCO3 21.9 09/06/2014 1910   TCO2 19 09/06/2014 2026   ACIDBASEDEF 3.0* 09/06/2014 1910   O2SAT 98.0 09/06/2014 1910   CBG (last 3)   Recent  Labs  09/06/14 1741 09/06/14 1902 09/07/14 0004  GLUCAP 119* 129* 129*    Assessment/Plan: S/P Procedure(s) (LRB): CORONARY ARTERY BYPASS GRAFTING (CABG) (N/A) TRANSESOPHAGEAL ECHOCARDIOGRAM (TEE) (N/A) See progression orders Cont toradol today  LOS: 1 day    VAN TRIGT III,Caprisha Bridgett 09/07/2014

## 2014-09-07 NOTE — Progress Notes (Signed)
CT surgery p.m. Rounds  Patient out of bed to chair Maintaining sinus rhythm Chest tube drainage decreasing Urine output also decreasing will dose Lasix 20 mg IV twice a day

## 2014-09-07 NOTE — Op Note (Signed)
NAMEMarland Kitchen  [name], [name] NO.:  [id number]  MEDICAL RECORD NO.:  [id number]  LOCATION:  2S12C                        FACILITY:  [facility]  PHYSICIAN:  Ivin Poot, M.D.  DATE OF BIRTH:  Dec 06, 1945  DATE OF PROCEDURE:  09/06/2014 DATE OF DISCHARGE:                              OPERATIVE REPORT   OPERATION: 1. Coronary artery bypass grafting x4 (left internal mammary artery to     LAD, saphenous vein graft to ramus intermediate, saphenous vein     graft to obtuse marginal 1, saphenous vein graft to right coronary     artery). 2. Endoscopic harvest of the left leg greater saphenous vein, exposure     but not harvest of saphenous vein from the right leg.  SURGEON:  Ivin Poot, MD  ASSISTANT:  Lars Pinks, PA  PREOPERATIVE DIAGNOSES:  Severe three-vessel coronary artery disease, history of stroke, preserved left ventricular function.  POSTOPERATIVE DIAGNOSES:  Severe three-vessel coronary artery disease, history of stroke, preserved left ventricular function.  INDICATIONS:  The patient is a 68 year old hypertensive male with prior history of stroke on chronic Plavix therapy who presents with exertional chest pain and positive stress test.  Subsequent cardiac catheterization demonstrated severe three-vessel coronary artery disease with chronic occlusion of the circumflex, high-grade 95% stenosis of the RCA and LAD, and 80% stenosis of the ramus intermediate.  He was felt to be candidate for surgical coronary revascularization.  His preoperative echocardiogram did not demonstrate any significant valvular disease.  Prior to surgery, I examined the patient in the outpatient cardiac catheterization holding area.  I reviewed the results of the catheterizations with the patient and his wife.  I discussed the indications and expected benefits of coronary artery bypass grafting for treatment of his severe coronary artery disease.  I reviewed the major aspects  of the procedure including the use of general anesthesia and cardiopulmonary bypass, the location of the surgical incisions, and the expected postoperative hospital recovery.  I discussed with the patient the risks to him of coronary artery bypass surgery including risks of stroke, MI, bleeding, blood transfusion requirement, postop pleural effusion or pulmonary problems, postoperative arrhythmias, or death.  He demonstrated his understanding of these issues and agreed to proceed with surgery under what I felt was an informed consent.  OPERATIVE FINDINGS: 1. Adequate conduit from the left leg.  Right leg vein looked     suboptimal. 2. No red blood cell transfusion required for this surgery. 3. Small targets in the ramus and the circumflex distribution,     intramural location of the LAD except at its distal third where the     anastomosis was placed.  PROCEDURE:  The patient was brought to the operating room and placed supine on the operating room table where general anesthesia was induced under invasive hemodynamic monitoring.  The chest, abdomen, and legs were prepped with Betadine and draped as a sterile field.  A sternal incision was made as the saphenous vein was harvested endoscopically from the left leg.  The left internal mammary artery was harvested as a pedicle graft from its origin at the subclavian vessels.  It was a small vessel but had excellent flow.  The sternal retractor was placed and the pericardium opened and suspended.  Pursestrings were placed in the ascending aorta and right atrium, and after the vein was harvested, the patient was heparinized. When the ACT was documented as being therapeutic, the patient was cannulated and placed on cardiopulmonary bypass.  The coronary arteries were identified for grafting.  The mammary artery and vein grafts were prepared for the distal anastomoses.  Cardioplegia cannulas were then placed both antegrade and retrograde cold  blood cardioplegia.  The patient was cooled to 32 degrees.  Aortic crossclamp was applied.  An 800 mL of cold blood cardioplegia was delivered in split doses between the antegrade aortic and retrograde coronary sinus catheters.  There was good cardioplegic arrest and septal temperature dropped less than 14 degrees.  Cardioplegia was delivered every 20 minutes or less while the crossclamp was in place.  The distal coronary anastomoses were performed.  The first distal anastomosis was the RCA before the bifurcation.  It was a 1.5-mm vessel with proximal 95% stenosis.  A reverse saphenous vein was sewn end-to- side with running 7-0 Prolene with excellent flow through the graft. Cardioplegia was redosed.  The second distal anastomosis was the OM branch of the left coronary. This was chronically occluded.  It was a 1.5-mm vessel.  A reverse saphenous vein was sewn end-to-side with running 7-0 Prolene with good flow through the graft.  Cardioplegia was redosed.  A third distal anastomosis was the ramus intermediate branch of the left coronary system.  It was a 1.5-mm vessel with proximal 90% stenosis.  A reverse saphenous vein was sewn end-to-side with running 7-0 Prolene with good flow through the graft.  Cardioplegia was redosed.  The fourth distal anastomosis was the distal third of the LAD where it became epicardial in location.  The left IMA was brought through an opening, and the left lateral pericardium was brought down onto the LAD and sewn end-to-side with running 8-0 Prolene.  The bulldog on the mammary pedicle was briefly opened, and there was good perfusion of blood through the vessel into the LAD.  The bulldog was reapplied and the pedicle was secured to the epicardium.  Cardioplegia was redosed.  The patient was rewarmed and reperfused.  While the crossclamp was still in place, 3 proximal vein anastomoses were performed on the ascending aorta using a 4.0-mm punch running 6-0  Prolene.  Prior to tying down the final proximal anastomosis, air was vented from the coronaries with a dose of retrograde warm blood cardioplegia.  The crossclamp was then removed.  The heart resumed a spontaneous rhythm.  The patient was rewarmed and reperfused.  The proximal and distal anastomoses were checked and found to be hemostatic.  Temporary pacing wires were applied.  When the patient was adequately rewarmed and reperfused, he was weaned off cardiopulmonary bypass without difficulty.  Transesophageal echo showed excellent LV global function.  Protamine was administered without adverse reaction.  The cannulas were removed.  The mediastinum was irrigated with warm saline.  Hemostasis was suboptimal.  The patient was given platelets and FFP with improvement in coagulopathy.  The superior pericardial fat was closed over the aorta.  Anterior mediastinal and left pleural chest tubes were placed and brought out through separate incisions. The sternum was closed with interrupted steel wire.  The pectoralis fascia was closed with a running #1 Vicryl.  The subcutaneous and skin layers were closed with running Vicryl and sterile dressings were applied.  Total cardiopulmonary bypass time was 101 minutes.  Ivin Poot, M.D.     PV/MEDQ  D:  09/06/2014  T:  09/06/2014  Job:  [id number]

## 2014-09-08 ENCOUNTER — Inpatient Hospital Stay (HOSPITAL_COMMUNITY): Payer: Medicare HMO

## 2014-09-08 LAB — GLUCOSE, CAPILLARY
Glucose-Capillary: 121 mg/dL — ABNORMAL HIGH (ref 70–99)
Glucose-Capillary: 93 mg/dL (ref 70–99)
Glucose-Capillary: 97 mg/dL (ref 70–99)
Glucose-Capillary: 124 mg/dL — ABNORMAL HIGH (ref 70–99)

## 2014-09-08 LAB — CBC
HCT: 29 % — ABNORMAL LOW (ref 39.0–52.0)
Hemoglobin: 9.7 g/dL — ABNORMAL LOW (ref 13.0–17.0)
MCH: 31 pg (ref 26.0–34.0)
MCHC: 33.4 g/dL (ref 30.0–36.0)
MCV: 92.7 fL (ref 78.0–100.0)
Platelets: 131 10*3/uL — ABNORMAL LOW (ref 150–400)
RBC: 3.13 MIL/uL — ABNORMAL LOW (ref 4.22–5.81)
RDW: 12.6 % (ref 11.5–15.5)
WBC: 8.4 10*3/uL (ref 4.0–10.5)

## 2014-09-08 LAB — BASIC METABOLIC PANEL
Anion gap: 12 (ref 5–15)
BUN: 10 mg/dL (ref 6–23)
CO2: 26 mEq/L (ref 19–32)
Calcium: 7.7 mg/dL — ABNORMAL LOW (ref 8.4–10.5)
Chloride: 97 mEq/L (ref 96–112)
Creatinine, Ser: 0.54 mg/dL (ref 0.50–1.35)
GFR calc Af Amer: >90 mL/min (ref >90)
GFR calc non Af Amer: >90 mL/min (ref >90)
Glucose, Bld: 88 mg/dL (ref 70–99)
Potassium: 3.4 mEq/L — ABNORMAL LOW (ref 3.7–5.3)
Sodium: 135 mEq/L — ABNORMAL LOW (ref 137–147)

## 2014-09-08 MED ORDER — POTASSIUM CHLORIDE CRYS ER 20 MEQ PO TBCR
20.0000 meq | EXTENDED_RELEASE_TABLET | Freq: Every day | ORAL | Status: DC
  Filled 2014-09-08: qty 1

## 2014-09-08 MED ORDER — POTASSIUM CHLORIDE 10 MEQ/50ML IV SOLN
10.0000 meq | INTRAVENOUS | Status: AC
  Administered 2014-09-08 (×3): 10 meq via INTRAVENOUS

## 2014-09-08 MED ORDER — METOCLOPRAMIDE HCL 5 MG/ML IJ SOLN
10.0000 mg | Freq: Four times a day (QID) | INTRAMUSCULAR | Status: DC
  Administered 2014-09-08 – 2014-09-09 (×4): 10 mg via INTRAVENOUS
  Filled 2014-09-08 (×12): qty 2

## 2014-09-08 MED ORDER — ALPRAZOLAM 0.5 MG PO TABS: 0.5000 mg | ORAL_TABLET | Freq: Every evening | ORAL | Status: DC | PRN

## 2014-09-08 MED ORDER — SODIUM CHLORIDE 0.9 % IV SOLN: 250.0000 mL | INTRAVENOUS | Status: DC | PRN

## 2014-09-08 MED ORDER — MAGNESIUM HYDROXIDE 400 MG/5ML PO SUSP: 30.0000 mL | Freq: Every day | ORAL | Status: DC | PRN

## 2014-09-08 MED ORDER — MOVING RIGHT ALONG BOOK
Freq: Once | Status: AC
  Administered 2014-09-08: 1
  Filled 2014-09-08: qty 1

## 2014-09-08 MED ORDER — SODIUM CHLORIDE 0.9 % IJ SOLN
3.0000 mL | Freq: Two times a day (BID) | INTRAMUSCULAR | Status: DC
  Administered 2014-09-08 – 2014-09-14 (×7): 3 mL via INTRAVENOUS

## 2014-09-08 MED ORDER — FUROSEMIDE 40 MG PO TABS
40.0000 mg | ORAL_TABLET | Freq: Every day | ORAL | Status: DC
  Administered 2014-09-10: 40 mg via ORAL
  Filled 2014-09-08 (×3): qty 1

## 2014-09-08 MED ORDER — SODIUM CHLORIDE 0.9 % IJ SOLN
3.0000 mL | INTRAMUSCULAR | Status: DC | PRN
  Administered 2014-09-15: 3 mL via INTRAVENOUS
  Filled 2014-09-08: qty 3

## 2014-09-08 MED ORDER — PROMETHAZINE HCL 25 MG/ML IJ SOLN
12.5000 mg | INTRAMUSCULAR | Status: DC | PRN
  Administered 2014-09-08: 12.5 mg via INTRAVENOUS
  Filled 2014-09-08 (×2): qty 1

## 2014-09-08 MED ORDER — POTASSIUM CHLORIDE 10 MEQ/50ML IV SOLN
INTRAVENOUS | Status: AC
  Filled 2014-09-08: qty 150

## 2014-09-08 NOTE — Clinical Documentation Improvement (Signed)
  Query #1 K+ levels this admission - 2.8;  3.2;  3.2;  3.2;  3.4.  Multiple doses of KCL have been administered.  Possible Clinical Conditions:   - Hypokalemia  - Other Condition  - Unable to Clinically Determine  Query #2 Patient on Lipitor prior to admission.  Continues on Lipitor since admission.  Possible Clinical Conditions:  - Hyperlipidemia  - Other Condition  - Unable to Clinically Determine   Thank You, Erling Conte ,RN Clinical Documentation Specialist:  [phone] [employer] [place] [employer]

## 2014-09-08 NOTE — Progress Notes (Signed)
Patient has need continuous education in regards to the inability to pull and use arms for movement. He continues to be impulsive and try and move in and out of bed without the care of staff. RN has had multiple talks with him about the importance of properly mobilizing without causing harm to oneself. Patient now states that he understands the importance, but RN will continue to educate as neccesary. Richardean Sale, RN

## 2014-09-08 NOTE — Progress Notes (Signed)
2 Days Post-Op Procedure(s) (LRB): CORONARY ARTERY BYPASS GRAFTING (CABG) (N/A) TRANSESOPHAGEAL ECHOCARDIOGRAM (TEE) (N/A) Subjective: Nausea nsr CXR clear  Objective: Vital signs in last 24 hours: Temp:  [97.8 F (36.6 C)-99.1 F (37.3 C)] 98.2 F (36.8 C) (12/13 0700) Pulse Rate:  [71-90] 81 (12/13 0700) Cardiac Rhythm:  [-] Normal sinus rhythm (12/13 0800) Resp:  [12-24] 16 (12/13 0700) BP: (90-164)/(57-74) 109/59 mmHg (12/13 0700) SpO2:  [89 %-97 %] 91 % (12/13 0700) Weight:  [192 lb 11.2 oz (87.408 kg)] 192 lb 11.2 oz (87.408 kg) (12/13 0500)  Hemodynamic parameters for last 24 hours:  stable  Intake/Output from previous day: 12/12 0701 - 12/13 0700 In: 1594.2 [P.O.:630; I.V.:514.2; IV Piggyback:450] Out: 1560 [Urine:1070; Chest Tube:490] Intake/Output this shift: Total I/O In: 180 [I.V.:60; IV Piggyback:120] Out: 375 [Urine:375]  Lungs clear  Lab Results:  Recent Labs  09/07/14 1743 09/07/14 1747 09/08/14 0425  WBC 9.3  --  8.4  HGB 9.9* 9.9* 9.7*  HCT 29.4* 29.0* 29.0*  PLT 141*  --  131*   BMET:  Recent Labs  09/07/14 0439  09/07/14 1747 09/08/14 0425  NA 140  --  133* 135*  K 3.2*  --  3.2* 3.4*  CL 104  --  97 97  CO2 22  --   --  26  GLUCOSE 108*  --  116* 88  BUN 7  --  7 10  CREATININE 0.60  < > 0.50 0.54  CALCIUM 7.6*  --   --  7.7*  < > = values in this interval not displayed.  PT/INR:  Recent Labs  09/06/14 1415  LABPROT 20.1*  INR 1.69*   ABG    Component Value Date/Time   PHART 7.353 09/06/2014 1910   HCO3 21.9 09/06/2014 1910   TCO2 23 09/07/2014 1747   ACIDBASEDEF 3.0* 09/06/2014 1910   O2SAT 98.0 09/06/2014 1910   CBG (last 3)   Recent Labs  09/07/14 2006 09/08/14 0031 09/08/14 0410  GLUCAP 139* 121* 93    Assessment/Plan: S/P Procedure(s) (LRB): CORONARY ARTERY BYPASS GRAFTING (CABG) (N/A) TRANSESOPHAGEAL ECHOCARDIOGRAM (TEE) (N/A) Plan for transfer to step-down: see transfer orders Stop SSI  LOS: 2  days    VAN TRIGT III,PETER 09/08/2014

## 2014-09-08 NOTE — Progress Notes (Signed)
Patient has refused all of oral medications. He has nausea throughout the majority of the day with intermittent relief. He has only taken in very little liquids and has had 3 periods of emesis, very small and clear in nature. MD ordered Reglan to help, BS+x4 and patient has now had 3 stools. He has refused evening dose of Reglan. Wife informed of transfer and RN for 2W25 called report. Will transfer via wheelchair. Richardean Sale, RN

## 2014-09-09 ENCOUNTER — Inpatient Hospital Stay (HOSPITAL_COMMUNITY): Payer: Medicare HMO

## 2014-09-09 ENCOUNTER — Encounter (HOSPITAL_COMMUNITY): Payer: Self-pay | Admitting: Cardiothoracic Surgery

## 2014-09-09 LAB — CBC
HCT: 33.6 % — ABNORMAL LOW (ref 39.0–52.0)
Hemoglobin: 11 g/dL — ABNORMAL LOW (ref 13.0–17.0)
MCH: 30.4 pg (ref 26.0–34.0)
MCHC: 32.7 g/dL (ref 30.0–36.0)
MCV: 92.8 fL (ref 78.0–100.0)
Platelets: 160 10*3/uL (ref 150–400)
RBC: 3.62 MIL/uL — ABNORMAL LOW (ref 4.22–5.81)
RDW: 12.3 % (ref 11.5–15.5)
WBC: 10.3 10*3/uL (ref 4.0–10.5)

## 2014-09-09 LAB — BASIC METABOLIC PANEL
Anion gap: 18 — ABNORMAL HIGH (ref 5–15)
BUN: 9 mg/dL (ref 6–23)
CO2: 21 mEq/L (ref 19–32)
Calcium: 8.2 mg/dL — ABNORMAL LOW (ref 8.4–10.5)
Chloride: 94 mEq/L — ABNORMAL LOW (ref 96–112)
Creatinine, Ser: 0.55 mg/dL (ref 0.50–1.35)
GFR calc Af Amer: >90 mL/min (ref >90)
GFR calc non Af Amer: >90 mL/min (ref >90)
Glucose, Bld: 128 mg/dL — ABNORMAL HIGH (ref 70–99)
Potassium: 3.1 mEq/L — ABNORMAL LOW (ref 3.7–5.3)
Sodium: 133 mEq/L — ABNORMAL LOW (ref 137–147)

## 2014-09-09 LAB — GLUCOSE, CAPILLARY: Glucose-Capillary: 129 mg/dL — ABNORMAL HIGH (ref 70–99)

## 2014-09-09 LAB — POCT I-STAT GLUCOSE
Glucose, Bld: 88 mg/dL (ref 70–99)
Operator id: 173792

## 2014-09-09 MED ORDER — METOPROLOL TARTRATE 25 MG/10 ML ORAL SUSPENSION
25.0000 mg | Freq: Two times a day (BID) | ORAL | Status: DC
  Filled 2014-09-09 (×4): qty 10

## 2014-09-09 MED ORDER — POTASSIUM CHLORIDE CRYS ER 20 MEQ PO TBCR
40.0000 meq | EXTENDED_RELEASE_TABLET | Freq: Once | ORAL | Status: AC
  Administered 2014-09-09: 40 meq via ORAL
  Filled 2014-09-09: qty 2

## 2014-09-09 MED ORDER — LORAZEPAM 2 MG/ML IJ SOLN
1.0000 mg | Freq: Three times a day (TID) | INTRAMUSCULAR | Status: DC | PRN
  Administered 2014-09-09 – 2014-09-12 (×4): 1 mg via INTRAVENOUS
  Filled 2014-09-09 (×4): qty 1

## 2014-09-09 MED ORDER — METOPROLOL TARTRATE 25 MG PO TABS
25.0000 mg | ORAL_TABLET | Freq: Two times a day (BID) | ORAL | Status: DC
  Administered 2014-09-09: 25 mg via ORAL
  Filled 2014-09-09 (×4): qty 1

## 2014-09-09 MED FILL — Lidocaine HCl IV Inj 20 MG/ML: INTRAVENOUS | Qty: 5 | Status: AC

## 2014-09-09 MED FILL — Heparin Sodium (Porcine) Inj 1000 Unit/ML: INTRAMUSCULAR | Qty: 10 | Status: AC

## 2014-09-09 MED FILL — Sodium Bicarbonate IV Soln 8.4%: INTRAVENOUS | Qty: 50 | Status: AC

## 2014-09-09 MED FILL — Electrolyte-R (PH 7.4) Solution: INTRAVENOUS | Qty: 3000 | Status: AC

## 2014-09-09 MED FILL — Heparin Sodium (Porcine) Inj 1000 Unit/ML: INTRAMUSCULAR | Qty: 30 | Status: AC

## 2014-09-09 MED FILL — Sodium Chloride IV Soln 0.9%: INTRAVENOUS | Qty: 2000 | Status: AC

## 2014-09-09 MED FILL — Potassium Chloride Inj 2 mEq/ML: INTRAVENOUS | Qty: 40 | Status: AC

## 2014-09-09 MED FILL — Mannitol IV Soln 20%: INTRAVENOUS | Qty: 500 | Status: AC

## 2014-09-09 MED FILL — Magnesium Sulfate Inj 50%: INTRAMUSCULAR | Qty: 10 | Status: AC

## 2014-09-09 NOTE — Progress Notes (Signed)
Rn asked pt to walk and pt declined at this time. Pt encouraged to walk before bed tonight. Pt verbalized understanding. Will continue to monitor pt closely.

## 2014-09-09 NOTE — Progress Notes (Addendum)
      [address]       [place] [address]             [phone]        3 Days Post-Op Procedure(s) (LRB): CORONARY ARTERY BYPASS GRAFTING (CABG) (N/A) TRANSESOPHAGEAL ECHOCARDIOGRAM (TEE) (N/A)  Subjective: Patient feeling a little less nausea this am. He is going to try and eat breakfast.  Objective: Vital signs in last 24 hours: Temp:  [98.2 F (36.8 C)-100.9 F (38.3 C)] 98.2 F (36.8 C) (12/14 0500) Pulse Rate:  [75-102] 102 (12/14 0500) Cardiac Rhythm:  [-] Normal sinus rhythm (12/13 2030) Resp:  [17-24] 20 (12/14 0500) BP: (108-142)/(61-89) 142/75 mmHg (12/14 0500) SpO2:  [90 %-97 %] 91 % (12/14 0500) Weight:  [190 lb 7.6 oz (86.4 kg)] 190 lb 7.6 oz (86.4 kg) (12/14 0500)  Pre op weight 90. 3kg Current Weight  09/09/14 190 lb 7.6 oz (86.4 kg)       Intake/Output from previous day: 12/13 0701 - 12/14 0700 In: 200 [I.V.:80; IV Piggyback:120] Out: 1232 [Urine:1230; Emesis/NG output:2]   Physical Exam:  Cardiovascular: RRR Pulmonary: Slightly diminished at bases; no rales, wheezes, or rhonchi. Abdomen: Soft, non tender, bowel sounds present. Extremities: Trace bilateral lower extremity edema. Wounds: LLE wounds are clean and dry.  No erythema or signs of infection. Aquacel dressing on sternum  Lab Results: CBC: Recent Labs  09/08/14 0425 09/09/14 0501  WBC 8.4 10.3  HGB 9.7* 11.0*  HCT 29.0* 33.6*  PLT 131* 160   BMET:  Recent Labs  09/08/14 0425 09/09/14 0501  NA 135* 133*  K 3.4* 3.1*  CL 97 94*  CO2 26 21  GLUCOSE 88 128*  BUN 10 9  CREATININE 0.54 0.55  CALCIUM 7.7* 8.2*    PT/INR:  Lab Results  Component Value Date   INR 1.69* 09/06/2014   INR 1.20 09/05/2014   INR 1.1* 08/28/2014   ABG:  INR: Will add last result for INR, ABG once components are confirmed Will add last 4 CBG results once components are confirmed  Assessment/Plan:  1. CV - ST in the 110's. On Lopressor 12.5 bid and Plavix 75 daily.  Will increase Lopressor to 25 bid. 2.  Pulmonary - CXR shows no pneumothorax, small bilateral pleural effusions and atelectasis.Encourage incentive spirometer. 4.  Acute blood loss anemia - H and H up to 11 and 33.6 5. GI-persistent nausea ans several episodes of emesis. Refused Reglan yesterday but took this am, Phenergan made him "loopy". He is requesting stool softeners be stopped. 6. Hypokalemia-potasium 3.1. Supplement 7. Mild hyponatremia-sodium 133 8. Thrombocytopenia resolved-platelets up to 160,000 9. Stop diuresis as is below weight and not tolerating oral 10. Remove EPW in am  ZIMMERMAN,DONIELLE MPA-C 09/09/2014,8:08 AM  patient examined and medical record reviewed,agree with above note. VAN TRIGT III,Quinci Gavidia 09/09/2014

## 2014-09-09 NOTE — Progress Notes (Signed)
09/09/2014 09:15 PM The patient was given 4 mg Zofran IV for his nausea but it did not help.  The patient was anxious but did not wish to take the Xanax ordered because it was by mouth and he was worried he would throw it up. I offered to get the patient an IV anti-anxiety medication, such as ativan.  Dr. Roxan Hockey was paged and informed the patient wanted to take IV ativan to help him sleep. Dr. Roxan Hockey said he would put in the order for ativan.  Will continue to monitor the patient. Lupita Dawn

## 2014-09-09 NOTE — Discharge Summary (Signed)
Physician Discharge Summary       [address]       [place] [address]             [phone]    Patient ID: [name] MRN: [id number] DOB/AGE: Jun 14, 1946 68 y.o.  Admit date: 09/06/2014 Discharge date: 09/15/2014  Admission Diagnoses: 1. Multivessel CAD 2. History of hypertension 3. History of hyperlipidemia 4. History of remote tobacco abuse 5. History of CVA 6. History of BPH  Discharge Diagnoses:  1. Multivessel CAD 2. History of hypertension 3. History of hyperlipidemia 4. History of remote tobacco abuse 5. History of CVA 6. History of BPH 7. Atrial fibrillation with RVR (converted to sinus rhythm) 8. Post op nausea (resolved prior to discharge) Procedure (s):  1.Coronary artery bypass grafting x4 (left internal mammary artery to LAD, saphenous vein graft to ramus intermediate, saphenous vein graft to obtuse marginal 1, saphenous vein graft to right coronary artery). 2.Endoscopic harvest of the left leg greater saphenous vein, exposure but not harvest of saphenous vein from the right leg by Dr. Prescott Gum on 09/06/2014.  History of Presenting Illness: This is a 68 year old Caucasian male with a past medical history of  HTN, dyslipidemia, remote tobacco abuse, and posterior circulation CVA 4 yrs ago who was found to have severe three vessel coronary artery disease.Surgical coronary revascularization has been recommended by his cardiologist. The patient has had class III angina the past few months, which has increased in severity.A stress test was positive so he underwent catheterization on 08/29/2014. Results showed LV function to be normal. He has high grade stenosis of the LAD, RCA, Ramus and total circumflex occlusion. He was initially seen in consultation in the the hospital on 08/29/2014. He was discharged in stable conditin and returned to have his heart surgery.  Echocardiogram done on 09/04/2014 showed LVEF 60-65%,  No AS or AI, no MS and  trivial MR, and no pericardial effusion. Duplex carotid US showed no significant internal carotid artery stenosis bilaterally. Dr. Prescott Gum discussed the need for coronary artery bypass grafting surgery. Potential risks, complications, and benefits of the surgery were discussed with the patient and he agreed to proceed with surgery. He underwent a CABG x 4 on 09/06/2014.  Brief Hospital Course:  The patient was extubated the evening of surgery without difficulty. He remained afebrile and hemodynamically stable. He was initially AAI paced and on Neo synephrine drip. He was given several doses of Toradol for pain. Gordy Councilman, a line, chest tubes, and foley were removed early in the post operative course. Lopressor was started and titrated accordingly. He was volume over loaded and diuresed. He did have thrombocytopenia, which eventually resolved. He was weaned off the insulin drip. The patient's HGA1C pre op was 5.7. He did have nausea and emesis post operatively. He was given Phenergan, Zofran, and Reglan. Phenergan was stopped as it made him "loopy". Oxy was stopped as he did not want it. He had several bowel movements.  Nausea eventually improved. The patient was felt surgically stable for transfer from the ICU to [facility] for further convalescence on 09/08/2013. He continues to progress with cardiac rehab. He was ambulating on room air. He has been tolerating a diet and has had a bowel movement. He went into a fib with RVR the emorning of 12/15. He was given Lopressor 5 mg IV, Digoxin 0.25 mg IV, and placed on a Cardizem drip. His rhythm was mostly PAF. Cardiology was consulted. Lopressor was increased  to 50 bid. Plavix was stopped and he was placed on Coumadin. Currently, he is maintaining sinus rhythm on Lopressor 37.5 bid, Cardizem CD 180 daily, and Coumadin. His INR this am was 1.18. His Coumadin was increased to 5 daily. Epicardial pacing wires and chest tube sutures will be removed prior to discharge. He is  felt surgically stable for discharge today.   Latest Vital Signs: Blood pressure 120/86, pulse 101, temperature 97.9 F (36.6 C), temperature source Oral, resp. rate 18, height 6\' 1"  (1.854 m), weight 186 lb 1.1 oz (84.4 kg), SpO2 98 %.  Physical Exam: Cardiovascular: RRR Pulmonary: Slightly diminished at bases; no rales, wheezes, or rhonchi. Abdomen: Soft, non tender, bowel sounds present. Extremities: Trace bilateral lower extremity edema. Wounds: LLE wounds are clean and dry. No erythema or signs of infection. Aquacel dressing on sternum  Discharge Condition:Stable  Recent laboratory studies:  Lab Results  Component Value Date   WBC 10.3 09/09/2014   HGB 11.0* 09/09/2014   HCT 33.6* 09/09/2014   MCV 92.8 09/09/2014   PLT 160 09/09/2014   Lab Results  Component Value Date   NA 137 09/14/2014   K 4.1 09/14/2014   CL 102 09/14/2014   CO2 24 09/14/2014   CREATININE 0.69 09/14/2014   GLUCOSE 98 09/14/2014   Diagnostic Studies: Dg Chest 2 View  09/09/2014   CLINICAL DATA:  Post CABG.  EXAM: CHEST  2 VIEW  COMPARISON:  09/08/2014  FINDINGS: The right jugular central venous catheter has been removed. Median sternotomy wires with post CABG changes. Epicardial pacer wires are present. Small bilateral pleural effusions with basilar atelectasis. Negative for a pneumothorax. Heart size is mildly enlarged.  IMPRESSION: Bibasilar chest densities are most compatible with small pleural effusions and atelectasis.   Electronically Signed   By: Markus Daft M.D.   On: 09/09/2014 07:53  Discharge Instructions    Amb Referral to Cardiac Rehabilitation    Complete by:  As directed           Discharge Medications:   Medication List    STOP taking these medications        atenolol-chlorthalidone 50-25 MG per tablet  Commonly known as:  TENORETIC     isosorbide mononitrate 15 mg Tb24 24 hr tablet  Commonly known as:  IMDUR     nitroGLYCERIN 0.4 MG SL tablet  Commonly known as:   NITROSTAT     potassium chloride SA 20 MEQ tablet  Commonly known as:  K-DUR,KLOR-CON      TAKE these medications        acetaminophen 325 MG tablet  Commonly known as:  TYLENOL  Take 2 tablets (650 mg total) by mouth every 4 (four) hours as needed for headache or mild pain.     aspirin 81 MG tablet  Take 81 mg by mouth daily.     atorvastatin 20 MG tablet  Commonly known as:  LIPITOR  Take 1 tablet (20 mg total) by mouth daily.     clopidogrel 75 MG tablet  Commonly known as:  PLAVIX  Take 75 mg by mouth daily.     diltiazem 180 MG 24 hr capsule  Commonly known as:  CARDIZEM CD  Take 1 capsule (180 mg total) by mouth daily.     FLUZONE HIGH-DOSE 0.5 ML Susy  Generic drug:  Influenza Vac Split High-Dose     metoprolol tartrate 25 MG tablet  Commonly known as:  LOPRESSOR  Take 1.5 tablets (37.5 mg total) by mouth  2 (two) times daily.     traMADol 50 MG tablet  Commonly known as:  ULTRAM  Take 1-2 tablets (50-100 mg total) by mouth every 4 (four) hours as needed for moderate pain.     warfarin 5 MG tablet  Commonly known as:  COUMADIN  Take 1 tablet (5 mg total) by mouth daily at 6 PM.        The patient has been discharged on:   1.Beta Blocker:  Yes [  x ]                              No   [   ]                              If No, reason:  2.Ace Inhibitor/ARB: Yes [   ]                                     No  [  x  ]                                     If No, reason: Labile BP  3.Statin:   Yes [  x ]                  No  [   ]                  If No, reason:  4.Ecasa:  Yes  [ x  ]                  No   [   ]                  If No, reason:  Follow Up Appointments: Follow-up Information    Follow up with Erlene Quan, PA-C On 10/03/2014.   Specialty:  Cardiology   Why:  Appointment time is at 8:30 am   Contact information:   [address] [place] [address] [phone]       Follow up with VAN Wilber Oliphant, MD On 10/23/2014.    Specialty:  Cardiothoracic Surgery   Why:  PA/LAT CXR to be taken at [facility] (which is in the same building as Dr. Lucianne Lei Trigt's office) on 10/23/2014 at 12:30 pm;Appointment with Dr. Prescott Gum is at 1:30 pm   Contact information:   [place] [address] [phone]       Follow up with Scarlette Calico, MD.   Specialty:  Internal Medicine   Why:  Call for a follow up regarding further surveillance of HGA1C 5.7   Contact information:   [address] [place] [address] [phone]       Follow up with [facility].   Why:  Office number is [phone]. Call to have PT and INR drawn on Tuesday 09/17/2014   Contact information:   [address] [place] [address]       SignedLars Pinks MPA-C 09/15/2014, 8:38 AM

## 2014-09-09 NOTE — Progress Notes (Signed)
CARDIAC REHAB PHASE I   PRE:  Rate/Rhythm: 108 ST with PVCs    BP: sitting 155/78    SaO2: 96 RA  MODE:  Ambulation: 550 ft   POST:  Rate/Rhythm: 126 ST with PVC    BP: sitting 143/85     SaO2: 96 RA  Pt quick to move, reminded pt of protocols, thinking through process. Did better, more controlled after that. Has chronic limp with right foot drop. Compensates well and did not trip at all. Did well with RW for stability. Does not use assistive device at home. HR elevated with increased PVCs with activity. Rest x1. Increased distance from yesterday. Instructions for the day given. Wife supportive in room. [phone]   [name] [employer] CES, ACSM 09/09/2014 10:03 AM

## 2014-09-09 NOTE — Progress Notes (Signed)
Medicare Important Message given? YES  (If response is "NO", the following Medicare IM given date fields will be blank)  Date Medicare IM given: 09/09/14 Medicare IM given by:  Rachele Lamaster  

## 2014-09-10 LAB — BASIC METABOLIC PANEL
Anion gap: 16 — ABNORMAL HIGH (ref 5–15)
BUN: 11 mg/dL (ref 6–23)
CALCIUM: 8.2 mg/dL — AB (ref 8.4–10.5)
CO2: 21 mEq/L (ref 19–32)
Chloride: 101 mEq/L (ref 96–112)
Creatinine, Ser: 0.47 mg/dL — ABNORMAL LOW (ref 0.50–1.35)
GFR calc Af Amer: >90 mL/min (ref >90)
GFR calc non Af Amer: >90 mL/min (ref >90)
GLUCOSE: 119 mg/dL — AB (ref 70–99)
Potassium: 3.3 mEq/L — ABNORMAL LOW (ref 3.7–5.3)
SODIUM: 138 meq/L (ref 137–147)

## 2014-09-10 MED ORDER — METOPROLOL TARTRATE 25 MG PO TABS
25.0000 mg | ORAL_TABLET | Freq: Two times a day (BID) | ORAL | Status: DC
  Administered 2014-09-10 (×2): 25 mg via ORAL
  Filled 2014-09-10 (×4): qty 1

## 2014-09-10 MED ORDER — DIGOXIN 0.25 MG/ML IJ SOLN
0.2500 mg | Freq: Once | INTRAMUSCULAR | Status: AC
  Administered 2014-09-10: 0.25 mg via INTRAVENOUS
  Filled 2014-09-10: qty 1

## 2014-09-10 MED ORDER — LISINOPRIL 5 MG PO TABS
5.0000 mg | ORAL_TABLET | Freq: Every day | ORAL | Status: DC
  Administered 2014-09-10: 5 mg via ORAL
  Filled 2014-09-10 (×2): qty 1

## 2014-09-10 MED ORDER — POTASSIUM CHLORIDE CRYS ER 20 MEQ PO TBCR
20.0000 meq | EXTENDED_RELEASE_TABLET | Freq: Every day | ORAL | Status: DC
  Administered 2014-09-10: 20 meq via ORAL
  Filled 2014-09-10 (×2): qty 1

## 2014-09-10 MED ORDER — POTASSIUM CHLORIDE CRYS ER 20 MEQ PO TBCR
40.0000 meq | EXTENDED_RELEASE_TABLET | Freq: Once | ORAL | Status: AC
  Administered 2014-09-10: 40 meq via ORAL

## 2014-09-10 MED ORDER — DIGOXIN 125 MCG PO TABS
0.1250 mg | ORAL_TABLET | Freq: Every day | ORAL | Status: DC
  Administered 2014-09-11: 0.125 mg via ORAL
  Filled 2014-09-10 (×2): qty 1

## 2014-09-10 MED ORDER — DILTIAZEM LOAD VIA INFUSION
10.0000 mg | Freq: Once | INTRAVENOUS | Status: AC
  Administered 2014-09-10: 10 mg via INTRAVENOUS
  Filled 2014-09-10: qty 10

## 2014-09-10 MED ORDER — METOPROLOL TARTRATE 25 MG/10 ML ORAL SUSPENSION
25.0000 mg | Freq: Two times a day (BID) | ORAL | Status: DC
  Filled 2014-09-10 (×4): qty 10

## 2014-09-10 MED ORDER — DILTIAZEM HCL 100 MG IV SOLR
8.0000 mg/h | INTRAVENOUS | Status: DC
  Administered 2014-09-10: 200 mg/h via INTRAVENOUS
  Administered 2014-09-10 – 2014-09-12 (×6): 8 mg/h via INTRAVENOUS
  Filled 2014-09-10 (×7): qty 100

## 2014-09-10 MED ORDER — POTASSIUM CHLORIDE CRYS ER 10 MEQ PO TBCR
30.0000 meq | EXTENDED_RELEASE_TABLET | Freq: Once | ORAL | Status: DC
Start: 2014-09-10 — End: 2014-09-11
  Filled 2014-09-10: qty 1

## 2014-09-10 NOTE — Progress Notes (Signed)
Nurse informed me patient went into rapid atrial fibrillation this am. Has been given Lopressor 5 mg IV. After discussion with Dr. Prescott Gum, will give Digoxin 0.25 IV, followed by a 10 mg Cardizem bolus and drip (rate of 8 to start). Want to avoid Amiodarone as with post op nausea. Patient to remain in bed until HR under control. Discussed with patient. Monitor closely.

## 2014-09-10 NOTE — Progress Notes (Signed)
Pt on side of bed for dinner, not motivated to ambulate in hall, HR 90s-low 100s. Will continue to monitor. Sherrie Mustache 6:53 PM

## 2014-09-10 NOTE — Progress Notes (Signed)
Came to visit patient at bedside to offer [employer] services as a benefit of his [employer]. He states that he will have his wife look over the [employer] packet and will contact writer if they are interested in services. Will make inpatient RNCM aware of bedside visit. Marthenia Rolling, [facility] [phone]

## 2014-09-10 NOTE — Progress Notes (Signed)
Utilization review completed.  

## 2014-09-10 NOTE — Progress Notes (Signed)
1400 Talked with pt's RN. Pt is on bedrest at this time. RN will walk with pt when order given to increase activity. We will follow up tomorrow. Graylon Good RN BSN 09/10/2014 2:01 PM

## 2014-09-10 NOTE — Progress Notes (Addendum)
      [address]       [place] [address]             [phone]        4 Days Post-Op Procedure(s) (LRB): CORONARY ARTERY BYPASS GRAFTING (CABG) (N/A) TRANSESOPHAGEAL ECHOCARDIOGRAM (TEE) (N/A)  Subjective: Patient denies nausea this am. Looking forward to breakfast. Slept very well last night-given Ativan.  Objective: Vital signs in last 24 hours: Temp:  [98 F (36.7 C)-98.1 F (36.7 C)] 98.1 F (36.7 C) (12/15 0454) Pulse Rate:  [94-103] 100 (12/15 0454) Cardiac Rhythm:  [-] Normal sinus rhythm;Sinus tachycardia (12/14 1940) Resp:  [18-20] 18 (12/15 0454) BP: (126-155)/(77-88) 137/77 mmHg (12/15 0454) SpO2:  [94 %-98 %] 94 % (12/15 0454) Weight:  [188 lb 3.2 oz (85.367 kg)] 188 lb 3.2 oz (85.367 kg) (12/15 0454)  Pre op weight 90. 3kg Current Weight  09/10/14 188 lb 3.2 oz (85.367 kg)       Intake/Output from previous day: 12/14 0701 - 12/15 0700 In: 120 [P.O.:120] Out: -    Physical Exam:  Cardiovascular: RRR Pulmonary: Slightly diminished at bases; no rales, wheezes, or rhonchi. Abdomen: Soft, non tender, bowel sounds present. Extremities: Trace bilateral lower extremity edema. Wounds: LLE wounds are clean and dry.  No erythema or signs of infection. Ecchymosis left thigh. Sternal wound is clean and dry.  Lab Results: CBC:  Recent Labs  09/08/14 0425 09/09/14 0501  WBC 8.4 10.3  HGB 9.7* 11.0*  HCT 29.0* 33.6*  PLT 131* 160   BMET:   Recent Labs  09/09/14 0501 09/10/14 0357  NA 133* 138  K 3.1* 3.3*  CL 94* 101  CO2 21 21  GLUCOSE 128* 119*  BUN 9 11  CREATININE 0.55 0.47*  CALCIUM 8.2* 8.2*    PT/INR:  Lab Results  Component Value Date   INR 1.69* 09/06/2014   INR 1.20 09/05/2014   INR 1.1* 08/28/2014   ABG:  INR: Will add last result for INR, ABG once components are confirmed Will add last 4 CBG results once components are confirmed  Assessment/Plan:  1. CV - SR in the mid 90's. On Lopressor 25 bid  and Plavix 75 daily. Hypertensive. Will add Lisinopril 2.  Pulmonary - Encourage incentive spirometer. 4.  Acute blood loss anemia - H and H up to 11 and 33.6 5. GI-intermittent nausea yesterday, but less so than before. No more emesis. Tolerating diet. 6. Hypokalemia-potasium 3.3. Supplement and check Magnesium 7. Mild hyponatremia resolved-sodium 138 8. Thrombocytopenia resolved-platelets up to 160,000 9. Remove EPW  10. Possible discharge in am  ZIMMERMAN,[name] 09/10/2014,7:27 AM   Patient has converted to sinus rhythm with intravenous Cardizem drip 8 mg/h We'll continue Cardizem until tomorrow then probably add oral dose to current metoprolol Nausea is improved today-we'll try to minimize oral potassium patient examined and medical record reviewed,agree with above note. VAN TRIGT III,[name] 09/10/2014

## 2014-09-11 DIAGNOSIS — I2510 Atherosclerotic heart disease of native coronary artery without angina pectoris: Principal | ICD-10-CM

## 2014-09-11 DIAGNOSIS — I480 Paroxysmal atrial fibrillation: Secondary | ICD-10-CM

## 2014-09-11 LAB — BASIC METABOLIC PANEL
ANION GAP: 12 (ref 5–15)
BUN: 12 mg/dL (ref 6–23)
CALCIUM: 8.1 mg/dL — AB (ref 8.4–10.5)
CO2: 25 meq/L (ref 19–32)
Chloride: 101 mEq/L (ref 96–112)
Creatinine, Ser: 0.6 mg/dL (ref 0.50–1.35)
GFR calc non Af Amer: >90 mL/min (ref >90)
Glucose, Bld: 109 mg/dL — ABNORMAL HIGH (ref 70–99)
Potassium: 3.6 mEq/L — ABNORMAL LOW (ref 3.7–5.3)
Sodium: 138 mEq/L (ref 137–147)

## 2014-09-11 LAB — MAGNESIUM: MAGNESIUM: 2.2 mg/dL (ref 1.5–2.5)

## 2014-09-11 MED ORDER — METOPROLOL TARTRATE 25 MG/10 ML ORAL SUSPENSION
37.5000 mg | Freq: Two times a day (BID) | ORAL | Status: DC
  Filled 2014-09-11 (×4): qty 15

## 2014-09-11 MED ORDER — METOPROLOL TARTRATE 25 MG PO TABS
37.5000 mg | ORAL_TABLET | Freq: Two times a day (BID) | ORAL | Status: DC
  Administered 2014-09-11 (×2): 37.5 mg via ORAL
  Filled 2014-09-11 (×5): qty 1

## 2014-09-11 MED ORDER — LISINOPRIL 2.5 MG PO TABS
2.5000 mg | ORAL_TABLET | Freq: Every day | ORAL | Status: DC
  Administered 2014-09-11: 2.5 mg via ORAL
  Filled 2014-09-11 (×2): qty 1

## 2014-09-11 MED ORDER — POTASSIUM CHLORIDE CRYS ER 20 MEQ PO TBCR
40.0000 meq | EXTENDED_RELEASE_TABLET | Freq: Once | ORAL | Status: AC
  Administered 2014-09-11: 40 meq via ORAL
  Filled 2014-09-11: qty 2

## 2014-09-11 NOTE — Progress Notes (Addendum)
      [address]       [place] [address]             [phone]        5 Days Post-Op Procedure(s) (LRB): CORONARY ARTERY BYPASS GRAFTING (CABG) (N/A) TRANSESOPHAGEAL ECHOCARDIOGRAM (TEE) (N/A)  Subjective: Patient states nausea is resolved. Had bowel movment.  Objective: Vital signs in last 24 hours: Temp:  [98 F (36.7 C)-98.8 F (37.1 C)] 98 F (36.7 C) (12/16 0600) Pulse Rate:  [91-175] 94 (12/16 0600) Cardiac Rhythm:  [-] Normal sinus rhythm (12/15 1930) Resp:  [18] 18 (12/16 0600) BP: (99-154)/(70-83) 119/83 mmHg (12/16 0821) SpO2:  [94 %-98 %] 94 % (12/16 0600) Weight:  [187 lb 6.3 oz (85 kg)] 187 lb 6.3 oz (85 kg) (12/16 0600)  Pre op weight 90. 3kg Current Weight  09/11/14 187 lb 6.3 oz (85 kg)       Intake/Output from previous day: 12/15 0701 - 12/16 0700 In: 804 [P.O.:720; I.V.:84] Out: 1101 [Urine:1100; Stool:1]   Physical Exam:  Cardiovascular: RRR Pulmonary: Slightly diminished at bases; no rales, wheezes, or rhonchi. Abdomen: Soft, non tender, bowel sounds present. Extremities: Trace bilateral lower extremity edema. Wounds: LLE wounds are clean and dry.  No erythema or signs of infection. Ecchymosis left thigh. Sternal wound is clean and dry.  Lab Results: CBC:  Recent Labs  09/09/14 0501  WBC 10.3  HGB 11.0*  HCT 33.6*  PLT 160   BMET:   Recent Labs  09/10/14 0357 09/11/14 0258  NA 138 138  K 3.3* 3.6*  CL 101 101  CO2 21 25  GLUCOSE 119* 109*  BUN 11 12  CREATININE 0.47* 0.60  CALCIUM 8.2* 8.1*    PT/INR:  Lab Results  Component Value Date   INR 1.69* 09/06/2014   INR 1.20 09/05/2014   INR 1.1* 08/28/2014   ABG:  INR: Will add last result for INR, ABG once components are confirmed Will add last 4 CBG results once components are confirmed  Assessment/Plan:  1. CV - A fib with RVR into 180' earlier this am. SR in the low 100' sat time of exam.On Cardizem drip,Lopressor 25 bid, Digoxin 0.125  daily,Lisinopril 5 daily,and Plavix 75 daily. Will decrease Lisinopril to 2.5 and increase Lopressor to 37.5 bid, Will also ask cardiology to evaluate. 2.  Pulmonary - Encourage incentive spirometer. 4.  Acute blood loss anemia - Last H and H up to 11 and 33.6 5. GI-intermittent nausea resolved. 6. Hypokalemia-potasium up to 3.6. Supplement. Magnesium normal at 2.2 7. Is below pre op weight with trace LE edema. Stop Lasix 8. Remove EPW in am   ZIMMERMAN,DONIELLE MPA-C 09/11/2014,8:38 AM  agree with current plan regarding postop afib-- DC plavix( for CVA 2-3 yrs ago) DCv PW's Avoid amiodarone since his nausea is finally better-  Eliquis for 6 weeks postop then resume plavix patient examined and medical record reviewed,agree with above note. VAN TRIGT III,PETER 09/11/2014

## 2014-09-11 NOTE — Progress Notes (Signed)
CARDIAC REHAB PHASE I   PRE:  Rate/Rhythm: 101 ST with many PACs    BP: sitting 105/91    SaO2: 98 RA  MODE:  Ambulation: to chair   POST:  Rate/Rhythm: 100 ST  Came to try to ambulate however with donning pants PACs increased and pt had brief bout of Afib rate 120s. Returned to [place] with rest. Moved pt to chair as instructions were to not walk in hall if increased afib. Will f/u. [phone]  Josephina Shih [place] CES, ACSM 09/11/2014 10:35 AM

## 2014-09-11 NOTE — Progress Notes (Signed)
Pt afib rvr 180s, verbal order per [place] PA to increase cardizem to 15mg /hr, bp stable and pt sitting on sob, HR down to 107 on monitor prior to increasing drip, PA aware and wants to keep drip at 8mg /hr. Kathleen Argue S 8:25 AM

## 2014-09-11 NOTE — Consult Note (Addendum)
CONSULT NOTE  Date: 09/11/2014               Patient Name:  [name] MRN: [id number]  DOB: Dec 22, 1945 Age / Sex: 68 y.o., male        PCP: Scarlette Calico Primary Cardiologist: Mare Ferrari            Referring Physician: Prescott Gum              Reason for Consult: Atrial fib.             History of Present Illness: Patient is a 68 y.o. male with a PMHx of CAD , who was admitted to [facility] on 09/06/2014 for evaluation of atrial fib. .   The patient recently was seen for UAP.  Cath showed significant 4 V CAD and he was referred for CABG  he was found to have atrial fib yesterday  and we were asked to see for evaluation.  Corbett has a history of strokes in the past. He was evaluated but not have any specific etiology for his stroke. He's never been told that he has atrial fibrillation.  He's been started on diltiazem drip and his metoprolol has been increased. He's converted back to sinus rhythm this morning. He denies any worsening chest pain or shortness breath. He does have the usual soreness from surgery.  Medications: Outpatient medications: Prescriptions prior to admission  Medication Sig Dispense Refill Last Dose  . acetaminophen (TYLENOL) 325 MG tablet Take 2 tablets (650 mg total) by mouth every 4 (four) hours as needed for headache or mild pain.   Past Month at Unknown time  . aspirin 81 MG tablet Take 81 mg by mouth daily.   09/05/2014 at Unknown time  . atenolol-chlorthalidone (TENORETIC) 50-25 MG per tablet Take 1 tablet by mouth daily. 90 tablet 3 09/05/2014 at Unknown time  . atorvastatin (LIPITOR) 20 MG tablet Take 1 tablet (20 mg total) by mouth daily. 90 tablet 3 09/05/2014 at Unknown time  . clopidogrel (PLAVIX) 75 MG tablet Take 75 mg by mouth daily.   Past Week at Unknown time  . isosorbide mononitrate (IMDUR) 15 mg TB24 24 hr tablet Take 0.5 tablets (15 mg total) by mouth daily. (Patient not taking: Reported on 09/05/2014) 30 tablet 5   . potassium  chloride SA (K-DUR,KLOR-CON) 20 MEQ tablet Take 1 tablet (20 mEq total) by mouth daily. 30 tablet 3 09/05/2014 at Unknown time  . FLUZONE HIGH-DOSE 0.5 ML SUSY   0 More than a month at Unknown time  . nitroGLYCERIN (NITROSTAT) 0.4 MG SL tablet Place 1 tablet (0.4 mg total) under the tongue every 5 (five) minutes as needed for chest pain. 25 tablet 2 Unknown at Unknown time    Current medications: Current Facility-Administered Medications  Medication Dose Route Frequency Provider Last Rate Last Dose  . 0.9 %  sodium chloride infusion  250 mL Intravenous PRN Ivin Poot, MD      . acetaminophen (TYLENOL) tablet 1,000 mg  1,000 mg Oral 4 times per day Nani Skillern, PA-C   1,000 mg at 09/07/14 1757   Or  . acetaminophen (TYLENOL) solution 1,000 mg  1,000 mg Per Tube 4 times per day Nani Skillern, PA-C      . antiseptic oral rinse (CPC / CETYLPYRIDINIUM CHLORIDE 0.05%) solution 7 mL  7 mL Mouth Rinse QID Ivin Poot, MD   7 mL at 09/08/14 1621  . aspirin EC tablet 81  mg  81 mg Oral Daily Nani Skillern, PA-C   81 mg at 09/11/14 1014  . atorvastatin (LIPITOR) tablet 20 mg  20 mg Oral Daily Donielle Liston Alba, PA-C   20 mg at 09/11/14 1014  . clopidogrel (PLAVIX) tablet 75 mg  75 mg Oral Daily Ivin Poot, MD   75 mg at 09/11/14 1015  . digoxin (LANOXIN) tablet 0.125 mg  0.125 mg Oral Daily Donielle Liston Alba, PA-C   0.125 mg at 09/11/14 1016  . diltiazem (CARDIZEM) 100 mg in dextrose 5 % 100 mL (1 mg/mL) infusion  8 mg/hr Intravenous Continuous Nani Skillern, PA-C 8 mL/hr at 09/11/14 0813 8 mg/hr at 09/11/14 0813  . lisinopril (PRINIVIL,ZESTRIL) tablet 2.5 mg  2.5 mg Oral Daily Donielle Liston Alba, PA-C   2.5 mg at 09/11/14 1018  . LORazepam (ATIVAN) injection 1 mg  1 mg Intravenous Q8H PRN Melrose Nakayama, MD   1 mg at 09/10/14 2124  . magnesium hydroxide (MILK OF MAGNESIA) suspension 30 mL  30 mL Oral Daily PRN Ivin Poot, MD      . metoprolol  (LOPRESSOR) injection 2.5-5 mg  2.5-5 mg Intravenous Q2H PRN Nani Skillern, PA-C   5 mg at 09/10/14 0805  . metoprolol tartrate (LOPRESSOR) tablet 37.5 mg  37.5 mg Oral BID Nani Skillern, PA-C   37.5 mg at 09/11/14 1018   Or  . metoprolol tartrate (LOPRESSOR) 25 mg/10 mL oral suspension 37.5 mg  37.5 mg Per Tube BID Nani Skillern, PA-C      . ondansetron [facility]) injection 4 mg  4 mg Intravenous Q6H PRN Nani Skillern, PA-C   4 mg at 09/10/14 1447  . pantoprazole (PROTONIX) EC tablet 40 mg  40 mg Oral Daily Donielle Liston Alba, PA-C   40 mg at 09/11/14 1014  . sodium chloride 0.9 % injection 10-40 mL  10-40 mL Intracatheter Q12H Ivin Poot, MD   10 mL at 09/08/14 0931  . sodium chloride 0.9 % injection 10-40 mL  10-40 mL Intracatheter PRN Ivin Poot, MD   20 mL at 09/07/14 1742  . sodium chloride 0.9 % injection 3 mL  3 mL Intravenous Q12H Donielle Liston Alba, PA-C   3 mL at 09/10/14 1000  . sodium chloride 0.9 % injection 3 mL  3 mL Intravenous PRN Nani Skillern, PA-C   3 mL at 09/07/14 1025  . sodium chloride 0.9 % injection 3 mL  3 mL Intravenous Q12H Ivin Poot, MD   3 mL at 09/09/14 2210  . sodium chloride 0.9 % injection 3 mL  3 mL Intravenous PRN Ivin Poot, MD      . traMADol Veatrice Bourbon) tablet 50-100 mg  50-100 mg Oral Q4H PRN Nani Skillern, PA-C         No Known Allergies   Past Medical History  Diagnosis Date  . Alcohol abuse, unspecified   . Hypertension   . Hyperlipidemia     preTx 114, postTx 47 LDL  . H/O renal calculi   . Arthritis     in back and shoulder  . Anginal pain   . Shortness of breath dyspnea     with exertion  . Stroke     August '11 x 2: L. cerebellar, lateral medulla, ol rd lacunar infarcts;  2nd left pontine infarct.  . History of kidney stones     Past Surgical History  Procedure Laterality Date  . Tonsillectomy  1954  . Lumbar disc surgery  1986    Dr. Juanetta Snow  . Tonsillectomy    .  Back surgery  1986  . Rotator cuff repair  06/10/12    at [facility]...left shoulder  . Left heart catheterization with coronary angiogram N/A 08/29/2014    Procedure: LEFT HEART CATHETERIZATION WITH CORONARY ANGIOGRAM;  Surgeon: Lorretta Harp, MD;  Location: [facility] CATH LAB;  Service: Cardiovascular;  Laterality: N/A;  . Coronary artery bypass graft N/A 09/06/2014    Procedure: CORONARY ARTERY BYPASS GRAFTING (CABG);  Surgeon: Ivin Poot, MD;  Location: [facility];  Service: Open Heart Surgery;  Laterality: N/A;  CABG times 4, using internal mammary artery, and right leg saphenous vein harvested endoscopically  . Tee without cardioversion N/A 09/06/2014    Procedure: TRANSESOPHAGEAL ECHOCARDIOGRAM (TEE);  Surgeon: Ivin Poot, MD;  Location: [facility];  Service: Open Heart Surgery;  Laterality: N/A;    Family History  Problem Relation Age of Onset  . Heart disease Father   . Hypertension Father   . Cancer Neg Hx   . Diabetes Neg Hx   . Early death Neg Hx   . Hearing loss Neg Hx   . Hyperlipidemia Neg Hx   . Kidney disease Neg Hx   . Stroke Neg Hx   . Alcohol abuse Neg Hx     Social History:  reports that he quit smoking about 35 years ago. His smoking use included Cigarettes. He has a 15 pack-year smoking history. He has never used smokeless tobacco. He reports that he drinks about 3.6 oz of alcohol per week. He reports that he does not use illicit drugs.   Review of Systems: Constitutional:  denies fever, chills, diaphoresis, appetite change and fatigue.  HEENT: denies photophobia, eye pain, redness, hearing loss, ear pain, congestion, sore throat, rhinorrhea, sneezing, neck pain, neck stiffness and tinnitus.  Respiratory: denies SOB, DOE, cough, chest tightness, and wheezing.  Cardiovascular: denies chest pain, palpitations and leg swelling.  Gastrointestinal: denies nausea, vomiting, abdominal pain, diarrhea, constipation, blood in stool.  Genitourinary: denies  dysuria, urgency, frequency, hematuria, flank pain and difficulty urinating.  Musculoskeletal: denies  myalgias, back pain, joint swelling, arthralgias and gait problem.   Skin: denies pallor, rash and wound.  Neurological: denies dizziness, seizures, syncope, weakness, light-headedness, numbness and headaches.   Hematological: denies adenopathy, easy bruising, personal or family bleeding history.  Psychiatric/ Behavioral: denies suicidal ideation, mood changes, confusion, nervousness, sleep disturbance and agitation.    Physical Exam: BP 105/91 mmHg  Pulse 100  Temp(Src) 98 F (36.7 C) (Oral)  Resp 18  Ht 6\' 1"  (1.854 m)  Wt 187 lb 6.3 oz (85 kg)  BMI 24.73 kg/m2  SpO2 94%  Wt Readings from Last 3 Encounters:  09/11/14 187 lb 6.3 oz (85 kg)  09/05/14 190 lb 3.2 oz (86.274 kg)  08/29/14 190 lb (86.183 kg)    General: Vital signs reviewed and noted. Well-developed, well-nourished, in no acute distress; alert,   Head: Normocephalic, atraumatic, sclera anicteric,   Neck: Supple. Negative for carotid bruits. No JVD   Lungs:  Clear bilaterally, no  wheezes, rales, or rhonchi. Breathing is normal   Heart: RRR with S1 S2. No murmurs, rubs, or gallops   Abdomen:  Soft, non-tender, non-distended with normoactive bowel sounds. No hepatomegaly. No rebound/guarding. No obvious abdominal masses   MSK: Strength and the appear normal for age.   Extremities: No clubbing or cyanosis. No edema.  Distal pedal pulses are  2+ and equal   Neurologic: Alert and oriented X 3. Moves all extremities spontaneously.  Psych: Responds to questions appropriately with a normal affect.     Lab results: Basic Metabolic Panel:  Recent Labs Lab 09/06/14 2000  09/07/14 0439 09/07/14 1743  09/09/14 0501 09/10/14 0357 09/11/14 0258  NA  --   < > 140  --   < > 133* 138 138  K  --   < > 3.2*  --   < > 3.1* 3.3* 3.6*  CL  --   < > 104  --   < > 94* 101 101  CO2  --   --  22  --   < > 21 21 25   GLUCOSE  --    < > 108*  --   < > 128* 119* 109*  BUN  --   < > 7  --   < > 9 11 12   CREATININE 0.57  < > 0.60 0.53  < > 0.55 0.47* 0.60  CALCIUM  --   --  7.6*  --   < > 8.2* 8.2* 8.1*  MG 2.9*  --  2.5 2.6*  --   --   --  2.2  < > = values in this interval not displayed.  Liver Function Tests:  Recent Labs Lab 09/05/14 1515  AST 43*  ALT 38  ALKPHOS 59  BILITOT 0.6  PROT 7.3  ALBUMIN 4.1   No results for input(s): LIPASE, AMYLASE in the last 168 hours. No results for input(s): AMMONIA in the last 168 hours.  CBC:  Recent Labs Lab 09/06/14 2000  09/07/14 0439 09/07/14 1743 09/07/14 1747 09/08/14 0425 09/09/14 0501  WBC 8.0  --  8.6 9.3  --  8.4 10.3  HGB 10.7*  < > 9.5* 9.9* 9.9* 9.7* 11.0*  HCT 32.1*  < > 28.0* 29.4* 29.0* 29.0* 33.6*  MCV 91.2  --  91.2 92.2  --  92.7 92.8  PLT 154  --  151 141*  --  131* 160  < > = values in this interval not displayed.  Cardiac Enzymes: No results for input(s): CKTOTAL, CKMB, CKMBINDEX, TROPONINI in the last 168 hours.  BNP: Invalid input(s): POCBNP  CBG:  Recent Labs Lab 09/08/14 0031 09/08/14 0410 09/08/14 0818 09/08/14 2121 09/09/14 0622  GLUCAP 121* 93 97 124* 129*    Coagulation Studies: No results for input(s): LABPROT, INR in the last 72 hours.   Other results:  EKG - yesterday  - Atrial Fib with RVR  Today :  Sinus tach.    Echo:  Normal LV function     Assessment & Plan:  1. Atrial fibrillation: The patient has a history of strokes in the past. While this might just be typical postoperative atrial fibrillation, we cannot rule out the possibility that he has had paroxysmal atrial fibrillation in the past which led to his strokes. He has a CHADS VASC2 score of 5 ( HTN, age, previous stroke, CAD)   RESULTS  Total Points Thromboembolism Risk Thromboembolism Rate 1 (% per year, no aspirin)  5 High Risk 6.7% (95% CI, 4.9% to 9.1%)    I think he should be on long term anticoagulation .  If he is started on  NOAC, I would DC plavix since it was prescribed for his previous CVA.  Will let the surgery team weigh in on the timing of initiation of the Eliquis since he is post op.  Agree with increasing dose  of metoprolol - he may need more.   Following    Ramond Dial., MD, [facility] 09/11/2014, 10:34 AM Office - [phone] Pager [phone]

## 2014-09-12 DIAGNOSIS — Z951 Presence of aortocoronary bypass graft: Secondary | ICD-10-CM

## 2014-09-12 MED ORDER — METOPROLOL TARTRATE 25 MG/10 ML ORAL SUSPENSION
50.0000 mg | Freq: Two times a day (BID) | ORAL | Status: DC
  Filled 2014-09-12 (×4): qty 20

## 2014-09-12 MED ORDER — METOPROLOL TARTRATE 50 MG PO TABS
50.0000 mg | ORAL_TABLET | Freq: Two times a day (BID) | ORAL | Status: DC
Start: 2014-09-12 — End: 2014-09-13
  Administered 2014-09-12 (×2): 50 mg via ORAL
  Filled 2014-09-12 (×4): qty 1

## 2014-09-12 NOTE — Progress Notes (Signed)
Met with the patient at the bedside to answer any questions regarding [facility] [employer] services as a benefit of his [employer] insurance.  Answered questions regarding his need for follow up care.  He states he is currently interested in the Cardiac Rehab program and feels that it would be the only program he would need, currently.  He does have the information packet Marthenia Rolling RN, [facility] for [facility],  had given him a couple of days ago.  He states that after he and his wife reviews the information he will call our office, if they decide to sign up.  Questions regarding transportation and a potential benefit from his [employer] program was answered and he currently states he has friends that can assist.  Patient encouraged to call his [employer] benefit line for other details. Patient was informed that [facility] [facility] [employer] services does not replace or interfere with any services set up by the inpatient RNCM or Social worker.  Please call, Natividad Brood, RN, BSN, [facility] Liaison at [phone], for questions.

## 2014-09-12 NOTE — Progress Notes (Signed)
   TELEMETRY: Reviewed telemetry pt in NSR. Had brief episode of afib at 9 am.: Filed Vitals:   09/11/14 1230 09/11/14 2005 09/12/14 0446 09/12/14 0453  BP: 123/80 145/89 106/69   Pulse: 88 98 89   Temp: 97.9 F (36.6 C) 98.4 F (36.9 C) 98.1 F (36.7 C)   TempSrc: Oral Oral Oral   Resp: 16 18 18    Height:      Weight:    186 lb 15.2 oz (84.8 kg)  SpO2: 96% 97% 96%     Intake/Output Summary (Last 24 hours) at 09/12/14 1314 Last data filed at 09/12/14 0703  Gross per 24 hour  Intake  24323 ml  Output    550 ml  Net  23773 ml   Filed Weights   09/10/14 0454 09/11/14 0600 09/12/14 0453  Weight: 188 lb 3.2 oz (85.367 kg) 187 lb 6.3 oz (85 kg) 186 lb 15.2 oz (84.8 kg)    Subjective Feels much better. Ambulating well.  Mar[place] Kitchen antiseptic oral rinse  7 mL Mouth Rinse QID  . aspirin EC  81 mg Oral Daily  . atorvastatin  20 mg Oral Daily  . metoprolol tartrate  50 mg Oral BID   Or  . metoprolol tartrate  50 mg Per Tube BID  . pantoprazole  40 mg Oral Daily  . sodium chloride  10-40 mL Intracatheter Q12H  . sodium chloride  3 mL Intravenous Q12H  . sodium chloride  3 mL Intravenous Q12H   . diltiazem (CARDIZEM) infusion 8 mg/hr (09/12/14 0820)    LABS: Basic Metabolic Panel:  Recent Labs  09/10/14 0357 09/11/14 0258  NA 138 138  K 3.3* 3.6*  CL 101 101  CO2 21 25  GLUCOSE 119* 109*  BUN 11 12  CREATININE 0.47* 0.60  CALCIUM 8.2* 8.1*  MG  --  2.2     Radiology/Studies:  CHEST 2 VIEW  COMPARISON: 09/08/2014  FINDINGS: The right jugular central venous catheter has been removed. Median sternotomy wires with post CABG changes. Epicardial pacer wires are present. Small bilateral pleural effusions with basilar atelectasis. Negative for a pneumothorax. Heart size is mildly enlarged.  IMPRESSION: Bibasilar chest densities are most compatible with small pleural effusions and atelectasis.   Electronically Signed By: Markus Daft M.D. On: 09/09/2014  07:53  PHYSICAL EXAM General: Well developed, well nourished, in no acute distress. Head: Normocephalic, atraumatic, sclera non-icteric, oropharynx is clear Neck: Negative for carotid bruits. JVD not elevated. No adenopathy Lungs: Clear bilaterally to auscultation without wheezes, rales, or rhonchi. Breathing is unlabored. Heart: RRR S1 S2 without murmurs, rubs, or gallops.  Abdomen: Soft, non-tender, non-distended with normoactive bowel sounds.  Extremities: No clubbing, cyanosis or edema.  Distal pedal pulses are 2+ and equal bilaterally. Neuro: Alert and oriented X 3. Moves all extremities spontaneously. Psych:  Responds to questions appropriately with a normal affect.  ASSESSMENT AND PLAN: 1. Atrial fibrillation: now converted to NSR. Would DC IV Cardizem. Continue metoprolol. BP a little soft. If afib recurs may need to increase metoprolol or consider short term of amiodarone until he has recovered post op. CHADS-Vasc score is high. Starting Eliquis now is problematic with Plavix on board. Hopefully if he maintains NSR we can postpone decision on this.  2. S/p CABG  Present on Admission:  . CAD (coronary artery disease)  Signed, Peter Martinique, [place] 09/12/2014 1:14 PM

## 2014-09-12 NOTE — Progress Notes (Addendum)
      [address]       [place] [address]             [phone]        6 Days Post-Op Procedure(s) (LRB): CORONARY ARTERY BYPASS GRAFTING (CABG) (N/A) TRANSESOPHAGEAL ECHOCARDIOGRAM (TEE) (N/A)  Subjective: Patient sitting in chair without complaints  Objective: Vital signs in last 24 hours: Temp:  [97.9 F (36.6 C)-98.4 F (36.9 C)] 98.1 F (36.7 C) (12/17 0446) Pulse Rate:  [88-100] 89 (12/17 0446) Cardiac Rhythm:  [-] Normal sinus rhythm (12/16 1950) Resp:  [16-18] 18 (12/17 0446) BP: (105-145)/(69-91) 106/69 mmHg (12/17 0446) SpO2:  [96 %-97 %] 96 % (12/17 0446) Weight:  [186 lb 15.2 oz (84.8 kg)] 186 lb 15.2 oz (84.8 kg) (12/17 0453)  Pre op weight 90. 3kg Current Weight  09/12/14 186 lb 15.2 oz (84.8 kg)       Intake/Output from previous day: 12/16 0701 - 12/17 0700 In: 513 [P.O.:240; I.V.:273] Out: 400 [Urine:400]   Physical Exam:  Cardiovascular: Tachycardic Pulmonary: Slightly diminished at bases; no rales, wheezes, or rhonchi. Abdomen: Soft, non tender, bowel sounds present. Extremities: Trace bilateral lower extremity edema. Wounds: LLE wounds are clean and dry.  No erythema or signs of infection. Ecchymosis left thigh. Sternal wound is clean and dry.  Lab Results: CBC: No results for input(s): WBC, HGB, HCT, PLT in the last 72 hours. BMET:   Recent Labs  09/10/14 0357 09/11/14 0258  NA 138 138  K 3.3* 3.6*  CL 101 101  CO2 21 25  GLUCOSE 119* 109*  BUN 11 12  CREATININE 0.47* 0.60  CALCIUM 8.2* 8.1*    PT/INR:  Lab Results  Component Value Date   INR 1.69* 09/06/2014   INR 1.20 09/05/2014   INR 1.1* 08/28/2014   ABG:  INR: Will add last result for INR, ABG once components are confirmed Will add last 4 CBG results once components are confirmed  Assessment/Plan:  1. CV - PAF with RVR. ST in the low 100's at time of exam.On Cardizem drip,Lopressor 37.5 bid, Digoxin 0.125 daily,Lisinopril 2.5 daily,and  Plavix 75 daily. Will increase Lopressor, stop Lisinopril, and stop Digoxin. Consider stopping Cardizem drip and starting oral. Will discuss with Dr. Prescott Gum stopping Plavix and starting NOAC (i.e. Eliquis)  for PAF  2.  Pulmonary - Encourage incentive spirometer. 4.  Acute blood loss anemia - Last H and H up to 11 and 33.6 5. Remove EPW    ZIMMERMAN,DONIELLE MPA-C 09/12/2014,7:30 AM

## 2014-09-12 NOTE — Progress Notes (Addendum)
CARDIAC REHAB PHASE I   PRE:  Rate/Rhythm: 89 SR  BP:  Supine:   Sitting: 112/70  Standing:    SaO2: 98 RA  MODE:  Ambulation: 350 ft   POST:  Rate/Rhythm: 95 SR  BP:  Supine:   Sitting: 24/78  Standing:    SaO2: 98 RA 1120-1155 Assisted X 1 and used walker to ambulate. Gait steady with walker. Pt able to walk 350 feet with 2-3 standing rest stops. HR stayed 80-90 SR throughout way. Pt back to chair after walk with call light in reach and wife present. Pt very excited that he was able to walk and his HR stayed down and rhythm regular.  Rodney Langton RN 09/12/2014 11:48 AM

## 2014-09-13 DIAGNOSIS — I4891 Unspecified atrial fibrillation: Secondary | ICD-10-CM

## 2014-09-13 MED ORDER — COUMADIN BOOK
Freq: Once | Status: AC
  Administered 2014-09-13: 11:00:00
  Filled 2014-09-13: qty 1

## 2014-09-13 MED ORDER — ALPRAZOLAM 0.25 MG PO TABS: 0.2500 mg | ORAL_TABLET | Freq: Two times a day (BID) | ORAL | Status: DC | PRN

## 2014-09-13 MED ORDER — WARFARIN - PHYSICIAN DOSING INPATIENT
Freq: Every day | Status: DC
  Administered 2014-09-13: 18:00:00

## 2014-09-13 MED ORDER — WARFARIN SODIUM 2.5 MG PO TABS
2.5000 mg | ORAL_TABLET | Freq: Every day | ORAL | Status: DC
  Administered 2014-09-13 – 2014-09-14 (×2): 2.5 mg via ORAL
  Filled 2014-09-13 (×3): qty 1

## 2014-09-13 MED ORDER — DILTIAZEM HCL ER COATED BEADS 120 MG PO CP24
120.0000 mg | ORAL_CAPSULE | Freq: Every day | ORAL | Status: DC
  Filled 2014-09-13: qty 1

## 2014-09-13 MED ORDER — METOPROLOL TARTRATE 25 MG PO TABS
25.0000 mg | ORAL_TABLET | Freq: Two times a day (BID) | ORAL | Status: DC
  Administered 2014-09-13 – 2014-09-14 (×3): 25 mg via ORAL
  Filled 2014-09-13 (×4): qty 1

## 2014-09-13 MED ORDER — ALPRAZOLAM 0.25 MG PO TABS: 0.2500 mg | ORAL_TABLET | Freq: Three times a day (TID) | ORAL | Status: DC | PRN

## 2014-09-13 MED ORDER — LORAZEPAM 2 MG/ML IJ SOLN: 1.0000 mg | Freq: Every evening | INTRAMUSCULAR | Status: DC | PRN

## 2014-09-13 MED ORDER — DILTIAZEM HCL ER COATED BEADS 120 MG PO CP24
120.0000 mg | ORAL_CAPSULE | Freq: Every day | ORAL | Status: DC
  Administered 2014-09-13 – 2014-09-14 (×2): 120 mg via ORAL
  Filled 2014-09-13 (×2): qty 1

## 2014-09-13 MED ORDER — WARFARIN VIDEO
Freq: Once | Status: AC
  Administered 2014-09-13: 11:00:00

## 2014-09-13 MED ORDER — LORAZEPAM 2 MG/ML IJ SOLN
1.0000 mg | Freq: Every evening | INTRAMUSCULAR | Status: DC | PRN
  Administered 2014-09-13 – 2014-09-14 (×2): 1 mg via INTRAVENOUS
  Filled 2014-09-13 (×2): qty 1

## 2014-09-13 NOTE — Care Management Note (Addendum)
    Page 1 of 2   09/13/2014     3:36:34 PM CARE MANAGEMENT NOTE 09/13/2014  Patient:  [name], [name]   Account Number:  [id number]  Date Initiated:  09/09/2014  Documentation initiated by:  Marvetta Gibbons  Subjective/Objective Assessment:   Pt admitted s/p CABGx4 on 12/11     Action/Plan:   PTA pt lived at home with wife   Anticipated DC Date:  09/14/2014   Anticipated DC Plan:  [place]  CM consult      PAC Choice  DURABLE MEDICAL EQUIPMENT   Choice offered to / List presented to:  C-1 Patient   DME arranged  Vassie Moselle      DME agency  [facility]        Status of service:  Completed, signed off Medicare Important Message given?  YES (If response is "NO", the following Medicare IM given date fields will be blank) Date Medicare IM given:  09/09/2014 Medicare IM given by:  Marvetta Gibbons Date Additional Medicare IM given:  09/13/2014 Additional Medicare IM given by:  [facility] Deaveon Schoen  Discharge Disposition:    Per UR Regulation:  Reviewed for med. necessity/level of care/duration of stay  If discussed at [place] of Stay Meetings, dates discussed:   09/12/2014    Comments:  12/18  1534 debbie Shrihaan Porzio rn,bsn spoke w pt. he needs rw and plans for dc over weekend. pt's ins [employer] medicare has contract w [employer]. order and demographic faxed to [employer] so rw could be del this weekend.  12/18  1305 debbie Jamariah Tony rn,bsn if pt decided to take eliquis his copay would be 45.00 per month w no prior auth req.  09/11/14- 1400- Marvetta Gibbons RN, BSN [phone] Per Orrin Brigham with [facility] they have reached out to pt - but pt has not decided yet on [place] to f/u regarding following pt at discharge- pt currently on Cardizem gtt for afib.

## 2014-09-13 NOTE — Progress Notes (Addendum)
1400-1505 Cardiac Rehab Pt has been up ambulating in hall independently using walker. He denies any problems. Completed discharge education with pt. He voices understanding. Pt agrees to [place]. CRP in [place], will send referral. Pt had many questions answered them. Pt and his wife plan to watch discharge video later. Deon Pilling, RN 09/13/2014 3:04 PM

## 2014-09-13 NOTE — Progress Notes (Signed)
   TELEMETRY: Reviewed telemetry pt in NSR. Still having episodes of AFib with RVR..: Filed Vitals:   09/12/14 0453 09/12/14 1352 09/12/14 2053 09/13/14 0555  BP:  113/75 129/74 110/71  Pulse:  96 99 86  Temp:  97.7 F (36.5 C) 99.1 F (37.3 C) 98.2 F (36.8 C)  TempSrc:  Oral Oral Oral  Resp:  18 18 18   Height:      Weight: 186 lb 15.2 oz (84.8 kg)   187 lb 8 oz (85.049 kg)  SpO2:  98% 95% 94%    Intake/Output Summary (Last 24 hours) at 09/13/14 1217 Last data filed at 09/13/14 0900  Gross per 24 hour  Intake    720 ml  Output   1900 ml  Net  -1180 ml   Filed Weights   09/11/14 0600 09/12/14 0453 09/13/14 0555  Weight: 187 lb 6.3 oz (85 kg) 186 lb 15.2 oz (84.8 kg) 187 lb 8 oz (85.049 kg)    Subjective Feels much better. Ambulating well.  Marland Kitchen antiseptic oral rinse  7 mL Mouth Rinse QID  . aspirin EC  81 mg Oral Daily  . atorvastatin  20 mg Oral Daily  . coumadin book   Does not apply Once  . diltiazem  120 mg Oral Daily  . metoprolol tartrate  25 mg Oral BID  . pantoprazole  40 mg Oral Daily  . sodium chloride  10-40 mL Intracatheter Q12H  . sodium chloride  3 mL Intravenous Q12H  . sodium chloride  3 mL Intravenous Q12H  . warfarin  2.5 mg Oral q1800  . warfarin   Does not apply Once  . Warfarin - Physician Dosing Inpatient   Does not apply q1800      LABS: Basic Metabolic Panel:  Recent Labs  09/11/14 0258  NA 138  K 3.6*  CL 101  CO2 25  GLUCOSE 109*  BUN 12  CREATININE 0.60  CALCIUM 8.1*  MG 2.2     Radiology/Studies:  CHEST 2 VIEW  COMPARISON: 09/08/2014  FINDINGS: The right jugular central venous catheter has been removed. Median sternotomy wires with post CABG changes. Epicardial pacer wires are present. Small bilateral pleural effusions with basilar atelectasis. Negative for a pneumothorax. Heart size is mildly enlarged.  IMPRESSION: Bibasilar chest densities are most compatible with small pleural effusions and  atelectasis.   Electronically Signed By: Markus Daft M.D. On: 09/09/2014 07:53  PHYSICAL EXAM General: Well developed, well nourished, in no acute distress. Head: Normocephalic, atraumatic, sclera non-icteric, oropharynx is clear Neck: Negative for carotid bruits. JVD not elevated. No adenopathy Lungs: Clear bilaterally to auscultation without wheezes, rales, or rhonchi. Breathing is unlabored. Heart: RRR S1 S2 without murmurs, rubs, or gallops.  Abdomen: Soft, non-tender, non-distended with normoactive bowel sounds.  Extremities: No clubbing, cyanosis or edema.  Distal pedal pulses are 2+ and equal bilaterally. Neuro: Alert and oriented X 3. Moves all extremities spontaneously. Psych:  Responds to questions appropriately with a normal affect.  ASSESSMENT AND PLAN: 1. Atrial fibrillation: In NSR most of the time. Still having AFib with RVR. Transitioning to oral Cardizem. Continue metoprolol. BP a little soft. If afib recurs may need to consider short term of amiodarone until he has recovered post op. CHADS-Vasc score is high. Starting Coumadin now. Plavix stopped.   On ASA 81 mg daily. 2. S/p CABG  Present on Admission:  . CAD (coronary artery disease)  Signed, Mathew Storck [place], [place] 09/13/2014 12:17 PM

## 2014-09-13 NOTE — Progress Notes (Addendum)
      [address]       [place] [address]             [phone]        7 Days Post-Op Procedure(s) (LRB): CORONARY ARTERY BYPASS GRAFTING (CABG) (N/A) TRANSESOPHAGEAL ECHOCARDIOGRAM (TEE) (N/A)  Subjective: Patient sitting in chair without complaints. He wants to walk today.  Objective: Vital signs in last 24 hours: Temp:  [97.7 F (36.5 C)-99.1 F (37.3 C)] 98.2 F (36.8 C) (12/18 0555) Pulse Rate:  [86-99] 86 (12/18 0555) Cardiac Rhythm:  [-] Normal sinus rhythm (12/17 1930) Resp:  [18] 18 (12/18 0555) BP: (110-129)/(71-75) 110/71 mmHg (12/18 0555) SpO2:  [94 %-98 %] 94 % (12/18 0555) Weight:  [187 lb 8 oz (85.049 kg)] 187 lb 8 oz (85.049 kg) (12/18 0555)  Pre op weight 90. 3kg Current Weight  09/13/14 187 lb 8 oz (85.049 kg)       Intake/Output from previous day: 12/17 0701 - 12/18 0700 In: 720 [P.O.:720] Out: 2350 [Urine:2350]   Physical Exam:  Cardiovascular: RRR Pulmonary: Slightly diminished at bases; no rales, wheezes, or rhonchi. Abdomen: Soft, non tender, bowel sounds present. Extremities: Trace bilateral lower extremity edema. Wounds: LLE wounds are clean and dry.  No erythema or signs of infection. Ecchymosis left thigh. Sternal wound is clean and dry.  Lab Results: CBC: No results for input(s): WBC, HGB, HCT, PLT in the last 72 hours. BMET:   Recent Labs  09/11/14 0258  NA 138  K 3.6*  CL 101  CO2 25  GLUCOSE 109*  BUN 12  CREATININE 0.60  CALCIUM 8.1*    PT/INR:  Lab Results  Component Value Date   INR 1.69* 09/06/2014   INR 1.20 09/05/2014   INR 1.1* 08/28/2014   ABG:  INR: Will add last result for INR, ABG once components are confirmed Will add last 4 CBG results once components are confirmed  Assessment/Plan:  1. CV - PAF with RVR. SR in the low 90's at time of exam.On Cardizem drip,Lopressor 50 bid daily. Will start Cardizem CD 120 mg (per Dr. Prescott Gum) and stop drip. Will decrease Lopressor as  will not  not tolerate at current dose. Dr. Prescott Gum stopped Plavix last evening. Will discuss anticoagulation with him this am.  2.  Pulmonary - Encourage incentive spirometer. 4.  Acute blood loss anemia - Last H and H up to 11 and 33.6 5. Remove EPW  6. Hopefully, home this weekend  ZIMMERMAN,DONIELLE MPA-C 09/13/2014,7:24 AM   Patient cannot afford Eliquis Will start low doe coumadin Metoprolol plus low dose diltiazem for afib due to severe nausea problems Home in about 2 days if he maintains nsr  patient examined and medical record reviewed,agree with above note. VAN TRIGT III,PETER 09/13/2014

## 2014-09-13 NOTE — Discharge Instructions (Signed)
Activity: 1.May walk up steps °               2.No lifting more than ten pounds for four weeks.  °               3.No driving for four weeks. °               4.Stop any activity that causes chest pain, shortness of breath, dizziness, sweating or excessive weakness. °               5.Avoid straining. °               6.Continue with your breathing exercises daily. ° °Diet: Diabetic diet and Low fat, Low salt diet ° °Wound Care: May shower.  Clean wounds with mild soap and water daily. Contact the office at [phone] if any problems arise. ° °Coronary Artery Bypass Grafting, Care After °Refer to this sheet in the next few weeks. These instructions provide you with information on caring for yourself after your procedure. Your health care provider may also give you more specific instructions. Your treatment has been planned according to current medical practices, but problems sometimes occur. Call your health care provider if you have any problems or questions after your procedure. °WHAT TO EXPECT AFTER THE PROCEDURE °Recovery from surgery will be different for everyone. Some people feel well after 3 or 4 weeks, while for others it takes longer. After your procedure, it is typical to have the following: °· Nausea and a lack of appetite.   °· Constipation. °· Weakness and fatigue.   °· Depression or irritability.   °· Pain or discomfort at your incision site. °HOME CARE INSTRUCTIONS °· Take medicines only as directed by your health care provider. Do not stop taking medicines or start any new medicines without first checking with your health care provider. °· Take your pulse as directed by your health care provider. °· Perform deep breathing as directed by your health care provider. If you were given a device called an incentive spirometer, use it to practice deep breathing several times a day. Support your chest with a pillow or your arms when you take deep breaths or cough. °· Keep incision areas clean, dry, and  protected. Remove or change any bandages (dressings) only as directed by your health care provider. You may have skin adhesive strips over the incision areas. Do not take the strips off. They will fall off on their own. °· Check incision areas daily for any swelling, redness, or drainage. °· If incisions were made in your legs, do the following: °· Avoid crossing your legs.   °· Avoid sitting for long periods of time. Change positions every 30 minutes.   °· Elevate your legs when you are sitting. °· Wear compression stockings as directed by your health care provider. These stockings help keep blood clots from forming in your legs. °· Take showers once your health care provider approves. Until then, only take sponge baths. Pat incisions dry. Do not rub incisions with a washcloth or towel. Do not take baths, swim, or use a hot tub until your health care provider approves. °· Eat foods that are high in fiber, such as raw fruits and vegetables, whole grains, beans, and nuts. Meats should be lean cut. Avoid canned, processed, and fried foods. °· Drink enough fluid to keep your urine clear or pale yellow. °· Weigh yourself every day. This helps identify if you are retaining fluid that may make your heart and lungs   work harder.  Rest and limit activity as directed by your health care provider. You may be instructed to:  Stop any activity at once if you have chest pain, shortness of breath, irregular heartbeats, or dizziness. Get help right away if you have any of these symptoms.  Move around frequently for short periods or take short walks as directed by your health care provider. Increase your activities gradually. You may need physical therapy or cardiac rehabilitation to help strengthen your muscles and build your endurance.  Avoid lifting, pushing, or pulling anything heavier than 10 lb (4.5 kg) for at least 6 weeks after surgery.  Do not drive until your health care provider approves.  Ask your health  care provider when you may return to work.  Ask your health care provider when you may resume sexual activity.  Keep all follow-up visits as directed by your health care provider. This is important. SEEK MEDICAL CARE IF:  You have swelling, redness, increasing pain, or drainage at the site of an incision.  You have a fever.  You have swelling in your ankles or legs.  You have pain in your legs.   You gain 2 or more pounds (0.9 kg) a day.  You are nauseous or vomit.  You have diarrhea. SEEK IMMEDIATE MEDICAL CARE IF:  You have chest pain that goes to your jaw or arms.  You have shortness of breath.   You have a fast or irregular heartbeat.   You notice a "clicking" in your breastbone (sternum) when you move.   You have numbness or weakness in your arms or legs.  You feel dizzy or light-headed.  MAKE SURE YOU:  Understand these instructions.  Will watch your condition.  Will get help right away if you are not doing well or get worse. Document Released: 04/02/2005 Document Revised: 01/28/2014 Document Reviewed: 02/20/2013 [facility] Patient Information 2015 [place], [place]. This information is not intended to replace advice given to you by your health care provider. Make sure you discuss any questions you have with your health care provider.  Warfarin: What You Need to Know Warfarin is an anticoagulant. Anticoagulants help prevent the formation of blood clots. They also help stop the growth of blood clots. Warfarin is sometimes referred to as a "blood thinner."  Normally, when body tissues are cut or damaged, the blood clots in order to prevent blood loss. Sometimes clots form inside your blood vessels and obstruct the flow of blood through your circulatory system (thrombosis). These clots may travel through your bloodstream and become lodged in smaller blood vessels in your brain, which can cause a stroke, or in your lungs (pulmonary embolism). WHO SHOULD USE  WARFARIN? Warfarin is prescribed for people at risk of developing harmful blood clots:  People with surgically implanted mechanical heart valves, irregular heart rhythms called atrial fibrillation, and certain clotting disorders.  People who have developed harmful blood clotting in the past, including those who have had a stroke or a pulmonary embolism, or thrombosis in their legs (deep vein thrombosis [DVT]).  People with an existing blood clot, such as a pulmonary embolism. WARFARIN DOSING Warfarin tablets come in different strengths. Each tablet strength is a different color, with the amount of warfarin (in milligrams) clearly printed on the tablet. If the color of your tablet is different than usual when you receive a new prescription, report it immediately to your pharmacist or health care provider. WARFARIN MONITORING The goal of warfarin therapy is to lessen the clotting tendency of  blood but not prevent clotting completely. Your health care provider will monitor the anticoagulation effect of warfarin closely and adjust your dose as needed. For your safety, blood tests called prothrombin time (PT) or international normalized ratio (INR) are used to measure the effects of warfarin. Both of these tests can be done with a finger stick or a blood draw. The longer it takes the blood to clot, the higher the PT or INR. Your health care provider will inform you of your "target" PT or INR range. If, at any time, your PT or INR is above the target range, there is a risk of bleeding. If your PT or INR is below the target range, there is a risk of clotting. Whether you are started on warfarin while you are in the hospital or in your health care provider's office, you will need to have your PT or INR checked within one week of starting the medicine. Initially, some people are asked to have their PT or INR checked as much as twice a week. Once you are on a stable maintenance dose, the PT or INR is checked  less often, usually once every 2 to 4 weeks. The warfarin dose may be adjusted if the PT or INR is not within the target range. It is important to keep all laboratory and health care provider follow-up appointments. Not keeping appointments could result in a chronic or permanent injury, pain, or disability because warfarin is a medicine that requires close monitoring. WHAT ARE THE SIDE EFFECTS OF WARFARIN?  Too much warfarin can cause bleeding (hemorrhage) from any part of the body. This may include bleeding from the gums, blood in the urine, bloody or dark stools, a nosebleed that is not easily stopped, coughing up blood, or vomiting blood.  Too little warfarin can increase the risk of blood clots.  Too little or too much warfarin can also increase the risk of a stroke.  Warfarin use may cause a skin rash or irritation, an unusual fever, continual nausea or stomach upset, or severe pain in your joints or back. SPECIAL PRECAUTIONS WHILE TAKING WARFARIN Warfarin should be taken exactly as directed. It is very important to take warfarin as directed since bleeding or blood clots could result in chronic or permanent injury, pain, or disability.  Take your medicine at the same time every day. If you forget to take your dose, you can take it if it is within 6 hours of when it was due.  Do not change the dose of warfarin on your own to make up for missed or extra doses.  If you miss more than 2 doses in a row, you should contact your health care provider for advice. Avoid situations that cause bleeding. You may have a tendency to bleed more easily than usual while taking warfarin. The following actions can limit bleeding:  Using a softer toothbrush.  Flossing with waxed floss rather than unwaxed floss.  Shaving with an Copy rather than a blade.  Limiting the use of sharp objects.  Avoiding potentially harmful activities, such as contact sports. Warfarin and Pregnancy or  Breastfeeding  Warfarin is not advised during the first trimester of pregnancy due to an increased risk of birth defects. In certain situations, a woman may take warfarin after her first trimester of pregnancy. A woman who becomes pregnant or plans to become pregnant while taking warfarin should notify her health care provider immediately.  Although warfarin does not pass into breast milk, a woman who wishes  to breastfeed while taking warfarin should also consult with her health care provider. Alcohol, Smoking, and Illicit Drug Use  Alcohol affects how warfarin works in the body. It is best to avoid alcoholic drinks or consume very small amounts while taking warfarin. In general, alcohol intake should be limited to 1 oz (30 mL) of liquor, 6 oz (180 mL) of wine, or 12 oz (360 mL) of beer each day. Notify your health care provider if you change your alcohol intake.  Smoking affects how warfarin works. It is best to avoid smoking while taking warfarin. Notify your health care provider if you change your smoking habits.  It is best to avoid all illicit drugs while taking warfarin since there are few studies that show how warfarin interacts with these drugs. Other Medicines and Dietary Supplements Many prescription and over-the-counter medicines can interfere with warfarin. Be sure all of your health care providers know you are taking warfarin. Notify your health care provider who prescribed warfarin for you or your pharmacist before starting or stopping any new medicines, including over-the-counter vitamins, dietary supplements, and pain medicines. Your warfarin dose may need to be adjusted. Some common over-the-counter medicines that may increase the risk of bleeding while taking warfarin include:   Acetaminophen.  Aspirin.  Nonsteroidal anti-inflammatory medicines (NSAIDs), such as ibuprofen or naproxen.  Vitamin E. Dietary Considerations  Foods that have moderate or high amounts of vitamin K  can interfere with warfarin. Avoid major changes in your diet or notify your health care provider before changing your diet. Eat a consistent amount of foods that have moderate or high amounts of vitamin K. Eating less foods containing vitamin K can increase the risk of bleeding. Eating more foods containing vitamin K can increase the risk of blood clots. Additional questions about dietary considerations can be discussed with a dietitian. Foods that are very high in vitamin K:  Greens, such as Swiss chard and beet, collard, mustard, or turnip greens (fresh or frozen, cooked).  Kale (fresh or frozen, cooked).  Parsley (raw).  Spinach (cooked). Foods that are high in vitamin K:  Asparagus (frozen, cooked).  Beans, green (frozen, cooked).  Broccoli.  Bok choy (cooked).  Brussels sprouts (fresh or frozen, cooked).  Cabbage (cooked).   Coleslaw. Foods that are moderately high in vitamin K:  Blueberries.  Black-eyed peas.  Endive (raw).  Green leaf lettuce (raw).  Green scallions (raw).  Kale (raw).  Okra (frozen, cooked).  Plantains (fried).  Romaine lettuce (raw).  Sauerkraut (canned).  Spinach (raw). CALL YOUR CLINIC OR HEALTH CARE PROVIDER IF YOU:  Plan to have any surgery or procedure.  Feel sick, especially if you have diarrhea or vomiting.  Experience or anticipate any major changes in your diet.  Start or stop a prescription or over-the-counter medicine.  Become, plan to become, or think you may be pregnant.  Are having heavier than usual menstrual periods.  Have had a fall, accident, or any symptoms of bleeding or unusual bruising.  Develop an unusual fever. CALL 911 IN THE [place] OR GO TO THE EMERGENCY DEPARTMENT IF YOU:   Think you may be having an allergic reaction to warfarin. The signs of an allergic reaction could include itching, rash, hives, swelling, chest tightness, or trouble breathing.  See signs of blood in your urine. The signs could  include reddish, pinkish, or tea-colored urine.  See signs of blood in your stools. The signs could include bright red or black stools.  Vomit or cough up blood. In  these instances, the blood could have either a bright red or a "coffee-grounds" appearance.  Have bleeding that will not stop after applying pressure for 30 minutes such as cuts, nosebleeds, or other injuries.  Have severe pain in your joints or back.  Have a new and severe headache.  Have sudden weakness or numbness of your face, arm, or leg, especially on one side of your body.  Have sudden confusion or trouble understanding.  Have sudden trouble seeing in one or both eyes.  Have sudden trouble walking, dizziness, loss of balance, or coordination.  Have trouble speaking or understanding (aphasia). Document Released: 09/13/2005 Document Revised: 01/28/2014 Document Reviewed: 03/09/2013 [facility] Patient Information 2015 [place], [place]. This information is not intended to replace advice given to you by your health care provider. Make sure you discuss any questions you have with your health care provider.

## 2014-09-14 DIAGNOSIS — Z951 Presence of aortocoronary bypass graft: Secondary | ICD-10-CM | POA: Insufficient documentation

## 2014-09-14 DIAGNOSIS — R0602 Shortness of breath: Secondary | ICD-10-CM

## 2014-09-14 LAB — PROTIME-INR
INR: 1.17 (ref 0.00–1.49)
Prothrombin Time: 15 seconds (ref 11.6–15.2)

## 2014-09-14 LAB — BASIC METABOLIC PANEL
ANION GAP: 11 (ref 5–15)
BUN: 12 mg/dL (ref 6–23)
CALCIUM: 8.4 mg/dL (ref 8.4–10.5)
CO2: 24 mEq/L (ref 19–32)
Chloride: 102 mEq/L (ref 96–112)
Creatinine, Ser: 0.69 mg/dL (ref 0.50–1.35)
GFR calc Af Amer: >90 mL/min (ref >90)
Glucose, Bld: 98 mg/dL (ref 70–99)
Potassium: 4.1 mEq/L (ref 3.7–5.3)
Sodium: 137 mEq/L (ref 137–147)

## 2014-09-14 MED ORDER — METOPROLOL TARTRATE 25 MG PO TABS
37.5000 mg | ORAL_TABLET | Freq: Two times a day (BID) | ORAL | Status: DC
  Administered 2014-09-14: 37.5 mg via ORAL
  Filled 2014-09-14 (×4): qty 1

## 2014-09-14 MED ORDER — DILTIAZEM HCL ER COATED BEADS 180 MG PO CP24
180.0000 mg | ORAL_CAPSULE | Freq: Every day | ORAL | Status: DC
  Administered 2014-09-15: 180 mg via ORAL
  Filled 2014-09-14: qty 1

## 2014-09-14 MED ORDER — DILTIAZEM HCL ER 60 MG PO CP12
60.0000 mg | ORAL_CAPSULE | Freq: Once | ORAL | Status: AC
  Administered 2014-09-14: 60 mg via ORAL
  Filled 2014-09-14: qty 1

## 2014-09-14 MED ORDER — DILTIAZEM HCL ER COATED BEADS 120 MG PO CP24: 60.0000 mg | ORAL_CAPSULE | Freq: Once | ORAL | Status: DC

## 2014-09-14 NOTE — Evaluation (Signed)
Physical Therapy Evaluation Patient Details Name: [name] MRN: [id number] DOB: 27-Sep-1946 Today's Date: 09/14/2014   History of Present Illness  68 y.o. male s/p CABG, history of stroke.  Clinical Impression  Patient is seen following the above procedure and presents with functional limitations due to the deficits listed below (see PT Problem List). Ambulates generally well up to 300 feet today without an assistive device. HR up to 120 from 102 bpm at rest. Tolerated therapeutic exercises well. Reviewed sternal precautions and safety with mobility. Patient will benefit from skilled PT to increase their independence and safety with mobility to allow discharge to the venue listed below.       Follow Up Recommendations Home health PT    Equipment Recommendations  None recommended by PT    Recommendations for Other Services OT consult     Precautions / Restrictions Precautions Precautions: Sternal Precaution Comments: Provided handout and reviewed Restrictions Weight Bearing Restrictions: No      Mobility  Bed Mobility Overal bed mobility: Needs Assistance Bed Mobility: Rolling;Sidelying to Sit;Sit to Sidelying Rolling: Supervision Sidelying to sit: Supervision     Sit to sidelying: Supervision General bed mobility comments: Supervision for safety. Educated on log roll technique. Did not require physical assist.Cues for technique throughout  Transfers Overall transfer level: Needs assistance Equipment used: None Transfers: Sit to/from Stand Sit to Stand: Supervision         General transfer comment: Supervision for safety. performed from lowest bed setting. Verbalizes and demonstrates understanding of technique to safely maintain sternal precautions  Ambulation/Gait Ambulation/Gait assistance: Supervision Ambulation Distance (Feet): 300 Feet Assistive device: None Gait Pattern/deviations: Step-through pattern;Decreased stride length;Decreased dorsiflexion -  right;Wide base of support;Drifts right/left Gait velocity: decreased   General Gait Details: Mild sway and drift noted, along with mild foot drop on right. No balance loss or need for physical assist. Pt declines use of cane at this time. HR up to 120 during ambulatory bout. Educated on energy conservation techniques and awareness while ambulating.  Stairs            Wheelchair Mobility    Modified Rankin (Stroke Patients Only)       Balance Overall balance assessment: Needs assistance Sitting-balance support: No upper extremity supported;Feet supported Sitting balance-Leahy Scale: Normal     Standing balance support: No upper extremity supported Standing balance-Leahy Scale: Good                               Pertinent Vitals/Pain Pain Assessment: No/denies pain    Home Living Family/patient expects to be discharged to:: Private residence Living Arrangements: Spouse/significant other Available Help at Discharge: Family;Available 24 hours/day Type of Home: House Home Access: Stairs to enter Entrance Stairs-Rails: Right Entrance Stairs-Number of Steps: 2 Home Layout: Two level;Able to live on main level with bedroom/bathroom Home Equipment: Kasandra Knudsen - single point      Prior Function Level of Independence: Independent               Hand Dominance   Dominant Hand:  (both)    Extremity/Trunk Assessment   Upper Extremity Assessment: Defer to OT evaluation           Lower Extremity Assessment: RLE deficits/detail RLE Deficits / Details: Hx of stroke with mild residual weakness.       Communication   Communication: No difficulties  Cognition Arousal/Alertness: Awake/alert Behavior During Therapy: WFL for tasks assessed/performed Overall Cognitive Status: Within  Functional Limits for tasks assessed                      General Comments General comments (skin integrity, edema, etc.): Time spent discussing safe mobility, home  environment and d/c planning with possible needs at d/c    Exercises General Exercises - Lower Extremity Ankle Circles/Pumps: AROM;Both;10 reps;Seated Long Arc Quad: Strengthening;Both;10 reps;Seated Hip Flexion/Marching: Strengthening;Both;10 reps;Seated      Assessment/Plan    PT Assessment Patient needs continued PT services  PT Diagnosis Abnormality of gait   PT Problem List Decreased strength;Decreased range of motion;Decreased activity tolerance;Decreased balance;Decreased mobility;Decreased coordination;Decreased knowledge of use of DME;Decreased knowledge of precautions;Cardiopulmonary status limiting activity  PT Treatment Interventions DME instruction;Gait training;Stair training;Functional mobility training;Therapeutic activities;Therapeutic exercise;Balance training;Neuromuscular re-education;Patient/family education   PT Goals (Current goals can be found in the Care Plan section) Acute Rehab PT Goals Patient Stated Goal: go home PT Goal Formulation: With patient Time For Goal Achievement: 09/28/14 Potential to Achieve Goals: Good    Frequency Min 3X/week   Barriers to discharge        Co-evaluation               End of Session Equipment Utilized During Treatment: Gait belt Activity Tolerance: Patient tolerated treatment well Patient left: in chair;with call bell/phone within reach Nurse Communication: Mobility status         Time: 1115-5208 PT Time Calculation (min) (ACUTE ONLY): 33 min   Charges:   PT Evaluation $Initial PT Evaluation Tier I: 1 Procedure PT Treatments $Gait Training: 8-22 mins $Self Care/Home Management: 8-22   PT G Codes:          Ellouise Newer 09/14/2014, 11:05 AM Elayne Snare, [place]

## 2014-09-14 NOTE — Progress Notes (Signed)
TELEMETRY: Reviewed telemetry pt in NSR. Still having episodes of AFib with RVR, 2 today lasting 20-30 minutes.  Filed Vitals:   09/13/14 0555 09/13/14 2022 09/13/14 2123 09/14/14 0426  BP: 110/71 138/78 126/73 127/74  Pulse: 86 102 101 88  Temp: 98.2 F (36.8 C) 99.2 F (37.3 C)  97.9 F (36.6 C)  TempSrc: Oral Oral  Oral  Resp: 18 17  19   Height:      Weight: 187 lb 8 oz (85.049 kg)   187 lb 6.4 oz (85.004 kg)  SpO2: 94% 97%  95%    Intake/Output Summary (Last 24 hours) at 09/14/14 1008 Last data filed at 09/14/14 0933  Gross per 24 hour  Intake    480 ml  Output    550 ml  Net    -70 ml   Filed Weights   09/12/14 0453 09/13/14 0555 09/14/14 0426  Weight: 186 lb 15.2 oz (84.8 kg) 187 lb 8 oz (85.049 kg) 187 lb 6.4 oz (85.004 kg)   Subjective Feels much better. Ambulating well.  Marland Kitchen antiseptic oral rinse  7 mL Mouth Rinse QID  . aspirin EC  81 mg Oral Daily  . atorvastatin  20 mg Oral Daily  . diltiazem  120 mg Oral Daily  . metoprolol tartrate  37.5 mg Oral BID  . pantoprazole  40 mg Oral Daily  . sodium chloride  10-40 mL Intracatheter Q12H  . sodium chloride  3 mL Intravenous Q12H  . sodium chloride  3 mL Intravenous Q12H  . warfarin  2.5 mg Oral q1800  . Warfarin - Physician Dosing Inpatient   Does not apply q1800   LABS: Basic Metabolic Panel:  Recent Labs  09/14/14 0252  NA 137  K 4.1  CL 102  CO2 24  GLUCOSE 98  BUN 12  CREATININE 0.69  CALCIUM 8.4   Radiology/Studies:  CHEST 2 VIEW  COMPARISON: 09/08/2014  FINDINGS: The right jugular central venous catheter has been removed. Median sternotomy wires with post CABG changes. Epicardial pacer wires are present. Small bilateral pleural effusions with basilar atelectasis. Negative for a pneumothorax. Heart size is mildly enlarged.  IMPRESSION: Bibasilar chest densities are most compatible with small pleural effusions and atelectasis.   Electronically Signed By: Markus Daft M.D. On:  09/09/2014 07:53  PHYSICAL EXAM General: Well developed, well nourished, in no acute distress. Head: Normocephalic, atraumatic, sclera non-icteric, oropharynx is clear Neck: Negative for carotid bruits. JVD not elevated. No adenopathy Lungs: Clear bilaterally to auscultation without wheezes, rales, or rhonchi. Breathing is unlabored. Heart: RRR S1 S2 without murmurs, rubs, or gallops.  Abdomen: Soft, non-tender, non-distended with normoactive bowel sounds.  Extremities: No clubbing, cyanosis or edema.  Distal pedal pulses are 2+ and equal bilaterally. Neuro: Alert and oriented X 3. Moves all extremities spontaneously. Psych:  Responds to questions appropriately with a normal affect.    ASSESSMENT AND PLAN:  1. Atrial fibrillation: In NSR most of the time, 2 episodes lasting 20-30 minutes this am. Changed to oral Cardizem 120 mg po daily, I would increase to 180 PO CO daily. Continue metoprolol. BP a little soft. If afib recurs may need to consider short term of amiodarone until he has recovered post op. CHADS-Vasc score is high. Starting Coumadin now. Plavix stopped.   On ASA 81 mg daily. Started on Warfarin.   2. S/p CABG, stable - no chest pain or SOB, continues walking with PT.   Corliss Blacker, [place] 09/14/2014 10:08 AM

## 2014-09-14 NOTE — Progress Notes (Signed)
dc'ed pacing wires pt. tolerated well 

## 2014-09-14 NOTE — Progress Notes (Addendum)
      [address]       [place] [address]             [phone]        8 Days Post-Op Procedure(s) (LRB): CORONARY ARTERY BYPASS GRAFTING (CABG) (N/A) TRANSESOPHAGEAL ECHOCARDIOGRAM (TEE) (N/A)  Subjective: Patient sitting up in bed. PT about to work with him.Hopes to go home tomorrow.  Objective: Vital signs in last 24 hours: Temp:  [97.9 F (36.6 C)-99.2 F (37.3 C)] 97.9 F (36.6 C) (12/19 0426) Pulse Rate:  [88-102] 88 (12/19 0426) Cardiac Rhythm:  [-] Normal sinus rhythm;Sinus tachycardia (12/18 2015) Resp:  [17-19] 19 (12/19 0426) BP: (126-138)/(73-78) 127/74 mmHg (12/19 0426) SpO2:  [95 %-97 %] 95 % (12/19 0426) Weight:  [187 lb 6.4 oz (85.004 kg)] 187 lb 6.4 oz (85.004 kg) (12/19 0426)  Pre op weight 90. 3kg Current Weight  09/14/14 187 lb 6.4 oz (85.004 kg)      Intake/Output from previous day: 12/18 0701 - 12/19 0700 In: 480 [P.O.:480] Out: 750 [Urine:750]   Physical Exam:  Cardiovascular: Tachycardic Pulmonary:Mostly clear; no rales, wheezes, or rhonchi. Abdomen: Soft, non tender, bowel sounds present. Extremities: Trace bilateral lower extremity edema. Wounds: LLE wounds are clean and dry.  No erythema or signs of infection. Ecchymosis left thigh. Sternal wound is clean and dry.  Lab Results: CBC: No results for input(s): WBC, HGB, HCT, PLT in the last 72 hours. BMET:   Recent Labs  09/14/14 0252  NA 137  K 4.1  CL 102  CO2 24  GLUCOSE 98  BUN 12  CREATININE 0.69  CALCIUM 8.4    PT/INR:  Lab Results  Component Value Date   INR 1.17 09/14/2014   INR 1.69* 09/06/2014   INR 1.20 09/05/2014   ABG:  INR: Will add last result for INR, ABG once components are confirmed Will add last 4 CBG results once components are confirmed  Assessment/Plan:  1. CV - PAF with RVR. ST at 110 at time of exam.On Cardizem CD 120 daily,Lopressor 25 bid daily, and Coumadin. INR  1.17. Will increase Lopressor to 37.5 bid. 2.   Pulmonary - Encourage incentive spirometer. 4.  Acute blood loss anemia - Last H and H up to 11 and 33.6 5. Remove EPW  6. Possible home in am if HR remains controlled.  ZIMMERMAN,DONIELLE MPA-C 09/14/2014,9:35 AM     patient seen and examined, agree with above He feels well Hopefully home in AM

## 2014-09-15 LAB — PROTIME-INR
INR: 1.18 (ref 0.00–1.49)
Prothrombin Time: 15.1 seconds (ref 11.6–15.2)

## 2014-09-15 MED ORDER — METOPROLOL TARTRATE 25 MG PO TABS
37.5000 mg | ORAL_TABLET | Freq: Two times a day (BID) | ORAL | Status: DC
  Administered 2014-09-15: 37.5 mg via ORAL
  Filled 2014-09-15 (×2): qty 1

## 2014-09-15 MED ORDER — WARFARIN SODIUM 5 MG PO TABS: 5.0000 mg | ORAL_TABLET | Freq: Every day | ORAL | Status: DC

## 2014-09-15 MED ORDER — TRAMADOL HCL 50 MG PO TABS: 50.0000 mg | ORAL_TABLET | ORAL | Status: DC | PRN

## 2014-09-15 MED ORDER — WARFARIN SODIUM 2.5 MG PO TABS: 2.5000 mg | ORAL_TABLET | Freq: Every day | ORAL | Status: DC

## 2014-09-15 MED ORDER — DILTIAZEM HCL ER COATED BEADS 180 MG PO CP24: 180.0000 mg | ORAL_CAPSULE | Freq: Every day | ORAL | Status: DC

## 2014-09-15 MED ORDER — METOPROLOL TARTRATE 25 MG PO TABS: 37.5000 mg | ORAL_TABLET | Freq: Two times a day (BID) | ORAL | Status: DC

## 2014-09-15 MED ORDER — WARFARIN SODIUM 5 MG PO TABS
5.0000 mg | ORAL_TABLET | Freq: Every day | ORAL | Status: DC
  Filled 2014-09-15: qty 1

## 2014-09-15 NOTE — Progress Notes (Addendum)
TELEMETRY: Reviewed telemetry pt in NSR. No complains, wants to go home.  Filed Vitals:   09/14/14 1400 09/14/14 1554 09/14/14 2008 09/15/14 0536  BP: 127/80 125/76 135/75 120/86  Pulse: 91 98 105 101  Temp:  98.2 F (36.8 C) 98 F (36.7 C) 97.9 F (36.6 C)  TempSrc:  Oral Oral Oral  Resp:  20 18 18   Height:      Weight:    186 lb 1.1 oz (84.4 kg)  SpO2: 94% 99% 99% 98%    Intake/Output Summary (Last 24 hours) at 09/15/14 0731 Last data filed at 09/14/14 0933  Gross per 24 hour  Intake    240 ml  Output      0 ml  Net    240 ml   Filed Weights   09/13/14 0555 09/14/14 0426 09/15/14 0536  Weight: 187 lb 8 oz (85.049 kg) 187 lb 6.4 oz (85.004 kg) 186 lb 1.1 oz (84.4 kg)   Subjective Feels much better. Ambulating well.  Marland Kitchen antiseptic oral rinse  7 mL Mouth Rinse QID  . aspirin EC  81 mg Oral Daily  . atorvastatin  20 mg Oral Daily  . diltiazem  180 mg Oral Daily  . metoprolol tartrate  37.5 mg Oral BID  . pantoprazole  40 mg Oral Daily  . sodium chloride  10-40 mL Intracatheter Q12H  . sodium chloride  3 mL Intravenous Q12H  . sodium chloride  3 mL Intravenous Q12H  . warfarin  2.5 mg Oral q1800  . Warfarin - Physician Dosing Inpatient   Does not apply q1800   LABS: Basic Metabolic Panel:  Recent Labs  09/14/14 0252  NA 137  K 4.1  CL 102  CO2 24  GLUCOSE 98  BUN 12  CREATININE 0.69  CALCIUM 8.4   Radiology/Studies:  CHEST 2 VIEW  COMPARISON: 09/08/2014  FINDINGS: The right jugular central venous catheter has been removed. Median sternotomy wires with post CABG changes. Epicardial pacer wires are present. Small bilateral pleural effusions with basilar atelectasis. Negative for a pneumothorax. Heart size is mildly enlarged.  IMPRESSION: Bibasilar chest densities are most compatible with small pleural effusions and atelectasis.   Electronically Signed By: Markus Daft M.D. On: 09/09/2014 07:53  PHYSICAL EXAM General: Well developed,  well nourished, in no acute distress. Head: Normocephalic, atraumatic, sclera non-icteric, oropharynx is clear Neck: Negative for carotid bruits. JVD not elevated. No adenopathy Lungs: Clear bilaterally to auscultation without wheezes, rales, or rhonchi. Breathing is unlabored. Heart: RRR S1 S2 without murmurs, rubs, or gallops.  Abdomen: Soft, non-tender, non-distended with normoactive bowel sounds.  Extremities: No clubbing, cyanosis, B/L feet edema.  Distal pedal pulses are 2+ and equal bilaterally. Neuro: Alert and oriented X 3. Moves all extremities spontaneously. Psych:  Responds to questions appropriately with a normal affect.    ASSESSMENT AND PLAN:  1. Atrial fibrillation: In NSR in the last 24 hours, Changed to oral Cardizem 180 PO CO daily. Continue metoprolol. BP ok, I would discharge him with this dose. CHADS-Vasc score is high. Started on Coumadin, INR 1.18 today, I dont think it is necessary to bridge. Follow up in the coumadine clinic. We will arrange. Plavix stopped.   On ASA 81 mg daily.   2. S/p CABG, stable - no chest pain or SOB, continues walking with PT. Continue atorvastatin, metoprolol, asa.  3. LE edema - the  Patient doesn't want to be on diuretics, advised to elevate his feet and use compression stockings at home.  He can be discharged today. We will arrange for cardiology follow up.    Corliss Blacker, [place] 09/15/2014 7:31 AM

## 2014-09-15 NOTE — Progress Notes (Signed)
Discharge AVS, meds taken today and those due this evening reviewed.  INR check and coumadin instructions reviewed.  Incision site care and activity restrictions reviewed.  Follow-up appointments and when to call md reviewed.  D/C IV and TELE.  Questions and concerns addressed.   D/C home per orders.

## 2014-09-15 NOTE — Progress Notes (Addendum)
      [address]       [place] [address]             [phone]        9 Days Post-Op Procedure(s) (LRB): CORONARY ARTERY BYPASS GRAFTING (CABG) (N/A) TRANSESOPHAGEAL ECHOCARDIOGRAM (TEE) (N/A)  Subjective: Patient just finished eating breakfast. No complaints and wants to go home.  Objective: Vital signs in last 24 hours: Temp:  [97.9 F (36.6 C)-98.2 F (36.8 C)] 97.9 F (36.6 C) (12/20 0536) Pulse Rate:  [74-105] 101 (12/20 0536) Cardiac Rhythm:  [-] Normal sinus rhythm;Sinus tachycardia (12/19 2000) Resp:  [18-20] 18 (12/20 0536) BP: (98-138)/(55-86) 120/86 mmHg (12/20 0536) SpO2:  [94 %-99 %] 98 % (12/20 0536) Weight:  [186 lb 1.1 oz (84.4 kg)] 186 lb 1.1 oz (84.4 kg) (12/20 0536)  Pre op weight 90. 3kg Current Weight  09/15/14 186 lb 1.1 oz (84.4 kg)      Intake/Output from previous day: 12/19 0701 - 12/20 0700 In: 240 [P.O.:240] Out: -    Physical Exam:  Cardiovascular: Slightly tachycardic Pulmonary:Mostly clear; no rales, wheezes, or rhonchi. Abdomen: Soft, non tender, bowel sounds present. Extremities: Trace bilateral lower extremity edema. Wounds: LLE wounds are clean and dry.  No erythema or signs of infection. Ecchymosis left thigh. Sternal wound is clean and dry.  Lab Results: CBC: No results for input(s): WBC, HGB, HCT, PLT in the last 72 hours. BMET:   Recent Labs  09/14/14 0252  NA 137  K 4.1  CL 102  CO2 24  GLUCOSE 98  BUN 12  CREATININE 0.69  CALCIUM 8.4    PT/INR:  Lab Results  Component Value Date   INR 1.18 09/15/2014   INR 1.17 09/14/2014   INR 1.69* 09/06/2014   ABG:  INR: Will add last result for INR, ABG once components are confirmed Will add last 4 CBG results once components are confirmed  Assessment/Plan:  1. CV - PAF with RVR. SR in the 90's at time of exam.On Cardizem CD 180 daily,Lopressor 37.5 bid daily, and Coumadin. INR  Barely increased-1.18. Increase Coumadin to 5 daily.  2.   Pulmonary - Encourage incentive spirometer. 4.  Acute blood loss anemia - Last H and H up to 11 and 33.6 5. Remove sutures 6. Likely discharge later today  Estha Few MPA-C 09/15/2014,7:49 AM

## 2014-09-16 ENCOUNTER — Telehealth: Payer: Self-pay | Admitting: *Deleted

## 2014-09-16 NOTE — Anesthesia Postprocedure Evaluation (Signed)
  Anesthesia Post-op Note  Patient: [name]  Procedure(s) Performed: Procedure(s) with comments: CORONARY ARTERY BYPASS GRAFTING (CABG) (N/A) - CABG times 4, using internal mammary artery, and right leg saphenous vein harvested endoscopically TRANSESOPHAGEAL ECHOCARDIOGRAM (TEE) (N/A)  Patient discharged with no reported anesthetic complications

## 2014-09-16 NOTE — Telephone Encounter (Signed)
Called pt to get him set-up for a TCM appt. Pt refuse to make appt. He stated thaey he didn't see a reason why he needs to see Dr. Ronnald Ramp. He had issues with his heart and they already made hops f/u with heart Dr. Tharon Aquas trigt. He did states if he need to make appt after see him he will call back to schedule...Johny Chess

## 2014-09-18 ENCOUNTER — Ambulatory Visit (INDEPENDENT_AMBULATORY_CARE_PROVIDER_SITE_OTHER): Payer: Medicare HMO | Admitting: *Deleted

## 2014-09-18 DIAGNOSIS — Z5181 Encounter for therapeutic drug level monitoring: Secondary | ICD-10-CM

## 2014-09-18 DIAGNOSIS — I480 Paroxysmal atrial fibrillation: Secondary | ICD-10-CM | POA: Insufficient documentation

## 2014-09-18 DIAGNOSIS — Z951 Presence of aortocoronary bypass graft: Secondary | ICD-10-CM

## 2014-09-18 DIAGNOSIS — I4891 Unspecified atrial fibrillation: Secondary | ICD-10-CM

## 2014-09-18 LAB — POCT INR: INR: 1.7

## 2014-09-18 NOTE — Patient Instructions (Signed)

## 2014-09-23 ENCOUNTER — Ambulatory Visit (INDEPENDENT_AMBULATORY_CARE_PROVIDER_SITE_OTHER): Payer: Medicare HMO | Admitting: Pharmacist

## 2014-09-23 DIAGNOSIS — Z951 Presence of aortocoronary bypass graft: Secondary | ICD-10-CM

## 2014-09-23 DIAGNOSIS — I4891 Unspecified atrial fibrillation: Secondary | ICD-10-CM

## 2014-09-23 DIAGNOSIS — Z5181 Encounter for therapeutic drug level monitoring: Secondary | ICD-10-CM

## 2014-09-23 LAB — POCT INR: INR: 1.7

## 2014-09-30 ENCOUNTER — Ambulatory Visit (INDEPENDENT_AMBULATORY_CARE_PROVIDER_SITE_OTHER): Payer: Medicare HMO

## 2014-09-30 DIAGNOSIS — Z5181 Encounter for therapeutic drug level monitoring: Secondary | ICD-10-CM

## 2014-09-30 DIAGNOSIS — Z951 Presence of aortocoronary bypass graft: Secondary | ICD-10-CM

## 2014-09-30 DIAGNOSIS — I4891 Unspecified atrial fibrillation: Secondary | ICD-10-CM

## 2014-09-30 LAB — POCT INR: INR: 1.6

## 2014-10-03 ENCOUNTER — Telehealth: Payer: Self-pay | Admitting: *Deleted

## 2014-10-03 ENCOUNTER — Ambulatory Visit (INDEPENDENT_AMBULATORY_CARE_PROVIDER_SITE_OTHER): Payer: Medicare HMO | Admitting: Cardiology

## 2014-10-03 ENCOUNTER — Encounter: Payer: Self-pay | Admitting: Cardiology

## 2014-10-03 VITALS — BP 130/83 | HR 68 | Ht 73.0 in | Wt 188.6 lb

## 2014-10-03 DIAGNOSIS — I4891 Unspecified atrial fibrillation: Secondary | ICD-10-CM

## 2014-10-03 DIAGNOSIS — I639 Cerebral infarction, unspecified: Secondary | ICD-10-CM

## 2014-10-03 DIAGNOSIS — Z951 Presence of aortocoronary bypass graft: Secondary | ICD-10-CM

## 2014-10-03 DIAGNOSIS — I10 Essential (primary) hypertension: Secondary | ICD-10-CM

## 2014-10-03 DIAGNOSIS — E785 Hyperlipidemia, unspecified: Secondary | ICD-10-CM

## 2014-10-03 DIAGNOSIS — Z7901 Long term (current) use of anticoagulants: Secondary | ICD-10-CM | POA: Insufficient documentation

## 2014-10-03 DIAGNOSIS — I480 Paroxysmal atrial fibrillation: Secondary | ICD-10-CM

## 2014-10-03 MED ORDER — METOPROLOL TARTRATE 25 MG PO TABS: 37.5000 mg | ORAL_TABLET | Freq: Two times a day (BID) | ORAL | Status: DC

## 2014-10-03 NOTE — Assessment & Plan Note (Signed)
Controlled.  

## 2014-10-03 NOTE — Progress Notes (Addendum)
10/03/2014 [name]   1946-06-12  [id number]  Primary Physician Scarlette Calico, MD Primary Cardiologist: Dr Mare Ferrari  HPI:  69 year old Caucasian male with a past medical history of HTN, dyslipidemia, remote tobacco abuse, and posterior circulation CVA 4 yrs ago (no obvious cause, no documented AF-Rx'd with ASA/Plavix) who was found to have severe three vessel coronary artery disease with preserved LVF at cath 08/29/14. He underwent CABG x 4 on 09/06/14. He was to be discharged 12/14 but had PAF. He converted spontaneously to NSR with the addition of Diltiazem. He was discharged on ASA, Coumadin, and Plavix. He is sen in the office today for follow up. He has been doing well. He admits to occasional palpitations since discharge.    Current Outpatient Prescriptions  Medication Sig Dispense Refill  . acetaminophen (TYLENOL) 325 MG tablet Take 2 tablets (650 mg total) by mouth every 4 (four) hours as needed for headache or mild pain.    Marland Kitchen aspirin 81 MG tablet Take 81 mg by mouth daily.    Marland Kitchen atorvastatin (LIPITOR) 20 MG tablet Take 1 tablet (20 mg total) by mouth daily. 90 tablet 3  . clopidogrel (PLAVIX) 75 MG tablet Take 75 mg by mouth daily.    Marland Kitchen diltiazem (CARDIZEM CD) 180 MG 24 hr capsule Take 1 capsule (180 mg total) by mouth daily. 30 capsule 1  . FLUZONE HIGH-DOSE 0.5 ML SUSY   0  . metoprolol tartrate (LOPRESSOR) 25 MG tablet Take 1.5 tablets (37.5 mg total) by mouth 2 (two) times daily. 90 tablet 5  . warfarin (COUMADIN) 5 MG tablet Take 1 tablet (5 mg total) by mouth daily at 6 PM. 30 tablet 1  . NITROSTAT 0.4 MG SL tablet Place 1 tablet under the tongue as needed.  2   No current facility-administered medications for this visit.    No Known Allergies  History   Social History  . Marital Status: Married    Spouse Name: N/A    Number of Children: 2  . Years of Education: 12   Occupational History  . retail sales - paint    Social History Main Topics  . Smoking  status: Former Smoker -- 1.00 packs/day for 15 years    Types: Cigarettes    Quit date: 09/27/1978  . Smokeless tobacco: Never Used  . Alcohol Use: 3.6 oz/week    6 Cans of beer per week     Comment: stopped drinking after stroke. Had been drink 6 pk per day plus binge drinking  . Drug Use: No  . Sexual Activity:    Partners: Female   Other Topics Concern  . Not on file   Social History Narrative   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 Married '79. 1 dtr- '68, 1 step son. 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 Marriage in good health. He reports he is very strong in his faith.      Review of Systems: General: negative for chills, fever, night sweats or weight changes.  Cardiovascular: negative for chest pain, dyspnea on exertion, edema, orthopnea, palpitations, paroxysmal nocturnal dyspnea or shortness of breath Dermatological: negative for rash Respiratory: negative for cough or wheezing Urologic: negative for hematuria Abdominal: negative for nausea, vomiting, diarrhea, bright red blood per rectum, melena, or hematemesis Neurologic: negative for visual changes, syncope, or dizziness All other systems reviewed and are otherwise negative except as noted  above.    Blood pressure 130/83, pulse 68, height 6\' 1"  (1.854 m), weight 188 lb 9.6 oz (85.548 kg).  General appearance: alert, cooperative and no distress Lungs: clear to auscultation bilaterally Heart: regular rate and rhythm   EKG NSR, TWI V2-V3  ASSESSMENT AND PLAN:   S/P CABG x 4 09/06/14 L-LAD, S-OM, S-RI, S-RCA. Doing well post op.  PAF- CHADVASC -5 PAF post op, converted spontaneously on Diltiazem before discharge. Holding NSR today  Anticoagulated On Coumadin, ASA, Plavix since since CABG, duration to be determined  Stroke August 2011 x 2: L. cerebellar, lateral medulla, old lacunar infarcts;  2nd left pontine infarct.-? Secondary to unrecognized  PAF  Hypertension Controlled  Hyperlipidemia with target LDL less than 100 On statin Rx.    PLAN  I will review his medications with Dr Mare Ferrari- ? does he need triple therapy.   Shamona Wirtz KPA-C 10/03/2014 9:22 AM

## 2014-10-03 NOTE — Assessment & Plan Note (Signed)
PAF post op, converted spontaneously on Diltiazem before discharge. Holding NSR today

## 2014-10-03 NOTE — Assessment & Plan Note (Signed)
L-LAD, S-OM, S-RI, S-RCA. Doing well post op.

## 2014-10-03 NOTE — Telephone Encounter (Signed)
-----   Message from Erlene Quan, [place] sent at 10/03/2014  1:43 PM EST -----   ----- Message -----    From: Darlin Coco, MD    Sent: 10/03/2014  12:56 PM      To: Erlene Quan, PA-C  I would stop Plavix, continue Coumadin and baby aspirin.  Dr. Francesca Oman note from December 20 in the hospital indicated that the Plavix had been stopped.  However it must have been started again at discharge. TB ----- Message -----    From: Erlene Quan, PA-C    Sent: 10/03/2014   9:15 AM      To: Darlin Coco, MD  Dr Mare Ferrari I saw Mr Lavell in follow up post CABG. He is doing well, holding NSR. He was discharged on ASA, Plavix, and Coumadin, not sure he needs all three. I told them I would discuss it with you and let them know.  Kerin Ransom PA-C 10/03/2014 9:17 AM

## 2014-10-03 NOTE — Telephone Encounter (Signed)
Patient notified to stop the Plavix per Dr. Mare Ferrari and continue Coumadin and ASA.  Patient voiced understanding.

## 2014-10-03 NOTE — Assessment & Plan Note (Signed)
August 2011 x 2: L. cerebellar, lateral medulla, old lacunar infarcts;  2nd left pontine infarct.-? Secondary to unrecognized PAF

## 2014-10-03 NOTE — Telephone Encounter (Signed)
-----   Message from Erlene Quan, [place] sent at 10/03/2014  1:43 PM EST -----   ----- Message -----    From: Darlin Coco, MD    Sent: 10/03/2014  12:56 PM      To: Erlene Quan, PA-C  I would stop Plavix, continue Coumadin and baby aspirin.  Dr. Francesca Oman note from December 20 in the hospital indicated that the Plavix had been stopped.  However it must have been started again at discharge. TB ----- Message -----    From: Erlene Quan, PA-C    Sent: 10/03/2014   9:15 AM      To: Darlin Coco, MD  Dr Mare Ferrari I saw [name] in follow up post CABG. He is doing well, holding NSR. He was discharged on ASA, Plavix, and Coumadin, not sure he needs all three. I told them I would discuss it with you and let them know.  Kerin Ransom PA-C 10/03/2014 9:17 AM

## 2014-10-03 NOTE — Patient Instructions (Signed)
Your physician recommends that you schedule a follow-up appointment in: 2 months with Dr. Mare Ferrari.

## 2014-10-03 NOTE — Assessment & Plan Note (Signed)
On statin Rx 

## 2014-10-03 NOTE — Assessment & Plan Note (Addendum)
On Coumadin, ASA, Plavix since since CABG, duration to be determined

## 2014-10-07 ENCOUNTER — Ambulatory Visit (INDEPENDENT_AMBULATORY_CARE_PROVIDER_SITE_OTHER): Payer: Commercial Managed Care - HMO

## 2014-10-07 DIAGNOSIS — Z951 Presence of aortocoronary bypass graft: Secondary | ICD-10-CM | POA: Diagnosis not present

## 2014-10-07 DIAGNOSIS — Z5181 Encounter for therapeutic drug level monitoring: Secondary | ICD-10-CM | POA: Diagnosis not present

## 2014-10-07 DIAGNOSIS — I480 Paroxysmal atrial fibrillation: Secondary | ICD-10-CM

## 2014-10-07 DIAGNOSIS — I4891 Unspecified atrial fibrillation: Secondary | ICD-10-CM

## 2014-10-07 LAB — POCT INR: INR: 1.8

## 2014-10-08 ENCOUNTER — Other Ambulatory Visit: Payer: Self-pay | Admitting: Cardiothoracic Surgery

## 2014-10-08 DIAGNOSIS — Z951 Presence of aortocoronary bypass graft: Secondary | ICD-10-CM

## 2014-10-09 ENCOUNTER — Ambulatory Visit
Admission: RE | Admit: 2014-10-09 | Discharge: 2014-10-09 | Disposition: A | Discharge status: Home / Self Care | Payer: Medicare HMO | Source: Ambulatory Visit | Attending: Cardiothoracic Surgery | Admitting: Cardiothoracic Surgery

## 2014-10-09 ENCOUNTER — Encounter: Payer: Self-pay | Admitting: Cardiothoracic Surgery

## 2014-10-09 ENCOUNTER — Ambulatory Visit (INDEPENDENT_AMBULATORY_CARE_PROVIDER_SITE_OTHER): Payer: Self-pay | Admitting: Cardiothoracic Surgery

## 2014-10-09 VITALS — BP 121/76 | HR 68 | Resp 20 | Ht 73.0 in | Wt 188.0 lb

## 2014-10-09 DIAGNOSIS — J90 Pleural effusion, not elsewhere classified: Secondary | ICD-10-CM | POA: Diagnosis not present

## 2014-10-09 DIAGNOSIS — Z951 Presence of aortocoronary bypass graft: Secondary | ICD-10-CM | POA: Diagnosis not present

## 2014-10-09 NOTE — Progress Notes (Signed)
PCP is Scarlette Calico, MD Referring Provider is Darlin Coco, MD  Chief Complaint  Patient presents with  . Routine Post Op    F/U from surgery with CXR s/p CABG 09/06/14    HPI:one month followup after multivessel CABG for unstable angina Patient doing well at home without recurrent angina, walking 30-40 minutes daily, incisions healing well  Postop patient had atrial fibrillation which converted to sinus rhythm with metoprolol and Cardizem. He had been on Plavix preoperatively for a previous stroke with cerebrovascular disease. He is discharged home on Coumadin, Plavix and aspirin. At his cardiology followup appointment last week the Plavix has been discontinued. The patient denies any neurologic symptoms following CABG   Past Medical History  Diagnosis Date  . Alcohol abuse, unspecified   . Hypertension   . Hyperlipidemia     preTx 114, postTx 47 LDL  . H/O renal calculi   . Arthritis     in back and shoulder  . Anginal pain   . Shortness of breath dyspnea     with exertion  . Stroke     August '11 x 2: L. cerebellar, lateral medulla, ol rd lacunar infarcts;  2nd left pontine infarct.  . History of kidney stones     Past Surgical History  Procedure Laterality Date  . Tonsillectomy  1954  . Lumbar disc surgery  1986    Dr. Juanetta Snow  . Tonsillectomy    . Back surgery  1986  . Rotator cuff repair  06/10/12    at [facility]...left shoulder  . Left heart catheterization with coronary angiogram N/A 08/29/2014    Procedure: LEFT HEART CATHETERIZATION WITH CORONARY ANGIOGRAM;  Surgeon: Lorretta Harp, MD;  [facility] C[facility] LAB;  Service: Cardiovascular;  Laterality: N/A;  . Coronary artery bypass graft N/A 09/06/2014    Procedure: CORONARY ARTERY BYPASS GRAFTING (CABG);  Surgeon: Ivin Poot, MD;  Location: [facility];  Service: Open Heart Surgery;  Laterality: N/A;  CABG times 4, using internal mammary artery, and right leg saphenous vein harvested  endoscopically  . Tee without cardioversion N/A 09/06/2014    Procedure: TRANSESOPHAGEAL ECHOCARDIOGRAM (TEE);  Surgeon: Ivin Poot, MD;  Location: [facility];  Service: Open Heart Surgery;  Laterality: N/A;    Family History  Problem Relation Age of Onset  . Heart disease Father   . Hypertension Father   . Cancer Neg Hx   . Diabetes Neg Hx   . Early death Neg Hx   . Hearing loss Neg Hx   . Hyperlipidemia Neg Hx   . Kidney disease Neg Hx   . Stroke Neg Hx   . Alcohol abuse Neg Hx     Social History History  Substance Use Topics  . Smoking status: Former Smoker -- 1.00 packs/day for 15 years    Types: Cigarettes    Quit date: 09/27/1978  . Smokeless tobacco: Never Used  . Alcohol Use: 3.6 oz/week    6 Cans of beer per week     Comment: stopped drinking after stroke. Had been drink 6 pk per day plus binge drinking    Current Outpatient Prescriptions  Medication Sig Dispense Refill  . acetaminophen (TYLENOL) 325 MG tablet Take 2 tablets (650 mg total) by mouth every 4 (four) hours as needed for headache or mild pain.    Marland Kitchen aspirin 81 MG tablet Take 81 mg by mouth daily.    Marland Kitchen atorvastatin (LIPITOR) 20 MG tablet Take 1 tablet (20 mg total) by  mouth daily. 90 tablet 3  . diltiazem (CARDIZEM CD) 180 MG 24 hr capsule Take 1 capsule (180 mg total) by mouth daily. 30 capsule 1  . metoprolol tartrate (LOPRESSOR) 25 MG tablet Take 1.5 tablets (37.5 mg total) by mouth 2 (two) times daily. 90 tablet 5  . NITROSTAT 0.4 MG SL tablet Place 1 tablet under the tongue as needed.  2  . warfarin (COUMADIN) 5 MG tablet Take 1 tablet (5 mg total) by mouth daily at 6 PM. 30 tablet 1   No current facility-administered medications for this visit.    No Known Allergies  Review of Systems  The patient remains on both Cardizem and Lopressor for his postoperative atrial fibrillation. The patient was not responsive to amiodarone for treatment of his postop atrial fib. The patient's appetite and  overall strength are improving. The patient plans on starting outpatient cardiac rehabilitation now that he can drive  BP 144/31 mmHg  Pulse 68  Resp 20  Ht 6\' 1"  (1.854 m)  Wt 188 lb (85.276 kg)  BMI 24.81 kg/m2  SpO2 99% Physical Exam Alert and comfortable Lungs clear Sternal incision well-healed Heart rhythm regular without rub or gallop Leg incision is well-healed, minimal left ankle edema  Diagnostic Tests: Chest x-rays clear status post CABG  Impression: Excellent early recovery after multivessel CABG for unstable angina The patient cannot drive and lift up to 20 pounds and he is ready to start outpatient cardiac rehabilitation He'll continue his current medications under the direction of his cardiologist Dr. Mare Ferrari who will decide how long the patient needs Coumadin and how long he needs both Cardizem and metoprolol.  Plan:start rehabilitation at the hospital, weight  maximumor lifting is 20 pounds until mid-March.

## 2014-10-10 ENCOUNTER — Encounter (HOSPITAL_COMMUNITY): Payer: Self-pay | Admitting: Interventional Cardiology

## 2014-10-14 ENCOUNTER — Ambulatory Visit (INDEPENDENT_AMBULATORY_CARE_PROVIDER_SITE_OTHER): Payer: Commercial Managed Care - HMO

## 2014-10-14 DIAGNOSIS — Z951 Presence of aortocoronary bypass graft: Secondary | ICD-10-CM | POA: Diagnosis not present

## 2014-10-14 DIAGNOSIS — I480 Paroxysmal atrial fibrillation: Secondary | ICD-10-CM

## 2014-10-14 DIAGNOSIS — Z5181 Encounter for therapeutic drug level monitoring: Secondary | ICD-10-CM | POA: Diagnosis not present

## 2014-10-14 DIAGNOSIS — I4891 Unspecified atrial fibrillation: Secondary | ICD-10-CM | POA: Diagnosis not present

## 2014-10-14 LAB — POCT INR: INR: 2.1

## 2014-10-21 ENCOUNTER — Ambulatory Visit (INDEPENDENT_AMBULATORY_CARE_PROVIDER_SITE_OTHER): Payer: Medicare HMO

## 2014-10-21 DIAGNOSIS — Z951 Presence of aortocoronary bypass graft: Secondary | ICD-10-CM

## 2014-10-21 DIAGNOSIS — Z5181 Encounter for therapeutic drug level monitoring: Secondary | ICD-10-CM | POA: Diagnosis not present

## 2014-10-21 DIAGNOSIS — I480 Paroxysmal atrial fibrillation: Secondary | ICD-10-CM | POA: Diagnosis not present

## 2014-10-21 DIAGNOSIS — I4891 Unspecified atrial fibrillation: Secondary | ICD-10-CM

## 2014-10-21 LAB — POCT INR: INR: 2.1

## 2014-10-23 ENCOUNTER — Encounter: Payer: Medicare HMO | Admitting: Cardiothoracic Surgery

## 2014-10-28 ENCOUNTER — Ambulatory Visit (INDEPENDENT_AMBULATORY_CARE_PROVIDER_SITE_OTHER): Payer: Medicare HMO

## 2014-10-28 DIAGNOSIS — Z5181 Encounter for therapeutic drug level monitoring: Secondary | ICD-10-CM | POA: Diagnosis not present

## 2014-10-28 DIAGNOSIS — I480 Paroxysmal atrial fibrillation: Secondary | ICD-10-CM

## 2014-10-28 DIAGNOSIS — I4891 Unspecified atrial fibrillation: Secondary | ICD-10-CM

## 2014-10-28 DIAGNOSIS — Z951 Presence of aortocoronary bypass graft: Secondary | ICD-10-CM

## 2014-10-28 LAB — POCT INR: INR: 2.7

## 2014-11-05 ENCOUNTER — Telehealth: Payer: Self-pay | Admitting: Cardiology

## 2014-11-05 NOTE — Telephone Encounter (Signed)
Cardiac rehab refaxed to rehab, confirmation printed

## 2014-11-05 NOTE — Telephone Encounter (Signed)
New Prob    Pt has some questions regarding cardiac rehab and when he can start. Please call.

## 2014-11-07 ENCOUNTER — Encounter (HOSPITAL_COMMUNITY)
Admission: RE | Admit: 2014-11-07 | Discharge: 2014-11-07 | Disposition: A | Discharge status: Home / Self Care | Payer: Commercial Managed Care - HMO | Source: Ambulatory Visit | Attending: Cardiology | Admitting: Cardiology

## 2014-11-07 DIAGNOSIS — R112 Nausea with vomiting, unspecified: Secondary | ICD-10-CM | POA: Insufficient documentation

## 2014-11-07 DIAGNOSIS — Z87891 Personal history of nicotine dependence: Secondary | ICD-10-CM | POA: Insufficient documentation

## 2014-11-07 DIAGNOSIS — I480 Paroxysmal atrial fibrillation: Secondary | ICD-10-CM | POA: Insufficient documentation

## 2014-11-07 DIAGNOSIS — Z8673 Personal history of transient ischemic attack (TIA), and cerebral infarction without residual deficits: Secondary | ICD-10-CM | POA: Insufficient documentation

## 2014-11-07 DIAGNOSIS — Z5189 Encounter for other specified aftercare: Secondary | ICD-10-CM | POA: Insufficient documentation

## 2014-11-07 DIAGNOSIS — I2510 Atherosclerotic heart disease of native coronary artery without angina pectoris: Secondary | ICD-10-CM | POA: Insufficient documentation

## 2014-11-07 DIAGNOSIS — N400 Enlarged prostate without lower urinary tract symptoms: Secondary | ICD-10-CM | POA: Insufficient documentation

## 2014-11-07 DIAGNOSIS — Z7901 Long term (current) use of anticoagulants: Secondary | ICD-10-CM | POA: Insufficient documentation

## 2014-11-07 DIAGNOSIS — I10 Essential (primary) hypertension: Secondary | ICD-10-CM | POA: Insufficient documentation

## 2014-11-07 DIAGNOSIS — E871 Hypo-osmolality and hyponatremia: Secondary | ICD-10-CM | POA: Insufficient documentation

## 2014-11-07 DIAGNOSIS — E876 Hypokalemia: Secondary | ICD-10-CM | POA: Insufficient documentation

## 2014-11-07 DIAGNOSIS — Z951 Presence of aortocoronary bypass graft: Secondary | ICD-10-CM | POA: Insufficient documentation

## 2014-11-07 DIAGNOSIS — D62 Acute posthemorrhagic anemia: Secondary | ICD-10-CM | POA: Insufficient documentation

## 2014-11-07 DIAGNOSIS — D696 Thrombocytopenia, unspecified: Secondary | ICD-10-CM | POA: Insufficient documentation

## 2014-11-07 DIAGNOSIS — E785 Hyperlipidemia, unspecified: Secondary | ICD-10-CM | POA: Insufficient documentation

## 2014-11-07 NOTE — Progress Notes (Signed)
Cardiac Rehab Medication Review by a Pharmacist  Does the patient  feel that his/her medications are working for him/her?  yes  Has the patient been experiencing any side effects to the medications prescribed?  no  Does the patient measure his/her own blood pressure or blood glucose at home?  no   Does the patient have any problems obtaining medications due to transportation or finances?   no  Understanding of regimen: good Understanding of indications: good Potential of compliance: excellent   Pharmacist comments: Pt has good understanding of medicines and reports doing his research so he knows what to know about each med.  Pt reports never missing any doses of meds, leaves in pill bottles instead of using pill box.  Counseling and education provided regarding SL NTG, questions answered.  [name], PharmD Clinical Pharmacy Resident Pager: [phone] 11/07/2014 8:47 AM

## 2014-11-11 ENCOUNTER — Ambulatory Visit (INDEPENDENT_AMBULATORY_CARE_PROVIDER_SITE_OTHER): Payer: Medicare HMO | Admitting: Pharmacist

## 2014-11-11 ENCOUNTER — Encounter (HOSPITAL_COMMUNITY)
Admission: RE | Admit: 2014-11-11 | Discharge: 2014-11-11 | Disposition: A | Discharge status: Home / Self Care | Payer: Commercial Managed Care - HMO | Source: Ambulatory Visit | Attending: Cardiology | Admitting: Cardiology

## 2014-11-11 DIAGNOSIS — Z5181 Encounter for therapeutic drug level monitoring: Secondary | ICD-10-CM | POA: Diagnosis not present

## 2014-11-11 DIAGNOSIS — D696 Thrombocytopenia, unspecified: Secondary | ICD-10-CM | POA: Diagnosis not present

## 2014-11-11 DIAGNOSIS — I480 Paroxysmal atrial fibrillation: Secondary | ICD-10-CM | POA: Diagnosis not present

## 2014-11-11 DIAGNOSIS — I2510 Atherosclerotic heart disease of native coronary artery without angina pectoris: Secondary | ICD-10-CM | POA: Diagnosis not present

## 2014-11-11 DIAGNOSIS — I10 Essential (primary) hypertension: Secondary | ICD-10-CM | POA: Diagnosis not present

## 2014-11-11 DIAGNOSIS — E785 Hyperlipidemia, unspecified: Secondary | ICD-10-CM | POA: Diagnosis not present

## 2014-11-11 DIAGNOSIS — Z951 Presence of aortocoronary bypass graft: Secondary | ICD-10-CM | POA: Diagnosis not present

## 2014-11-11 DIAGNOSIS — I4891 Unspecified atrial fibrillation: Secondary | ICD-10-CM | POA: Diagnosis not present

## 2014-11-11 DIAGNOSIS — N400 Enlarged prostate without lower urinary tract symptoms: Secondary | ICD-10-CM | POA: Diagnosis not present

## 2014-11-11 DIAGNOSIS — R112 Nausea with vomiting, unspecified: Secondary | ICD-10-CM | POA: Diagnosis not present

## 2014-11-11 DIAGNOSIS — Z8673 Personal history of transient ischemic attack (TIA), and cerebral infarction without residual deficits: Secondary | ICD-10-CM | POA: Diagnosis not present

## 2014-11-11 DIAGNOSIS — Z5189 Encounter for other specified aftercare: Secondary | ICD-10-CM | POA: Diagnosis not present

## 2014-11-11 DIAGNOSIS — Z7901 Long term (current) use of anticoagulants: Secondary | ICD-10-CM | POA: Diagnosis not present

## 2014-11-11 DIAGNOSIS — E871 Hypo-osmolality and hyponatremia: Secondary | ICD-10-CM | POA: Diagnosis not present

## 2014-11-11 DIAGNOSIS — D62 Acute posthemorrhagic anemia: Secondary | ICD-10-CM | POA: Diagnosis not present

## 2014-11-11 DIAGNOSIS — E876 Hypokalemia: Secondary | ICD-10-CM | POA: Diagnosis not present

## 2014-11-11 DIAGNOSIS — Z87891 Personal history of nicotine dependence: Secondary | ICD-10-CM | POA: Diagnosis not present

## 2014-11-11 LAB — POCT INR: INR: 2

## 2014-11-11 NOTE — Progress Notes (Signed)
Pt started cardiac rehab today.  Pt tolerated light exercise without difficulty. Telemetry rhythm Sinus. PHQ =0. Gerik's short  term goals are to be healthier, increase activity and strengthen his heart. [place] long term goals are to have confidence to do yard work. Will continue to monitor the patient throughout  the program.

## 2014-11-12 ENCOUNTER — Other Ambulatory Visit: Payer: Self-pay | Admitting: Physician Assistant

## 2014-11-13 ENCOUNTER — Encounter (HOSPITAL_COMMUNITY)
Admission: RE | Admit: 2014-11-13 | Discharge: 2014-11-13 | Disposition: A | Discharge status: Home / Self Care | Payer: Commercial Managed Care - HMO | Source: Ambulatory Visit | Attending: Cardiology | Admitting: Cardiology

## 2014-11-13 DIAGNOSIS — R112 Nausea with vomiting, unspecified: Secondary | ICD-10-CM | POA: Diagnosis not present

## 2014-11-13 DIAGNOSIS — I2510 Atherosclerotic heart disease of native coronary artery without angina pectoris: Secondary | ICD-10-CM | POA: Diagnosis not present

## 2014-11-13 DIAGNOSIS — Z5189 Encounter for other specified aftercare: Secondary | ICD-10-CM | POA: Diagnosis not present

## 2014-11-13 DIAGNOSIS — I480 Paroxysmal atrial fibrillation: Secondary | ICD-10-CM | POA: Diagnosis not present

## 2014-11-13 DIAGNOSIS — D696 Thrombocytopenia, unspecified: Secondary | ICD-10-CM | POA: Diagnosis not present

## 2014-11-13 DIAGNOSIS — Z951 Presence of aortocoronary bypass graft: Secondary | ICD-10-CM | POA: Diagnosis not present

## 2014-11-13 DIAGNOSIS — N400 Enlarged prostate without lower urinary tract symptoms: Secondary | ICD-10-CM | POA: Diagnosis not present

## 2014-11-13 DIAGNOSIS — D62 Acute posthemorrhagic anemia: Secondary | ICD-10-CM | POA: Diagnosis not present

## 2014-11-13 DIAGNOSIS — E871 Hypo-osmolality and hyponatremia: Secondary | ICD-10-CM | POA: Diagnosis not present

## 2014-11-14 ENCOUNTER — Other Ambulatory Visit: Payer: Self-pay | Admitting: Internal Medicine

## 2014-11-14 ENCOUNTER — Other Ambulatory Visit: Payer: Self-pay | Admitting: Physician Assistant

## 2014-11-15 ENCOUNTER — Encounter (HOSPITAL_COMMUNITY): Payer: Commercial Managed Care - HMO

## 2014-11-18 ENCOUNTER — Encounter (HOSPITAL_COMMUNITY): Payer: Commercial Managed Care - HMO

## 2014-11-19 ENCOUNTER — Other Ambulatory Visit: Payer: Self-pay | Admitting: Physician Assistant

## 2014-11-20 ENCOUNTER — Encounter (HOSPITAL_COMMUNITY): Payer: Commercial Managed Care - HMO

## 2014-11-22 ENCOUNTER — Encounter (HOSPITAL_COMMUNITY): Payer: Commercial Managed Care - HMO

## 2014-11-25 ENCOUNTER — Encounter (HOSPITAL_COMMUNITY): Payer: Commercial Managed Care - HMO

## 2014-11-27 ENCOUNTER — Encounter (HOSPITAL_COMMUNITY): Payer: Medicare HMO

## 2014-11-28 ENCOUNTER — Other Ambulatory Visit: Payer: Self-pay | Admitting: Physician Assistant

## 2014-11-29 ENCOUNTER — Encounter (HOSPITAL_COMMUNITY): Payer: Medicare HMO

## 2014-12-02 ENCOUNTER — Encounter (HOSPITAL_COMMUNITY): Payer: Medicare HMO

## 2014-12-02 ENCOUNTER — Ambulatory Visit (INDEPENDENT_AMBULATORY_CARE_PROVIDER_SITE_OTHER): Payer: Commercial Managed Care - HMO | Admitting: *Deleted

## 2014-12-02 ENCOUNTER — Ambulatory Visit (INDEPENDENT_AMBULATORY_CARE_PROVIDER_SITE_OTHER): Payer: Commercial Managed Care - HMO | Admitting: Cardiology

## 2014-12-02 ENCOUNTER — Encounter: Payer: Self-pay | Admitting: Cardiology

## 2014-12-02 VITALS — BP 124/80 | HR 68 | Ht 73.0 in | Wt 188.8 lb

## 2014-12-02 DIAGNOSIS — I4891 Unspecified atrial fibrillation: Secondary | ICD-10-CM | POA: Diagnosis not present

## 2014-12-02 DIAGNOSIS — I480 Paroxysmal atrial fibrillation: Secondary | ICD-10-CM

## 2014-12-02 DIAGNOSIS — Z951 Presence of aortocoronary bypass graft: Secondary | ICD-10-CM | POA: Diagnosis not present

## 2014-12-02 DIAGNOSIS — Z5181 Encounter for therapeutic drug level monitoring: Secondary | ICD-10-CM | POA: Diagnosis not present

## 2014-12-02 DIAGNOSIS — I10 Essential (primary) hypertension: Secondary | ICD-10-CM

## 2014-12-02 LAB — CBC WITH DIFFERENTIAL/PLATELET
Basophils Absolute: 0 10*3/uL (ref 0.0–0.1)
Basophils Relative: 0.4 % (ref 0.0–3.0)
Eosinophils Absolute: 0.1 10*3/uL (ref 0.0–0.7)
Eosinophils Relative: 2.6 % (ref 0.0–5.0)
HCT: 44 % (ref 39.0–52.0)
HEMOGLOBIN: 14.6 g/dL (ref 13.0–17.0)
Lymphocytes Relative: 31.6 % (ref 12.0–46.0)
Lymphs Abs: 1.8 10*3/uL (ref 0.7–4.0)
MCHC: 33.2 g/dL (ref 30.0–36.0)
MCV: 88.7 fl (ref 78.0–100.0)
Monocytes Absolute: 0.5 10*3/uL (ref 0.1–1.0)
Monocytes Relative: 9.5 % (ref 3.0–12.0)
NEUTROS PCT: 55.9 % (ref 43.0–77.0)
Neutro Abs: 3.2 10*3/uL (ref 1.4–7.7)
PLATELETS: 232 10*3/uL (ref 150.0–400.0)
RBC: 4.96 Mil/uL (ref 4.22–5.81)
RDW: 14 % (ref 11.5–15.5)
WBC: 5.8 10*3/uL (ref 4.0–10.5)

## 2014-12-02 LAB — BASIC METABOLIC PANEL
BUN: 11 mg/dL (ref 6–23)
CO2: 29 meq/L (ref 19–32)
Calcium: 9.3 mg/dL (ref 8.4–10.5)
Chloride: 104 mEq/L (ref 96–112)
Creatinine, Ser: 0.74 mg/dL (ref 0.40–1.50)
GFR: 111.95 mL/min (ref >60.00)
Glucose, Bld: 104 mg/dL — ABNORMAL HIGH (ref 70–99)
Potassium: 4.6 mEq/L (ref 3.5–5.1)
Sodium: 136 mEq/L (ref 135–145)

## 2014-12-02 LAB — POCT INR: INR: 1.5

## 2014-12-02 MED ORDER — APIXABAN 5 MG PO TABS: 5.0000 mg | ORAL_TABLET | Freq: Two times a day (BID) | ORAL | Status: DC

## 2014-12-02 NOTE — Progress Notes (Signed)
Cardiology Office Note   Date:  12/02/2014   ID:  [name], DOB [birth year], MRN [id number]  PCP:  Scarlette Calico, MD  Cardiologist:    Darlin Coco, MD   Chief Complaint  Patient presents with  . Follow-up    atrial fib      History of Present Illness: [name] is a 69 y.o. male who presents for 2 month follow-up office visit  HPI: 69 year old Caucasian male with a past medical history of HTN, dyslipidemia, remote tobacco abuse, and posterior circulation CVA 4 yrs ago (no obvious cause, no documented AF-Rx'd with ASA/Plavix) who was found to have severe three vessel coronary artery disease with preserved LVF at cath 08/29/14. He underwent CABG x 4 on 09/06/14. He was to be discharged 12/14 but had PAF. He converted spontaneously to NSR with the addition of Diltiazem. He was discharged on ASA, Coumadin, and Plavix. He is sen in the office today for follow up. He has been doing well. He admits to occasional palpitations since discharge.   Past Medical History  Diagnosis Date  . Alcohol abuse, unspecified   . Hypertension   . Hyperlipidemia     preTx 114, postTx 47 LDL  . H/O renal calculi   . Arthritis     in back and shoulder  . Anginal pain   . Shortness of breath dyspnea     with exertion  . Stroke     August '11 x 2: L. cerebellar, lateral medulla, ol rd lacunar infarcts;  2nd left pontine infarct.  . History of kidney stones     Past Surgical History  Procedure Laterality Date  . Tonsillectomy  1954  . Lumbar disc surgery  1986    Dr. Juanetta Snow  . Tonsillectomy    . Back surgery  1986  . Rotator cuff repair  06/10/12    at [facility]...left shoulder  . Left heart catheterization with coronary angiogram N/A 08/29/2014    Procedure: LEFT HEART CATHETERIZATION WITH CORONARY ANGIOGRAM;  Surgeon: Lorretta Harp, MD;  Location: [facility] CATH LAB;  Service: Cardiovascular;  Laterality: N/A;  . Coronary artery bypass graft N/A 09/06/2014   Procedure: CORONARY ARTERY BYPASS GRAFTING (CABG);  Surgeon: Ivin Poot, MD;  Location: [place];  Service: Open Heart Surgery;  Laterality: N/A;  CABG times 4, using internal mammary artery, and right leg saphenous vein harvested endoscopically  . Tee without cardioversion N/A 09/06/2014    Procedure: TRANSESOPHAGEAL ECHOCARDIOGRAM (TEE);  Surgeon: Ivin Poot, MD;  Location: [place];  Service: Open Heart Surgery;  Laterality: N/A;     Current Outpatient Prescriptions  Medication Sig Dispense Refill  . acetaminophen (TYLENOL) 325 MG tablet Take 2 tablets (650 mg total) by mouth every 4 (four) hours as needed for headache or mild pain.    Marland Kitchen atorvastatin (LIPITOR) 20 MG tablet Take 1 tablet (20 mg total) by mouth daily. 90 tablet 3  . metoprolol tartrate (LOPRESSOR) 25 MG tablet Take 1.5 tablets (37.5 mg total) by mouth 2 (two) times daily. 90 tablet 5  . NITROSTAT 0.4 MG SL tablet Place 1 tablet under the tongue as needed.  2  . apixaban (ELIQUIS) 5 MG TABS tablet Take 1 tablet (5 mg total) by mouth 2 (two) times daily. 60 tablet 5   No current facility-administered medications for this visit.    Allergies:   Review of patient's allergies indicates no known allergies.    Social History:  The  patient  reports that he quit smoking about 36 years ago. His smoking use included Cigarettes. He has a 15 pack-year smoking history. He has never used smokeless tobacco. He reports that he drinks about 3.6 oz of alcohol per week. He reports that he does not use illicit drugs.   Family History:  The patient's family history includes Heart disease in his father; Hypertension in his father. There is no history of Cancer, Diabetes, Early death, Hearing loss, Hyperlipidemia, Kidney disease, Stroke, or Alcohol abuse.    ROS:  Please see the history of present illness.   Otherwise, review of systems are positive for none.   All other systems are reviewed and negative.    PHYSICAL EXAM: VS:  BP 124/80  mmHg  Pulse 68  Ht 6\' 1"  (1.854 m)  Wt 188 lb 12.8 oz (85.639 kg)  BMI 24.91 kg/m2 , BMI Body mass index is 24.91 kg/(m^2). GEN: Well nourished, well developed, in no acute distress HEENT: normal Neck: no JVD, carotid bruits, or masses Cardiac: RRR; no murmurs, rubs, or gallops,no edema  Respiratory:  clear to auscultation bilaterally, normal work of breathing GI: soft, nontender, nondistended, + BS MS: no deformity or atrophy Skin: warm and dry, no rash Neuro:  Strength and sensation are intact Psych: euthymic mood, full affect   EKG:  EKG is ordered today. The ekg ordered today demonstrates normal sinus rhythm with sinus arrhythmia   Recent Labs: 02/19/2014: TSH 1.48 09/05/2014: ALT 38 09/11/2014: Magnesium 2.2 12/02/2014: BUN 11; Creatinine 0.74; Hemoglobin 14.6; Platelets 232.0; Potassium 4.6; Sodium 136    Lipid Panel    Component Value Date/Time   CHOL 154 02/19/2014 1156   TRIG 45.0 02/19/2014 1156   HDL 68.50 02/19/2014 1156   CHOLHDL 2 02/19/2014 1156   VLDL 9.0 02/19/2014 1156   LDLCALC 77 02/19/2014 1156      Wt Readings from Last 3 Encounters:  12/02/14 188 lb 12.8 oz (85.639 kg)  11/07/14 187 lb 9.8 oz (85.1 kg)  10/09/14 188 lb (85.276 kg)        ASSESSMENT AND PLAN:  1.  Ischemic heart disease status post CABG 4 in December 2015. 2.  Past history of cryptogenic stroke 3.  Past history of paroxysmal atrial fibrillation, chads risk score is 5. 4.  Essential hypertension   Current medicines are reviewed at length with the patient today.  The patient has concerns regarding medicines.  He does not like coming to have his blood checked all the time for INRs.  We talked about using one of the newer agents.  He would prefer to do that.  We will start him on Eliquis 5 mg twice a day.  He has normal renal function.  The following changes have been made:  Start Eliquis 5 mg twice a day when his INR is less than 2.0 He will also stop his baby aspirin.  He  will stop his warfarin.  He will stop his diltiazem but continue with metoprolol tartrate 37.5 mg twice a day  Labs/ tests ordered today include: None   Orders Placed This Encounter  Procedures  . Basic metabolic panel  . CBC with Differential/Platelet  . EKG 12-Lead     Disposition:   FU with Dr. Mare Ferrari in 2 months for office visit and EKG    Signed, Darlin Coco, MD  12/02/2014 5:53 PM    [facility] [address], [place], [place]  [address] Phone: [phone]; Fax: [phone]

## 2014-12-02 NOTE — Patient Instructions (Addendum)
Will obtain labs today and call you with the results (BMET/CBC)  START ELIQUIS  5 MG TWICE A DAY AND STOP WARFARIN WHEN YOUR INR IS LESS THAN 2.0  STOP ASPIRIN  STOP DILTIAZEM   Your physician recommends that you schedule a follow-up appointment in: 2 MONTH OV/EKG

## 2014-12-02 NOTE — Patient Instructions (Addendum)
Pt was started on Eliquis  5mg  q 12 hours for Atrial Fib on March7.2016.    Reviewed patients medication list.  Pt is not  currently on any combined P-gp and strong CYP3A4 inhibitors/inducers (ketoconazole, traconazole, ritonavir, carbamazepine, phenytoin, rifampin, St. John's wort).  Reviewed labs.  SCr 0.74 , Weight 85.639 kg, .  Dose is appropriate  based on labs and age and weight.   Hgb 14.6 and HCT 44.0  A full discussion of the nature of anticoagulants has been carried out.  A benefit/risk analysis has been presented to the patient, so that they understand the justification for choosing anticoagulation with Eliquis at this time.  The need for compliance is stressed.  Pt is aware to take the medication twice daily.  Side effects of potential bleeding are discussed, including unusual colored urine or stools, coughing up blood or coffee ground emesis, nose bleeds or serious fall or head trauma.  Discussed signs and symptoms of stroke. The patient should avoid any OTC items containing aspirin or ibuprofen.  Avoid alcohol consumption.   Call if any signs of abnormal bleeding.  Discussed financial obligations and resolved any difficulty in obtaining medication.  Next lab test test in 1 month. 12/03/2014 Spoke with pt  and instructed that labs are good and to continue with Eliquis 5mg  every 12 hours and to keep his appt in clinic on April 4th and he states understanding

## 2014-12-03 ENCOUNTER — Telehealth (HOSPITAL_COMMUNITY): Payer: Self-pay | Admitting: *Deleted

## 2014-12-04 ENCOUNTER — Encounter (HOSPITAL_COMMUNITY): Payer: Medicare HMO

## 2014-12-04 NOTE — Progress Notes (Signed)
Quick Note:  Please report to patient. The recent labs are stable. Continue same medication and careful diet. ______ 

## 2014-12-06 ENCOUNTER — Encounter (HOSPITAL_COMMUNITY): Payer: Medicare HMO

## 2014-12-09 ENCOUNTER — Encounter (HOSPITAL_COMMUNITY): Payer: Medicare HMO

## 2014-12-11 ENCOUNTER — Encounter (HOSPITAL_COMMUNITY): Payer: Medicare HMO

## 2014-12-13 ENCOUNTER — Encounter (HOSPITAL_COMMUNITY): Payer: Medicare HMO

## 2014-12-16 ENCOUNTER — Encounter (HOSPITAL_COMMUNITY): Payer: Medicare HMO

## 2014-12-18 ENCOUNTER — Encounter (HOSPITAL_COMMUNITY): Payer: Medicare HMO

## 2014-12-20 ENCOUNTER — Encounter (HOSPITAL_COMMUNITY): Payer: Medicare HMO

## 2014-12-23 ENCOUNTER — Encounter (HOSPITAL_COMMUNITY): Payer: Medicare HMO

## 2014-12-25 ENCOUNTER — Encounter (HOSPITAL_COMMUNITY): Payer: Medicare HMO

## 2014-12-27 ENCOUNTER — Encounter (HOSPITAL_COMMUNITY): Payer: Medicare HMO

## 2014-12-30 ENCOUNTER — Ambulatory Visit (INDEPENDENT_AMBULATORY_CARE_PROVIDER_SITE_OTHER): Payer: Commercial Managed Care - HMO | Admitting: *Deleted

## 2014-12-30 ENCOUNTER — Encounter (HOSPITAL_COMMUNITY): Payer: Medicare HMO

## 2014-12-30 DIAGNOSIS — I4891 Unspecified atrial fibrillation: Secondary | ICD-10-CM

## 2014-12-30 DIAGNOSIS — Z951 Presence of aortocoronary bypass graft: Secondary | ICD-10-CM | POA: Diagnosis not present

## 2014-12-30 LAB — BASIC METABOLIC PANEL
BUN: 12 mg/dL (ref 6–23)
CHLORIDE: 107 meq/L (ref 96–112)
CO2: 26 mEq/L (ref 19–32)
Calcium: 9.1 mg/dL (ref 8.4–10.5)
Creatinine, Ser: 0.72 mg/dL (ref 0.40–1.50)
GFR: 115.52 mL/min (ref >60.00)
GLUCOSE: 104 mg/dL — AB (ref 70–99)
POTASSIUM: 4.1 meq/L (ref 3.5–5.1)
Sodium: 139 mEq/L (ref 135–145)

## 2014-12-30 LAB — CBC
HCT: 43.2 % (ref 39.0–52.0)
Hemoglobin: 14.2 g/dL (ref 13.0–17.0)
MCHC: 32.9 g/dL (ref 30.0–36.0)
MCV: 87.9 fl (ref 78.0–100.0)
PLATELETS: 187 10*3/uL (ref 150.0–400.0)
RBC: 4.92 Mil/uL (ref 4.22–5.81)
RDW: 14.2 % (ref 11.5–15.5)
WBC: 5 10*3/uL (ref 4.0–10.5)

## 2014-12-30 NOTE — Progress Notes (Signed)
Pt was started on Eliquis 5mg s BID for Afib  on 12/02/14.    Reviewed patients medication list.  Pt is not  currently on any combined P-gp and strong CYP3A4 inhibitors/inducers (ketoconazole, traconazole, ritonavir, carbamazepine, phenytoin, rifampin, St. John's wort).  Reviewed labs.  SCr 0.72, Weight 84.5 Kg, Age 69 yrs old.  Dose appropriate based on specified criteria. H&H WNL.   A full discussion of the nature of anticoagulants has been carried out.  A benefit/risk analysis has been presented to the patient, so that they understand the justification for choosing anticoagulation with Eliquis at this time.  The need for compliance is stressed.  Pt is aware to take the medication twice daily.  Side effects of potential bleeding are discussed, including unusual colored urine or stools, coughing up blood or coffee ground emesis, nose bleeds or serious fall or head trauma.  Discussed signs and symptoms of stroke. The patient should avoid any OTC items containing aspirin or ibuprofen.  Avoid alcohol consumption.   Call if any signs of abnormal bleeding.  Discussed financial obligations and resolved any difficulty in obtaining medication. Pt aware to follow up with Cardiologist as scheduled in May.

## 2014-12-31 ENCOUNTER — Encounter: Payer: Self-pay | Admitting: Cardiology

## 2014-12-31 NOTE — Progress Notes (Signed)
Quick Note:  Please report to patient. The recent labs are stable. Continue same medication and careful diet. ______ 

## 2015-01-01 ENCOUNTER — Encounter (HOSPITAL_COMMUNITY): Payer: Medicare HMO

## 2015-01-03 ENCOUNTER — Encounter (HOSPITAL_COMMUNITY): Payer: Medicare HMO

## 2015-01-06 ENCOUNTER — Encounter (HOSPITAL_COMMUNITY): Payer: Medicare HMO

## 2015-01-08 ENCOUNTER — Encounter (HOSPITAL_COMMUNITY): Payer: Medicare HMO

## 2015-01-10 ENCOUNTER — Encounter (HOSPITAL_COMMUNITY): Payer: Medicare HMO

## 2015-01-13 ENCOUNTER — Encounter (HOSPITAL_COMMUNITY): Payer: Medicare HMO

## 2015-01-15 ENCOUNTER — Encounter (HOSPITAL_COMMUNITY): Payer: Medicare HMO

## 2015-01-17 ENCOUNTER — Encounter (HOSPITAL_COMMUNITY): Payer: Medicare HMO

## 2015-01-20 ENCOUNTER — Encounter (HOSPITAL_COMMUNITY): Payer: Medicare HMO

## 2015-01-22 ENCOUNTER — Encounter (HOSPITAL_COMMUNITY): Payer: Medicare HMO

## 2015-01-24 ENCOUNTER — Encounter (HOSPITAL_COMMUNITY): Payer: Medicare HMO

## 2015-01-27 ENCOUNTER — Encounter (HOSPITAL_COMMUNITY): Payer: Medicare HMO

## 2015-01-29 ENCOUNTER — Encounter (HOSPITAL_COMMUNITY): Payer: Medicare HMO

## 2015-01-31 ENCOUNTER — Encounter (HOSPITAL_COMMUNITY): Payer: Medicare HMO

## 2015-02-03 ENCOUNTER — Encounter (HOSPITAL_COMMUNITY): Payer: Medicare HMO

## 2015-02-05 ENCOUNTER — Encounter (HOSPITAL_COMMUNITY): Payer: Medicare HMO

## 2015-02-07 ENCOUNTER — Encounter (HOSPITAL_COMMUNITY): Payer: Medicare HMO

## 2015-02-10 ENCOUNTER — Ambulatory Visit: Payer: Commercial Managed Care - HMO | Admitting: Cardiology

## 2015-02-10 ENCOUNTER — Encounter (HOSPITAL_COMMUNITY): Payer: Medicare HMO

## 2015-02-12 ENCOUNTER — Encounter (HOSPITAL_COMMUNITY): Payer: Medicare HMO

## 2015-02-14 ENCOUNTER — Encounter (HOSPITAL_COMMUNITY): Payer: Medicare HMO

## 2015-02-21 ENCOUNTER — Encounter: Payer: Commercial Managed Care - HMO | Admitting: Internal Medicine

## 2015-02-25 ENCOUNTER — Other Ambulatory Visit (INDEPENDENT_AMBULATORY_CARE_PROVIDER_SITE_OTHER): Payer: Commercial Managed Care - HMO

## 2015-02-25 ENCOUNTER — Encounter: Payer: Self-pay | Admitting: Internal Medicine

## 2015-02-25 ENCOUNTER — Ambulatory Visit (INDEPENDENT_AMBULATORY_CARE_PROVIDER_SITE_OTHER): Payer: Commercial Managed Care - HMO | Admitting: Internal Medicine

## 2015-02-25 VITALS — BP 130/80 | HR 67 | Temp 97.8°F | Resp 16 | Ht 73.0 in | Wt 186.0 lb

## 2015-02-25 DIAGNOSIS — N400 Enlarged prostate without lower urinary tract symptoms: Secondary | ICD-10-CM

## 2015-02-25 DIAGNOSIS — B1710 Acute hepatitis C without hepatic coma: Secondary | ICD-10-CM | POA: Diagnosis not present

## 2015-02-25 DIAGNOSIS — R7989 Other specified abnormal findings of blood chemistry: Secondary | ICD-10-CM

## 2015-02-25 DIAGNOSIS — E785 Hyperlipidemia, unspecified: Secondary | ICD-10-CM

## 2015-02-25 DIAGNOSIS — R945 Abnormal results of liver function studies: Secondary | ICD-10-CM

## 2015-02-25 DIAGNOSIS — Z0000 Encounter for general adult medical examination without abnormal findings: Secondary | ICD-10-CM | POA: Insufficient documentation

## 2015-02-25 DIAGNOSIS — I10 Essential (primary) hypertension: Secondary | ICD-10-CM | POA: Diagnosis not present

## 2015-02-25 DIAGNOSIS — Z86010 Personal history of colonic polyps: Secondary | ICD-10-CM

## 2015-02-25 DIAGNOSIS — R799 Abnormal finding of blood chemistry, unspecified: Secondary | ICD-10-CM | POA: Diagnosis not present

## 2015-02-25 LAB — URINALYSIS, ROUTINE W REFLEX MICROSCOPIC
BILIRUBIN URINE: NEGATIVE
KETONES UR: NEGATIVE
LEUKOCYTES UA: NEGATIVE
Nitrite: NEGATIVE
Specific Gravity, Urine: 1.025 (ref 1.000–1.030)
Total Protein, Urine: NEGATIVE
UROBILINOGEN UA: 0.2 (ref 0.0–1.0)
Urine Glucose: NEGATIVE
pH: 6 (ref 5.0–8.0)

## 2015-02-25 LAB — CBC WITH DIFFERENTIAL/PLATELET
BASOS ABS: 0 10*3/uL (ref 0.0–0.1)
Basophils Relative: 0.4 % (ref 0.0–3.0)
EOS ABS: 0.2 10*3/uL (ref 0.0–0.7)
Eosinophils Relative: 2.8 % (ref 0.0–5.0)
HCT: 44.7 % (ref 39.0–52.0)
HEMOGLOBIN: 14.8 g/dL (ref 13.0–17.0)
LYMPHS ABS: 2 10*3/uL (ref 0.7–4.0)
Lymphocytes Relative: 35.4 % (ref 12.0–46.0)
MCHC: 33.2 g/dL (ref 30.0–36.0)
MCV: 92.1 fl (ref 78.0–100.0)
Monocytes Absolute: 0.5 10*3/uL (ref 0.1–1.0)
Monocytes Relative: 9 % (ref 3.0–12.0)
NEUTROS ABS: 2.9 10*3/uL (ref 1.4–7.7)
Neutrophils Relative %: 52.4 % (ref 43.0–77.0)
PLATELETS: 203 10*3/uL (ref 150.0–400.0)
RBC: 4.85 Mil/uL (ref 4.22–5.81)
RDW: 14.2 % (ref 11.5–15.5)
WBC: 5.6 10*3/uL (ref 4.0–10.5)

## 2015-02-25 LAB — LIPID PANEL
CHOL/HDL RATIO: 2
Cholesterol: 135 mg/dL (ref 0–200)
HDL: 64.8 mg/dL (ref >39.00)
LDL CALC: 60 mg/dL (ref 0–99)
NONHDL: 70.2
Triglycerides: 49 mg/dL (ref 0.0–149.0)
VLDL: 9.8 mg/dL (ref 0.0–40.0)

## 2015-02-25 LAB — COMPREHENSIVE METABOLIC PANEL
ALK PHOS: 64 U/L (ref 39–117)
ALT: 20 U/L (ref 0–53)
AST: 26 U/L (ref 0–37)
Albumin: 4.2 g/dL (ref 3.5–5.2)
BILIRUBIN TOTAL: 0.7 mg/dL (ref 0.2–1.2)
BUN: 11 mg/dL (ref 6–23)
CALCIUM: 9.3 mg/dL (ref 8.4–10.5)
CO2: 29 mEq/L (ref 19–32)
Chloride: 107 mEq/L (ref 96–112)
Creatinine, Ser: 0.72 mg/dL (ref 0.40–1.50)
GFR: 115.47 mL/min (ref >60.00)
Glucose, Bld: 103 mg/dL — ABNORMAL HIGH (ref 70–99)
Potassium: 4.5 mEq/L (ref 3.5–5.1)
Sodium: 141 mEq/L (ref 135–145)
Total Protein: 7 g/dL (ref 6.0–8.3)

## 2015-02-25 LAB — PSA: PSA: 1.04 ng/mL (ref 0.10–4.00)

## 2015-02-25 LAB — FECAL OCCULT BLOOD, GUAIAC: FECAL OCCULT BLD: NEGATIVE

## 2015-02-25 LAB — TSH: TSH: 1.68 u[IU]/mL (ref 0.35–4.50)

## 2015-02-25 NOTE — Progress Notes (Signed)
Pre visit review using our clinic review tool, if applicable. No additional management support is needed unless otherwise documented below in the visit note. 

## 2015-02-25 NOTE — Progress Notes (Signed)
Subjective:  Patient ID: [name], male    DOB: 1946/04/28  Age: 69 y.o. MRN: [id number]  CC: Hypertension and Annual Exam   HPI [name] presents for a CPX and follow up on HTN and hypercholesterolemia. He feels well and offers no complaints.  Outpatient Prescriptions Prior to Visit  Medication Sig Dispense Refill  . acetaminophen (TYLENOL) 325 MG tablet Take 2 tablets (650 mg total) by mouth every 4 (four) hours as needed for headache or mild pain.    Marland Kitchen apixaban (ELIQUIS) 5 MG TABS tablet Take 1 tablet (5 mg total) by mouth 2 (two) times daily. 60 tablet 5  . atorvastatin (LIPITOR) 20 MG tablet Take 1 tablet (20 mg total) by mouth daily. 90 tablet 3  . metoprolol tartrate (LOPRESSOR) 25 MG tablet Take 1.5 tablets (37.5 mg total) by mouth 2 (two) times daily. 90 tablet 5  . NITROSTAT 0.4 MG SL tablet Place 1 tablet under the tongue as needed.  2   No facility-administered medications prior to visit.    ROS Review of Systems  Constitutional: Negative.  Negative for fever, chills, diaphoresis, appetite change and fatigue.  HENT: Negative.   Eyes: Negative.   Respiratory: Negative.  Negative for cough, choking, chest tightness, shortness of breath and stridor.   Cardiovascular: Negative.  Negative for chest pain, palpitations and leg swelling.  Gastrointestinal: Negative.  Negative for nausea, vomiting, abdominal pain, diarrhea, constipation and blood in stool.  Endocrine: Negative.   Genitourinary: Negative.  Negative for dysuria, urgency, difficulty urinating and testicular pain.  Musculoskeletal: Negative.  Negative for myalgias, back pain and neck pain.  Skin: Negative.  Negative for rash.  Allergic/Immunologic: Negative.   Neurological: Negative.  Negative for dizziness, tremors, weakness, light-headedness, numbness and headaches.  Hematological: Negative.  Negative for adenopathy. Does not bruise/bleed easily.  Psychiatric/Behavioral: Negative.     Objective:   BP 130/80 mmHg  Pulse 67  Temp(Src) 97.8 F (36.6 C) (Oral)  Resp 16  Ht 6\' 1"  (1.854 m)  Wt 186 lb (84.369 kg)  BMI 24.55 kg/m2  SpO2 97%  BP Readings from Last 3 Encounters:  02/25/15 130/80  12/02/14 124/80  11/07/14 158/68    Wt Readings from Last 3 Encounters:  02/25/15 186 lb (84.369 kg)  12/02/14 188 lb 12.8 oz (85.639 kg)  11/07/14 187 lb 9.8 oz (85.1 kg)    Physical Exam  Constitutional: He is oriented to person, place, and time. He appears well-developed and well-nourished. No distress.  HENT:  Head: Normocephalic and atraumatic.  Mouth/Throat: Oropharynx is clear and moist. No oropharyngeal exudate.  Eyes: Conjunctivae are normal. Right eye exhibits no discharge. Left eye exhibits no discharge. No scleral icterus.  Neck: Normal range of motion. Neck supple. No JVD present. No tracheal deviation present. No thyromegaly present.  Cardiovascular: Normal rate, regular rhythm, normal heart sounds and intact distal pulses.  Exam reveals no gallop and no friction rub.   No murmur heard. Pulmonary/Chest: Effort normal and breath sounds normal. No stridor. No respiratory distress. He has no wheezes. He has no rales. He exhibits no tenderness.  Abdominal: Soft. Bowel sounds are normal. He exhibits no distension and no mass. There is no tenderness. There is no rebound and no guarding. Hernia confirmed negative in the right inguinal area and confirmed negative in the left inguinal area.  Genitourinary: Rectum normal, testes normal and penis normal. Rectal exam shows no external hemorrhoid, no internal hemorrhoid, no fissure, no mass, no tenderness and anal  tone normal. Guaiac negative stool. Prostate is enlarged (1+ smooth symm BPH). Prostate is not tender. Right testis shows no mass, no swelling and no tenderness. Right testis is descended. Left testis shows no mass, no swelling and no tenderness. Left testis is descended. Circumcised. No penile erythema or penile tenderness. No  discharge found.  Musculoskeletal: Normal range of motion. He exhibits no edema or tenderness.  Lymphadenopathy:    He has no cervical adenopathy.       Right: No inguinal adenopathy present.       Left: No inguinal adenopathy present.  Neurological: He is oriented to person, place, and time.  Skin: Skin is warm and dry. No rash noted. He is not diaphoretic. No erythema. No pallor.  Psychiatric: He has a normal mood and affect. His behavior is normal. Judgment and thought content normal.  Vitals reviewed.   Lab Results  Component Value Date   WBC 5.0 12/30/2014   HGB 14.2 12/30/2014   HCT 43.2 12/30/2014   PLT 187.0 12/30/2014   GLUCOSE 104* 12/30/2014   CHOL 154 02/19/2014   TRIG 45.0 02/19/2014   HDL 68.50 02/19/2014   LDLCALC 77 02/19/2014   ALT 38 09/05/2014   AST 43* 09/05/2014   NA 139 12/30/2014   K 4.1 12/30/2014   CL 107 12/30/2014   CREATININE 0.72 12/30/2014   BUN 12 12/30/2014   CO2 26 12/30/2014   TSH 1.48 02/19/2014   PSA 1.22 02/19/2014   INR 1.5 12/02/2014   HGBA1C 5.7* 09/05/2014    No results found.  Assessment & Plan:   [name] was seen today for hypertension and annual exam.  Diagnoses and all orders for this visit:  Essential hypertension - his BP is well controlled, will monitor his lytes and renal function Orders: -     Comprehensive metabolic panel; Future -     CBC with Differential/Platelet; Future -     Urinalysis, Routine w reflex microscopic (not at [facility]); Future  Hyperlipidemia with target LDL less than 100 - he is doing well on the statin Orders: -     Lipid panel; Future -     TSH; Future  Elevated LFTs - will screen for Hep C and will recheck the LFT's. Will advise further if LFT's remain elevated. Orders: -     Comprehensive metabolic panel; Future -     Hepatitis C antibody; Future  BPH (benign prostatic hyperplasia) - will check his PSA to screen for prostate cancer Orders: -     PSA; Future -     Urinalysis, Routine w  reflex microscopic (not at [facility]); Future  History of colonic polyps Orders: -     Ambulatory referral to Gastroenterology  I am having [name] maintain his atorvastatin, acetaminophen, NITROSTAT, metoprolol tartrate, and apixaban.  No orders of the defined types were placed in this encounter.   See AVS for instructions about healthy living and anticipatory guidance.  Follow-up: Return in about 6 months (around 08/27/2015).  Scarlette Calico, MD

## 2015-02-25 NOTE — Patient Instructions (Signed)

## 2015-02-25 NOTE — Assessment & Plan Note (Signed)

## 2015-02-26 LAB — HEPATITIS C ANTIBODY: HCV Ab: REACTIVE — AB

## 2015-02-27 ENCOUNTER — Encounter: Payer: Self-pay | Admitting: Internal Medicine

## 2015-02-27 ENCOUNTER — Other Ambulatory Visit: Payer: Self-pay | Admitting: Internal Medicine

## 2015-02-27 DIAGNOSIS — R768 Other specified abnormal immunological findings in serum: Secondary | ICD-10-CM | POA: Insufficient documentation

## 2015-02-27 LAB — HEPATITIS C RNA QUANTITATIVE: HCV Quantitative: NOT DETECTED IU/mL (ref <15)

## 2015-03-05 ENCOUNTER — Telehealth: Payer: Self-pay | Admitting: Internal Medicine

## 2015-03-05 NOTE — Telephone Encounter (Signed)
Patient has questions in regards to recent lab work.

## 2015-03-06 NOTE — Telephone Encounter (Signed)
Patient is calling back regarding questions about his recent lab work

## 2015-03-10 NOTE — Telephone Encounter (Signed)
Patient is calling again regarding his lab work. He is very concerned about some of the results mostly the hep c and he is starting to get upset because he hasn't gotten a call back yet.

## 2015-03-11 NOTE — Telephone Encounter (Signed)
Spoke with patient who is upset about no return call. I explained result to him and  Advised  that I received message on last Thursday, was not in the office on Friday, and due to clinic needs unable to return call back. Patient states that he has been a patient over 20 years,no one else has ever ordered this type of testing. He also states that with the lifestyle that he has, that he has never been exposed to Hep C. He would like a call back from manager with answers to the following questions. 1) Why was test ordered 2) who ordered it 3) would like a detailed explanation of what and how this false-positive could be?

## 2015-03-13 NOTE — Telephone Encounter (Signed)
1) the CDC recommends that all baby boomers be screened for Hep C 2) I ordered the test 3) there is a small chance of false positive result and 1/4 people who have been exposed to the virus have a positive ab test but no evidence of active virus - he could be either one of these but there is no reason to distinguish between these at this time as it well not change his management of this

## 2015-03-13 NOTE — Telephone Encounter (Signed)
Spoke with [name] and explained recommendations and results per Dr. Ronnald Ramp.  He was very pleased with explanation and thanked me for helping him understand.

## 2015-03-31 ENCOUNTER — Other Ambulatory Visit: Payer: Self-pay | Admitting: Cardiology

## 2015-04-01 ENCOUNTER — Other Ambulatory Visit: Payer: Self-pay | Admitting: *Deleted

## 2015-04-30 ENCOUNTER — Other Ambulatory Visit: Payer: Self-pay

## 2015-04-30 MED ORDER — ATORVASTATIN CALCIUM 20 MG PO TABS: 20.0000 mg | ORAL_TABLET | Freq: Every day | ORAL | Status: DC

## 2015-06-03 ENCOUNTER — Other Ambulatory Visit: Payer: Self-pay | Admitting: Cardiology

## 2015-06-30 ENCOUNTER — Encounter: Payer: Self-pay | Admitting: *Deleted

## 2015-06-30 ENCOUNTER — Telehealth: Payer: Self-pay | Admitting: Cardiology

## 2015-06-30 NOTE — Progress Notes (Signed)
[address] [place], [place]  [address] Phone: [phone] Fax:  [phone]  Date:  07/02/2015   ID:  [name], DOB 1945-09-28, MRN [id number]  PCP:  Scarlette Calico, MD  Cardiologist: Dr. Mare Ferrari    CC: med refill for Eliquis  History of Present Illness: [name] is a 69 y.o. male with a history of HTN, HLD, remote tobacco abuse, posterior circulation CVA, CAD s/p CABG x 4 (08/2014) and post op PAF who presents to clinic today for follow up and medication refill.   He underwent cardiac cath in 08/2014 which revealed severe 4V disease and he was referred for surgical revascularization. He underwent CABG x4V later that month and developed post op atrial fib. He has a CHADSVASC of 5 (HTN, CAD, CVA, Age). He was initially placed on Coumadin for PAF but did not like having to get INRs checked. Dr. Mare Ferrari switched him to Eliquis at 11/2014 appointment.   Today he presents for medication refill of Eliquis. He is a little irritated that he has to come in and this couldn't be done over the phone. He is having no issues and feels great. Only issue he has is residual problems w/ balance after his stroke. He is planning to start working with PT for thid. He denies CP, SOB, LE edema, orthopnea, palpitations or dizziness. He is complaint with all of this medications. He asks if there are any cheaper alternatives to Eliquis,    Studies:  - LHC (08/29/2014): 4V disease including high-grade disease in his proximal LAD, ramus branch and mid dominant RCA with an occluded nondominant circumflex and right to left circumflex collaterals. Eric LV function. Referred for CABG  - Echo (09/04/2014): EF 60-65%, no WMA. G1DD.  Mild LA dilation.   - Carotid US (09/04/2014): 1-39% bilateral ICA stenosis.   Recent Labs: 02/25/2015: ALT 20; Creatinine, Ser 0.72; HDL 64.80; Hemoglobin 14.8; LDL Cholesterol 60; Potassium 4.5; TSH 1.68  Wt Readings from Last 3 Encounters:  07/02/15 187  lb (84.823 kg)  02/25/15 186 lb (84.369 kg)  12/02/14 188 lb 12.8 oz (85.639 kg)     Past Medical History  Diagnosis Date  . Alcohol abuse, unspecified   . Hypertension   . Hyperlipidemia     preTx 114, postTx 47 LDL  . H/O renal calculi   . Arthritis     in back and shoulder  . Anginal pain ([place])   . Shortness of breath dyspnea     with exertion  . Stroke [facility])     August '11 x 2: L. cerebellar, lateral medulla, ol rd lacunar infarcts;  2nd left pontine infarct.  . History of kidney stones     Current Outpatient Prescriptions  Medication Sig Dispense Refill  . acetaminophen (TYLENOL) 325 MG tablet Take 2 tablets (650 mg total) by mouth every 4 (four) hours as needed for headache or mild pain.    Marland Kitchen atorvastatin (LIPITOR) 20 MG tablet Take 1 tablet (20 mg total) by mouth daily. 90 tablet 3  . CIALIS 5 MG tablet TAKE 1 TABLET (5 MG TOTAL) BY MOUTH DAILY AS NEEDED FOR ERECTILE DYSFUNCTION.  4  . ELIQUIS 5 MG TABS tablet TAKE 1 TABLET (5 MG TOTAL) BY MOUTH 2 (TWO) TIMES DAILY. 60 tablet 0  . metoprolol tartrate (LOPRESSOR) 25 MG tablet TAKE 1.5 TABLETS (37.5 MG TOTAL) BY MOUTH 2 (TWO) TIMES DAILY. 90 tablet 6  . NITROSTAT 0.4 MG SL tablet Place 1 tablet  under the tongue as needed for chest pain.   2   No current facility-administered medications for this visit.    Allergies:   Review of patient's allergies indicates no known allergies.   Social History:  The patient  reports that he quit smoking about 36 years ago. His smoking use included Cigarettes. He has a 15 pack-year smoking history. He has never used smokeless tobacco. He reports that he drinks about 3.6 oz of alcohol per week. He reports that he does not use illicit drugs.   Family History:  The patient's family history includes GI problems in his mother; Heart attack in his father; Heart disease in his father; Hypertension in his father. There is no history of Cancer, Diabetes, Early death, Hearing loss, Hyperlipidemia,  Kidney disease, Stroke, or Alcohol abuse.   ROS:  Please see the history of present illness. All other systems reviewed and negative.   PHYSICAL EXAM: VS:  BP 160/80 mmHg  Pulse 72  Ht 6\' 1"  (1.854 m)  Wt 187 lb (84.823 kg)  BMI 24.68 kg/m2 Well nourished, well developed, in no acute distress HEENT: Eric Neck: no JVD Cardiac:  Eric S1, S2; RRR; no murmur Lungs:  clear to auscultation bilaterally, no wheezing, rhonchi or rales Abd: soft, nontender, no hepatomegaly Ext: no edema Skin: warm and dry Neuro:  CNs 2-12 intact, no focal abnormalities noted  EKG:  NSR HR 72.     ASSESSMENT AND PLAN:  [name] is a 69 y.o. male with a history of HTN, dyslipidemia, remote tobacco abuse, posterior circulation CVA, CAD s/p CABG x 4 (08/2014) and post op PAF who presents to clinic today for follow up and medication refill.   PAF- He is in NSR today and has no reoccurrences since post op period in 08/2014 -- He has a CHADSVASC of 5 (HTN, CAD, CVA, Age).  -- Continue Eliquis and metroprolol tart 37.5mg  BID. I have provided him with a refill for Eliquis.  CAD- s/p CABG 08/2015. No recent CP -- Continue statin and BB. No ASA with Eliquis.   Hx of CVA- continue Eliquis  HTN- continue Lopressor 37.5mg  BID. A little high today but patient states that he always has white coat HTN.   Follow up with Dr. Mare Ferrari in 11/2015  Signed, Perry Mount, PA-C  07/02/2015 10:43 AM

## 2015-06-30 NOTE — Telephone Encounter (Signed)
Error

## 2015-07-01 ENCOUNTER — Telehealth: Payer: Self-pay | Admitting: Internal Medicine

## 2015-07-01 ENCOUNTER — Other Ambulatory Visit: Payer: Self-pay

## 2015-07-01 DIAGNOSIS — I6310 Cerebral infarction due to embolism of unspecified precerebral artery: Secondary | ICD-10-CM

## 2015-07-01 NOTE — Telephone Encounter (Signed)
Ok to rf? From previous provider

## 2015-07-01 NOTE — Telephone Encounter (Signed)
Patient is requesting a referral to [place] physical therapy- Dr. Nicanor Bake. This is for PT on right side (stroke).

## 2015-07-02 ENCOUNTER — Ambulatory Visit (INDEPENDENT_AMBULATORY_CARE_PROVIDER_SITE_OTHER): Payer: Commercial Managed Care - HMO | Admitting: Physician Assistant

## 2015-07-02 ENCOUNTER — Encounter: Payer: Self-pay | Admitting: Physician Assistant

## 2015-07-02 VITALS — BP 160/80 | HR 72 | Ht 73.0 in | Wt 187.0 lb

## 2015-07-02 DIAGNOSIS — Z7901 Long term (current) use of anticoagulants: Secondary | ICD-10-CM

## 2015-07-02 DIAGNOSIS — R9439 Abnormal result of other cardiovascular function study: Secondary | ICD-10-CM

## 2015-07-02 DIAGNOSIS — I480 Paroxysmal atrial fibrillation: Secondary | ICD-10-CM

## 2015-07-02 DIAGNOSIS — I4891 Unspecified atrial fibrillation: Secondary | ICD-10-CM | POA: Diagnosis not present

## 2015-07-02 DIAGNOSIS — I10 Essential (primary) hypertension: Secondary | ICD-10-CM | POA: Diagnosis not present

## 2015-07-02 MED ORDER — APIXABAN 5 MG PO TABS: ORAL_TABLET | ORAL | Status: DC

## 2015-07-02 NOTE — Patient Instructions (Addendum)
Medication Instructions:  Your physician recommends that you continue on your current medications as directed. Please refer to the Current Medication list given to you today.   Labwork: None ordered  Testing/Procedures: None ordered  Follow-Up: Your physician wants you to follow-up in:  [place].  You will receive a reminder letter in the mail two months in advance. If you don't receive a letter, please call our office to schedule the follow-up appointment.   Any Other Special Instructions Will Be Listed Below (If Applicable).

## 2015-07-03 ENCOUNTER — Other Ambulatory Visit: Payer: Self-pay

## 2015-07-03 MED ORDER — CIALIS 5 MG PO TABS: ORAL_TABLET | ORAL | Status: DC

## 2015-07-05 ENCOUNTER — Other Ambulatory Visit: Payer: Self-pay | Admitting: Cardiology

## 2015-07-05 NOTE — Telephone Encounter (Signed)
PT ordered.

## 2015-07-07 ENCOUNTER — Other Ambulatory Visit: Payer: Self-pay

## 2015-09-16 ENCOUNTER — Telehealth: Payer: Self-pay | Admitting: Cardiology

## 2015-09-16 NOTE — Telephone Encounter (Signed)
Pt c/o medication issue:  1. Name of Medication: Eliquis 5mg   2. How are you currently taking this medication (dosage and times per day)? 1 tablet 2x a day  3. Are you having a reaction (difficulty breathing--STAT)? No  4. What is your medication issue? Pt's medication is jumping in price and pt want to know if he can stop taking it now and start back in January. Please advise

## 2015-09-16 NOTE — Telephone Encounter (Signed)
ANOTHER  ELIQUIS  DRUG REP  BROUGHT  SAMPLES TODAY     4 WEEKS  LEFT  AT  FRONT DESK FOR  PICK PT  AWARE .Adonis Housekeeper

## 2015-09-16 NOTE — Telephone Encounter (Signed)
SPOKE WITH PT  TO TELL HIM  TO CALL OFFICE   TOM AND  CHECK  TO SEE IF  WE  HAVE SAMPLES  AVAILABLE  FOR PICK  UP  TO TAKE   UNTIL CAN GET  FILLED IN January   PT  VERBALIZED  UNDERSTANDING  . DRUG  REP  ([facility])  TO  BRING  SAMPLES IN AM .Adonis Housekeeper

## 2015-09-24 ENCOUNTER — Other Ambulatory Visit: Payer: Self-pay | Admitting: Internal Medicine

## 2015-11-11 ENCOUNTER — Other Ambulatory Visit: Payer: Self-pay | Admitting: Cardiology

## 2015-11-17 ENCOUNTER — Encounter: Payer: Self-pay | Admitting: Cardiology

## 2015-11-17 ENCOUNTER — Ambulatory Visit (INDEPENDENT_AMBULATORY_CARE_PROVIDER_SITE_OTHER): Payer: Commercial Managed Care - HMO | Admitting: Cardiology

## 2015-11-17 VITALS — BP 140/74 | HR 58 | Ht 73.0 in | Wt 187.8 lb

## 2015-11-17 DIAGNOSIS — Z951 Presence of aortocoronary bypass graft: Secondary | ICD-10-CM | POA: Diagnosis not present

## 2015-11-17 DIAGNOSIS — I4891 Unspecified atrial fibrillation: Secondary | ICD-10-CM | POA: Diagnosis not present

## 2015-11-17 NOTE — Patient Instructions (Signed)
Medication Instructions:  Your physician recommends that you continue on your current medications as directed. Please refer to the Current Medication list given to you today.  Labwork: none  Testing/Procedures: none  Follow-Up: Your physician wants you to follow-up in: 6 month ov with Dr Stanford Breed at the [facility] office You will receive a reminder letter in the mail two months in advance. If you don't receive a letter, please call our office to schedule the follow-up appointment.  If you need a refill on your cardiac medications before your next appointment, please call your pharmacy.

## 2015-11-17 NOTE — Progress Notes (Signed)
Cardiology Office Note   Date:  11/17/2015   ID:  [name], [name] 22-Nov-1945, MRN [id number]  PCP:  Scarlette Calico, MD  Cardiologist: Darlin Coco MD  No chief complaint on file.     History of Present Illness: [name] is a 71 y.o. male who presents for scheduled 6 month follow-up visit.  HPI: 70 year old Caucasian male with a past medical history of HTN, dyslipidemia, remote tobacco abuse, and posterior circulation CVA 4 yrs ago (no obvious cause, no documented AF-Rx'd with ASA/Plavix) who was found to have severe three vessel coronary artery disease with preserved LVF at cath 08/29/14. He underwent CABG x 4 on 09/06/14. He was to be discharged 09/09/14 but had PAF. He converted spontaneously to NSR with the addition of Diltiazem. He was initially discharged on ASA, Coumadin, and Plavix.  Subsequently he was switched from warfarin to Apixaban and his aspirin and Plavix were stopped. The patient has had no subsequent TIA or stroke symptoms.  He has not been aware of any recent palpitations or tachycardia.  Denies any exertional chest pain or recurrent angina. He enjoys staying active.  He has kept his weight down.  He enjoys yard work.  He has been retired for 3 years.  His wife is still working.  Past Medical History  Diagnosis Date  . Alcohol abuse, unspecified   . Hypertension   . Hyperlipidemia     preTx 114, postTx 47 LDL  . H/O renal calculi   . Arthritis     in back and shoulder  . Anginal pain ([place])   . Shortness of breath dyspnea     with exertion  . Stroke [facility])     August '11 x 2: L. cerebellar, lateral medulla, ol rd lacunar infarcts;  2nd left pontine infarct.  . History of kidney stones     Past Surgical History  Procedure Laterality Date  . Tonsillectomy  1954  . Lumbar disc surgery  1986    Dr. Juanetta Snow  . Tonsillectomy    . Back surgery  1986  . Rotator cuff repair  06/10/12    at [place]...left shoulder  . Left heart  catheterization with coronary angiogram N/A 08/29/2014    Procedure: LEFT HEART CATHETERIZATION WITH CORONARY ANGIOGRAM;  Surgeon: Lorretta Harp, MD;  Location: [facility] CATH LAB;  Service: Cardiovascular;  Laterality: N/A;  . Coronary artery bypass graft N/A 09/06/2014    Procedure: CORONARY ARTERY BYPASS GRAFTING (CABG);  Surgeon: Ivin Poot, MD;  Location: [place];  Service: Open Heart Surgery;  Laterality: N/A;  CABG times 4, using internal mammary artery, and right leg saphenous vein harvested endoscopically  . Tee without cardioversion N/A 09/06/2014    Procedure: TRANSESOPHAGEAL ECHOCARDIOGRAM (TEE);  Surgeon: Ivin Poot, MD;  Location: [place];  Service: Open Heart Surgery;  Laterality: N/A;     Current Outpatient Prescriptions  Medication Sig Dispense Refill  . acetaminophen (TYLENOL) 325 MG tablet Take 2 tablets (650 mg total) by mouth every 4 (four) hours as needed for headache or mild pain.    Marland Kitchen atorvastatin (LIPITOR) 20 MG tablet Take 1 tablet (20 mg total) by mouth daily. 90 tablet 3  . CIALIS 5 MG tablet TAKE 1 TABLET (5 MG TOTAL) BY MOUTH DAILY AS NEEDED FOR ERECTILE DYSFUNCTION. 30 tablet 3  . ELIQUIS 5 MG TABS tablet TAKE 1 TABLET (5 MG TOTAL) BY MOUTH 2 (TWO) TIMES DAILY. 60 tablet 1  . metoprolol  tartrate (LOPRESSOR) 25 MG tablet TAKE 1.5 TABLETS (37.5 MG TOTAL) BY MOUTH 2 (TWO) TIMES DAILY. 90 tablet 7  . NITROSTAT 0.4 MG SL tablet Place 1 tablet under the tongue as needed for chest pain.   2   No current facility-administered medications for this visit.    Allergies:   Review of patient's allergies indicates no known allergies.    Social History:  The patient  reports that he quit smoking about 37 years ago. His smoking use included Cigarettes. He has a 15 pack-year smoking history. He has never used smokeless tobacco. He reports that he drinks about 3.6 oz of alcohol per week. He reports that he does not use illicit drugs.   Family History:  The patient's family  history includes GI problems in his mother; Heart attack in his father; Heart disease in his father; Hypertension in his father. There is no history of Cancer, Diabetes, Early death, Hearing loss, Hyperlipidemia, Kidney disease, Stroke, or Alcohol abuse.    ROS:  Please see the history of present illness.   Otherwise, review of systems are positive for none.   All other systems are reviewed and negative.    PHYSICAL EXAM: VS:  BP 140/74 mmHg  Pulse 58  Ht 6\' 1"  (1.854 m)  Wt 187 lb 12.8 oz (85.186 kg)  BMI 24.78 kg/m2 , BMI Body mass index is 24.78 kg/(m^2). GEN: Well nourished, well developed, in no acute distress HEENT: normal Neck: no JVD, carotid bruits, or masses Cardiac: RRR; no murmurs, rubs, or gallops,no edema  Respiratory:  clear to auscultation bilaterally, normal work of breathing GI: soft, nontender, nondistended, + BS MS: no deformity or atrophy Skin: warm and dry, no rash Neuro:  Strength and sensation are intact Psych: euthymic mood, full affect   EKG:  EKG is ordered today. The ekg ordered today demonstrates sinus bradycardia at 59 bpm.  Otherwise normal EKG.   Recent Labs: 02/25/2015: ALT 20; BUN 11; Creatinine, Ser 0.72; Hemoglobin 14.8; Platelets 203.0; Potassium 4.5; Sodium 141; TSH 1.68    Lipid Panel    Component Value Date/Time   CHOL 135 02/25/2015 0910   TRIG 49.0 02/25/2015 0910   HDL 64.80 02/25/2015 0910   CHOLHDL 2 02/25/2015 0910   VLDL 9.8 02/25/2015 0910   LDLCALC 60 02/25/2015 0910      Wt Readings from Last 3 Encounters:  11/17/15 187 lb 12.8 oz (85.186 kg)  07/02/15 187 lb (84.823 kg)  02/25/15 186 lb (84.369 kg)        ASSESSMENT AND PLAN:  1. Ischemic heart disease status post CABG 4 in December 2015. 2. Past history of cryptogenic stroke 3. Past history of paroxysmal atrial fibrillation, chads risk score is 5. 4. Essential hypertension   Current medicines are reviewed at length with the patient today.  The patient  does not have concerns regarding medicines.  The following changes have been made:  no change  Labs/ tests ordered today include:   Orders Placed This Encounter  Procedures  . EKG 12-Lead     Disposition:   Continue current medication.  Recheck in 6 months for office visit with Dr. Stanford Breed.  He is on Lipitor.  His blood work is followed by his PCP  Signed, Darlin Coco MD 11/17/2015 10:14 AM    [address], [place], [place]  [id number] Phone: [phone]; Fax: [phone]

## 2015-12-04 IMAGING — CR DG CHEST 1V PORT
1 series · 1 of 1 positions shown · non-contrast
Comparison: September 05, 2014.

CLINICAL DATA: Status post coronary artery bypass graft.

EXAM:
PORTABLE CHEST - 1 VIEW

[portable]
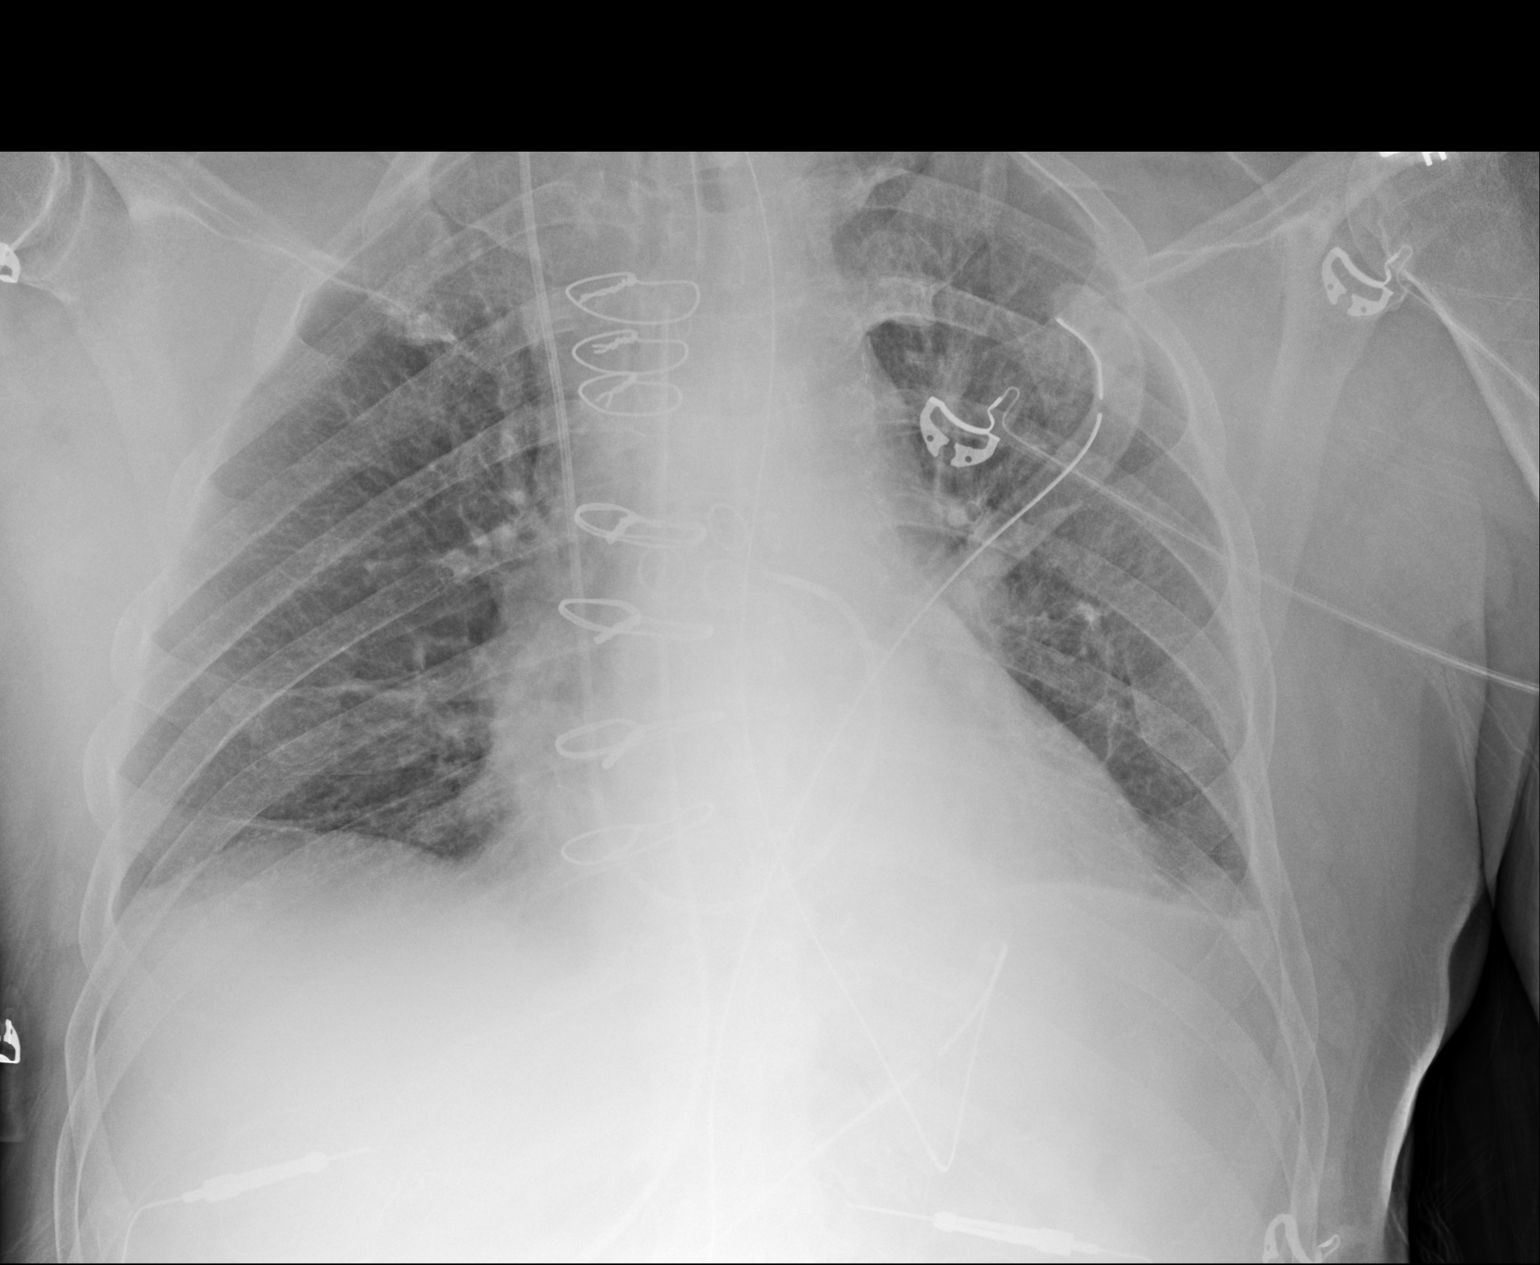

[1 of 1 positions shown; findings below may reference images not displayed]

FINDINGS: Endotracheal tube is in grossly good position with distal tip 5 cm
above the carina. Left-sided chest tube is noted without
pneumothorax. Nasogastric tube is seen entering the stomach. Right
internal jugular Swan-Ganz catheter is noted with distal tip
directed for the right pulmonary artery. Minimal bibasilar
subsegmental atelectasis is noted.
IMPRESSION: No pneumothorax is seen status post coronary artery bypass graft.
Endotracheal and nasogastric tubes are in grossly good position.
Left-sided chest tube and Swan-Ganz catheter also in grossly good
position.

## 2015-12-05 IMAGING — CR DG CHEST 1V PORT
1 series · 1 of 1 positions shown · non-contrast
Comparison: 09/06/2014

CLINICAL DATA: Postop from CABG.

EXAM:
PORTABLE CHEST - 1 VIEW

[AP]
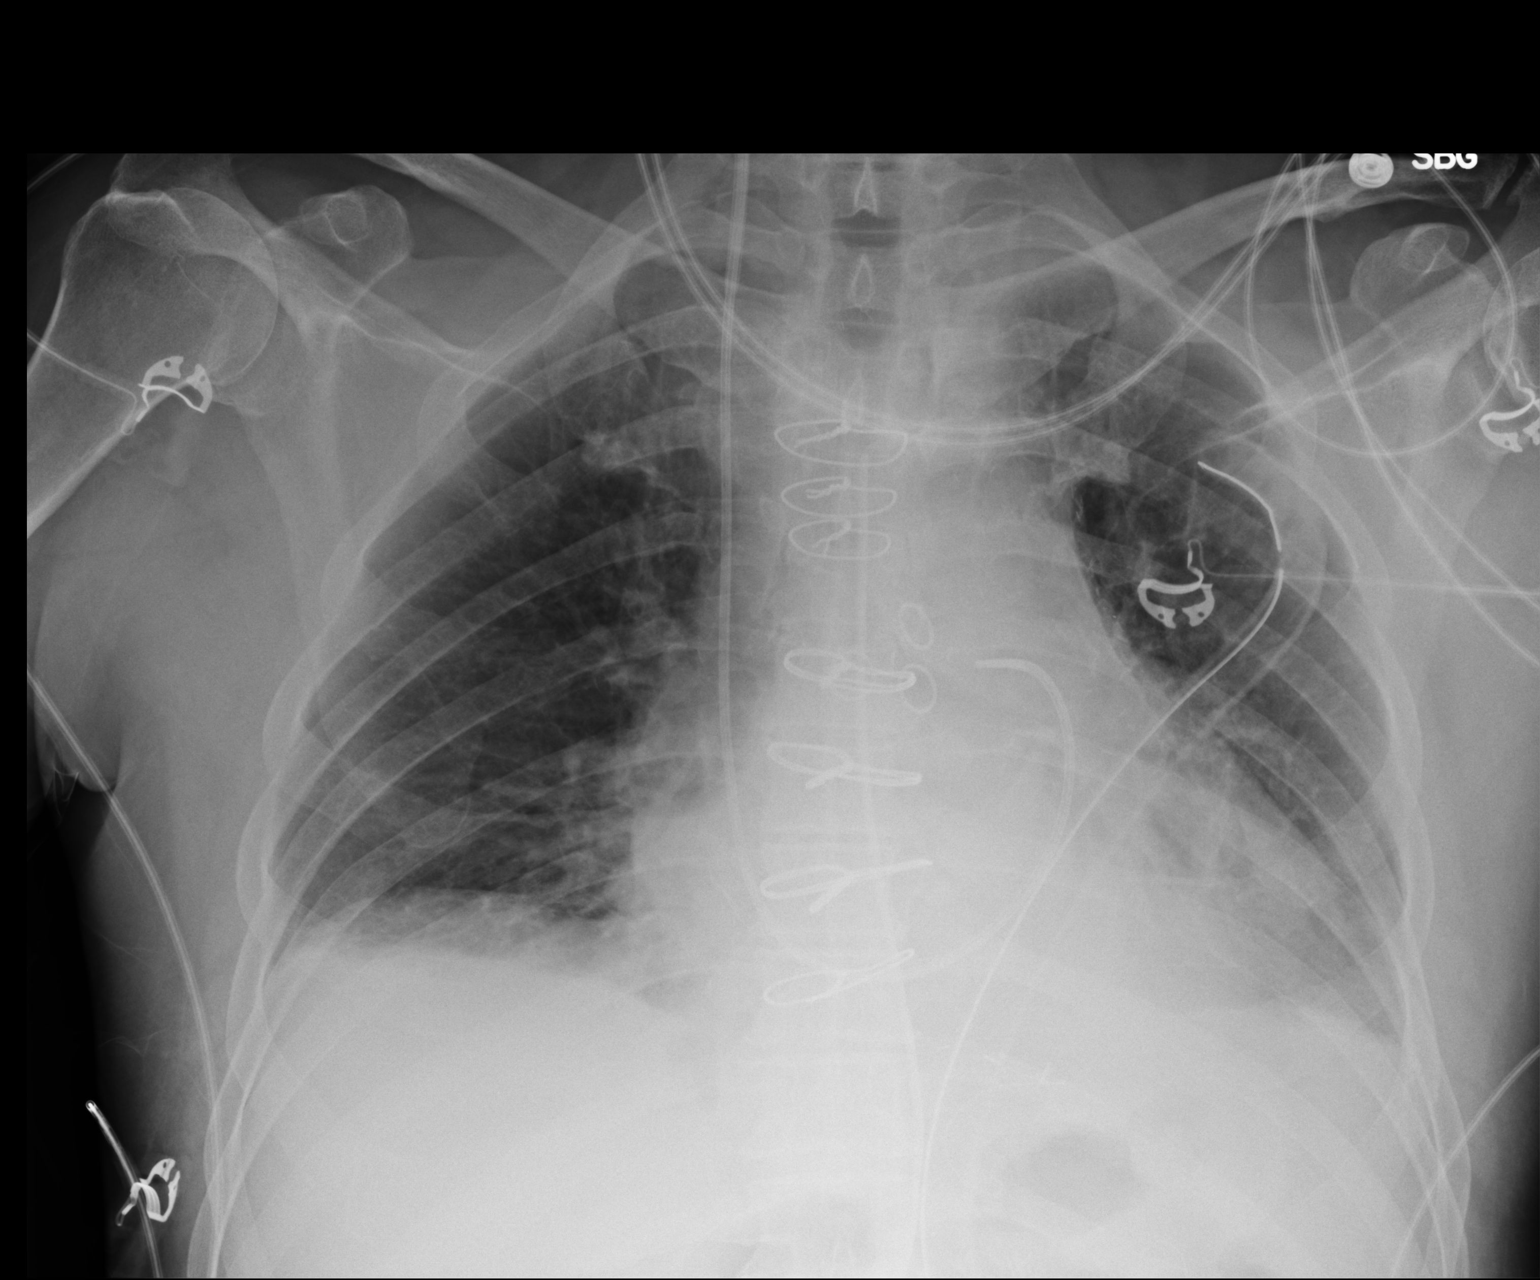

[1 of 1 positions shown; findings below may reference images not displayed]

FINDINGS: Endotracheal tube in orogastric tube have been removed. Swan-Ganz
catheter, mediastinal drain, and left chest tube remain in place. No
pneumothorax identified. Mild bibasilar atelectasis shows no
significant change. Cardiomegaly stable.
IMPRESSION: Mild bibasilar atelectasis, without significant change. No
pneumothorax visualized.

## 2015-12-06 IMAGING — CR DG CHEST 1V PORT
1 series · 1 of 1 positions shown · non-contrast
Comparison: 09/07/2014

CLINICAL DATA: S/P CABG x 4, right side chest tube removal this
morningHx of HTN, alcohol abuse, anginal pain, SOB dyspnea, Marius Buflea

EXAM:
PORTABLE CHEST - 1 VIEW

[AP]
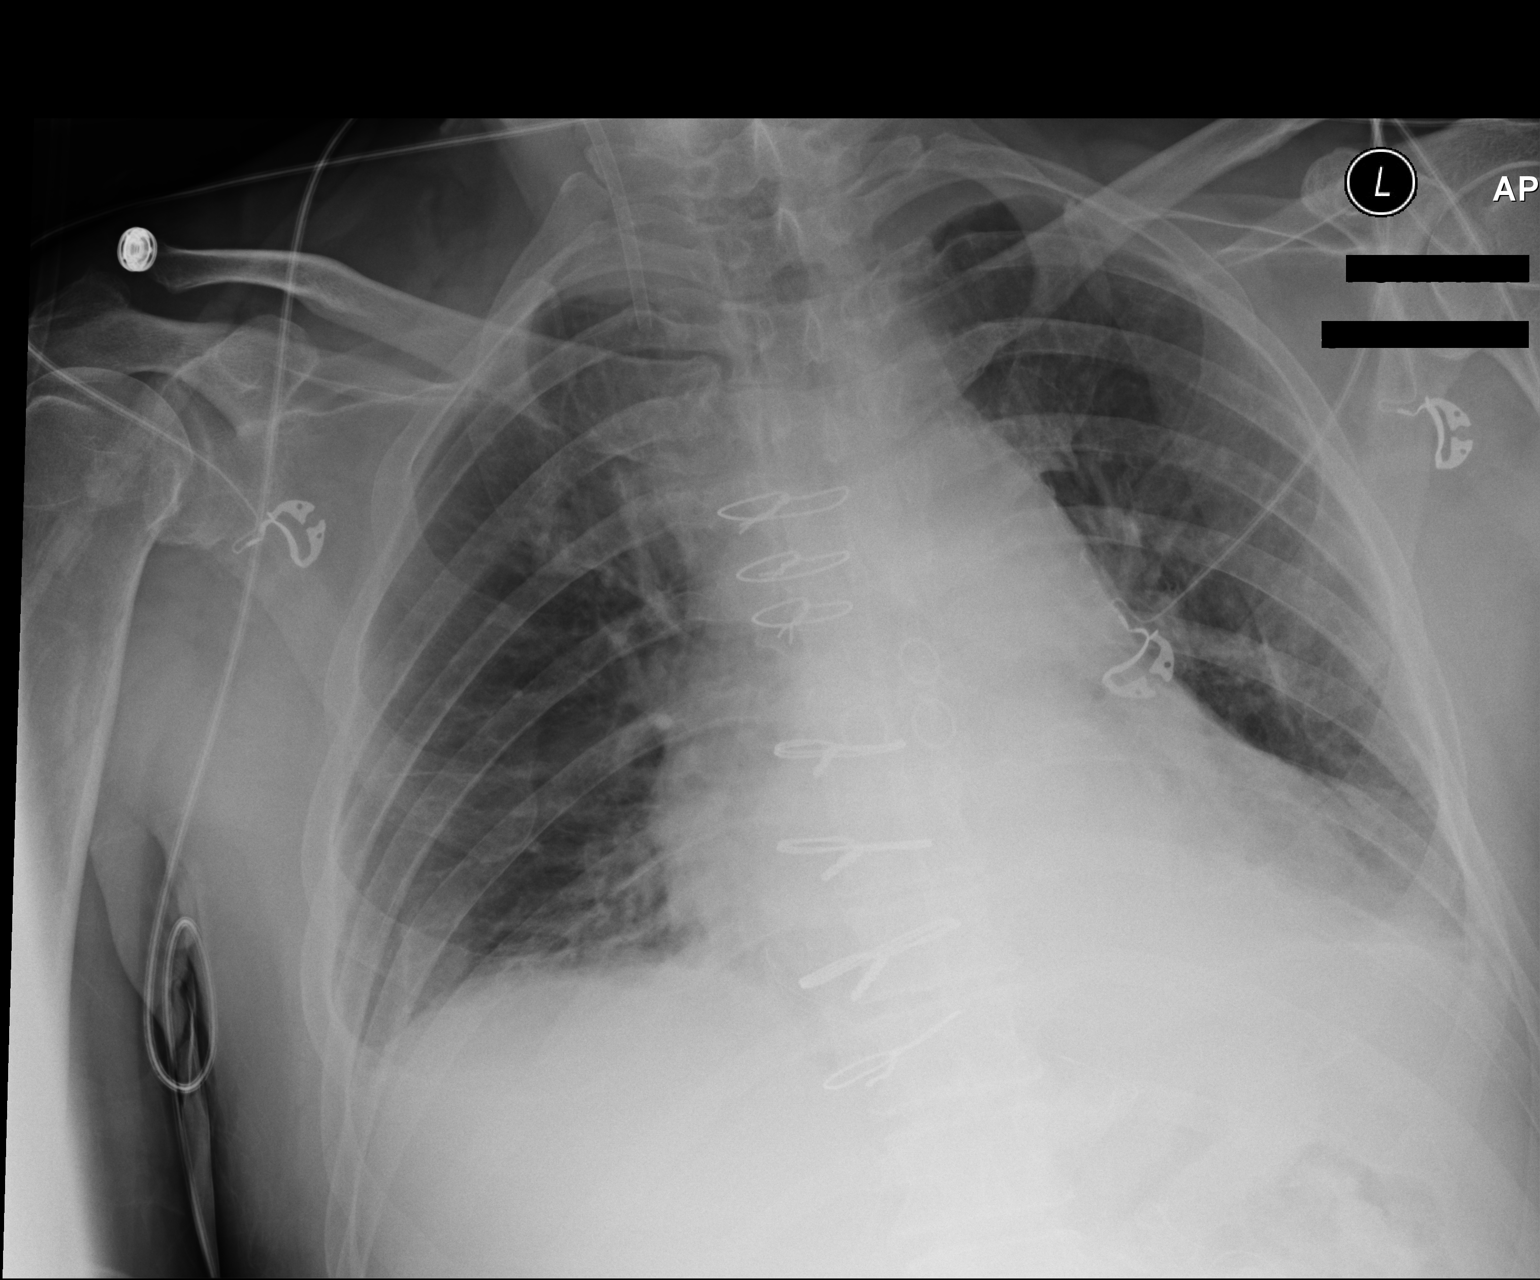

[1 of 1 positions shown; findings below may reference images not displayed]

FINDINGS: Since the previous day study, all lines and tubes have been removed
with the exception of the right internal jugular Cordis.

No pneumothorax. No pulmonary edema. There is no mediastinal
widening.

There is persistent basilar opacity consistent with atelectasis
likely with associated small effusions.

No new areas of lung opacity.
IMPRESSION: Status post CABG surgery. No evidence of an operative complication.
Mild persistent lung base atelectasis. No pulmonary edema or
mediastinal widening. No pneumothorax.

## 2015-12-07 IMAGING — DX DG CHEST 2V
2 series · 2 of 2 positions shown · non-contrast
Comparison: 09/08/2014

CLINICAL DATA: Post CABG.

EXAM:
CHEST  2 VIEW

[chest pa]
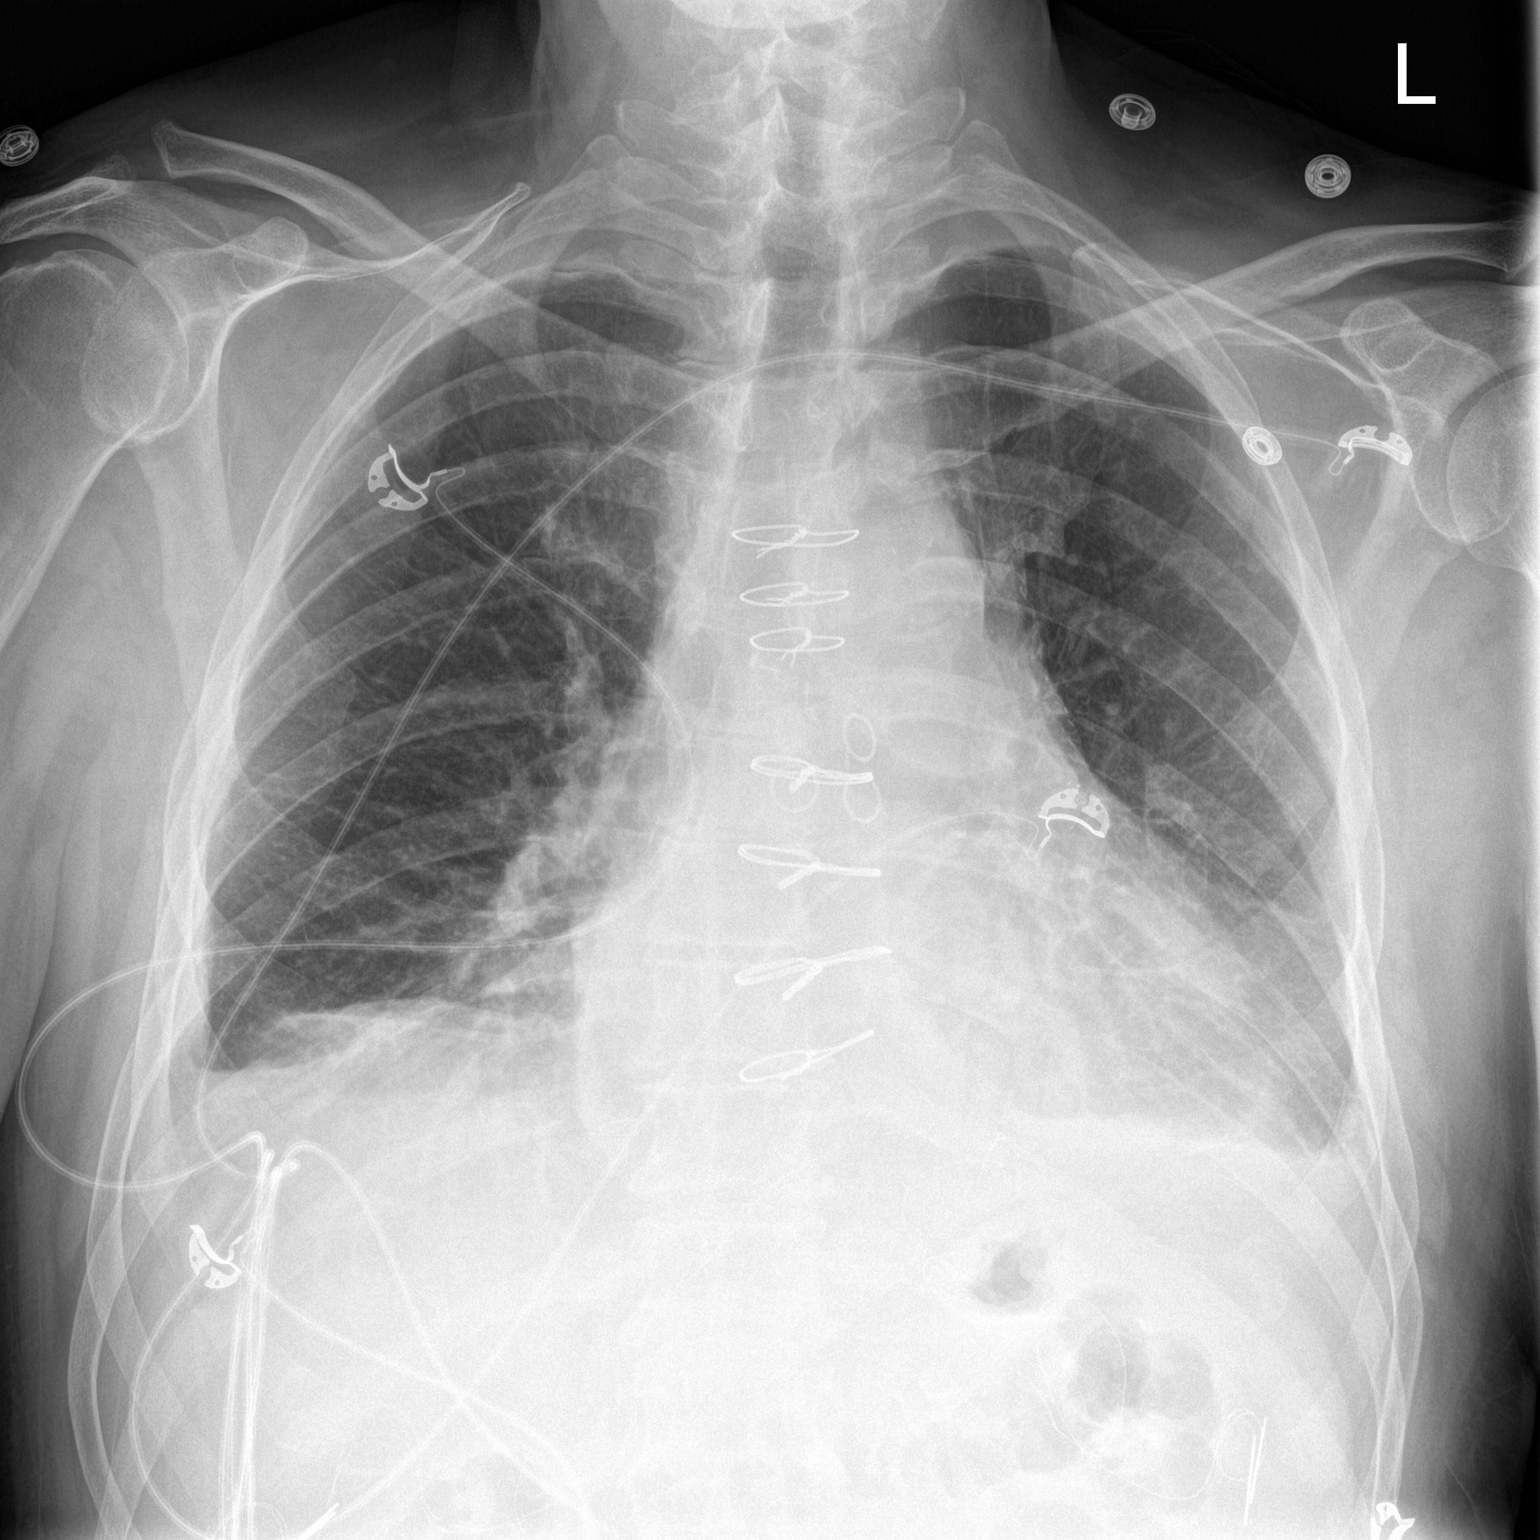

[chest lat]
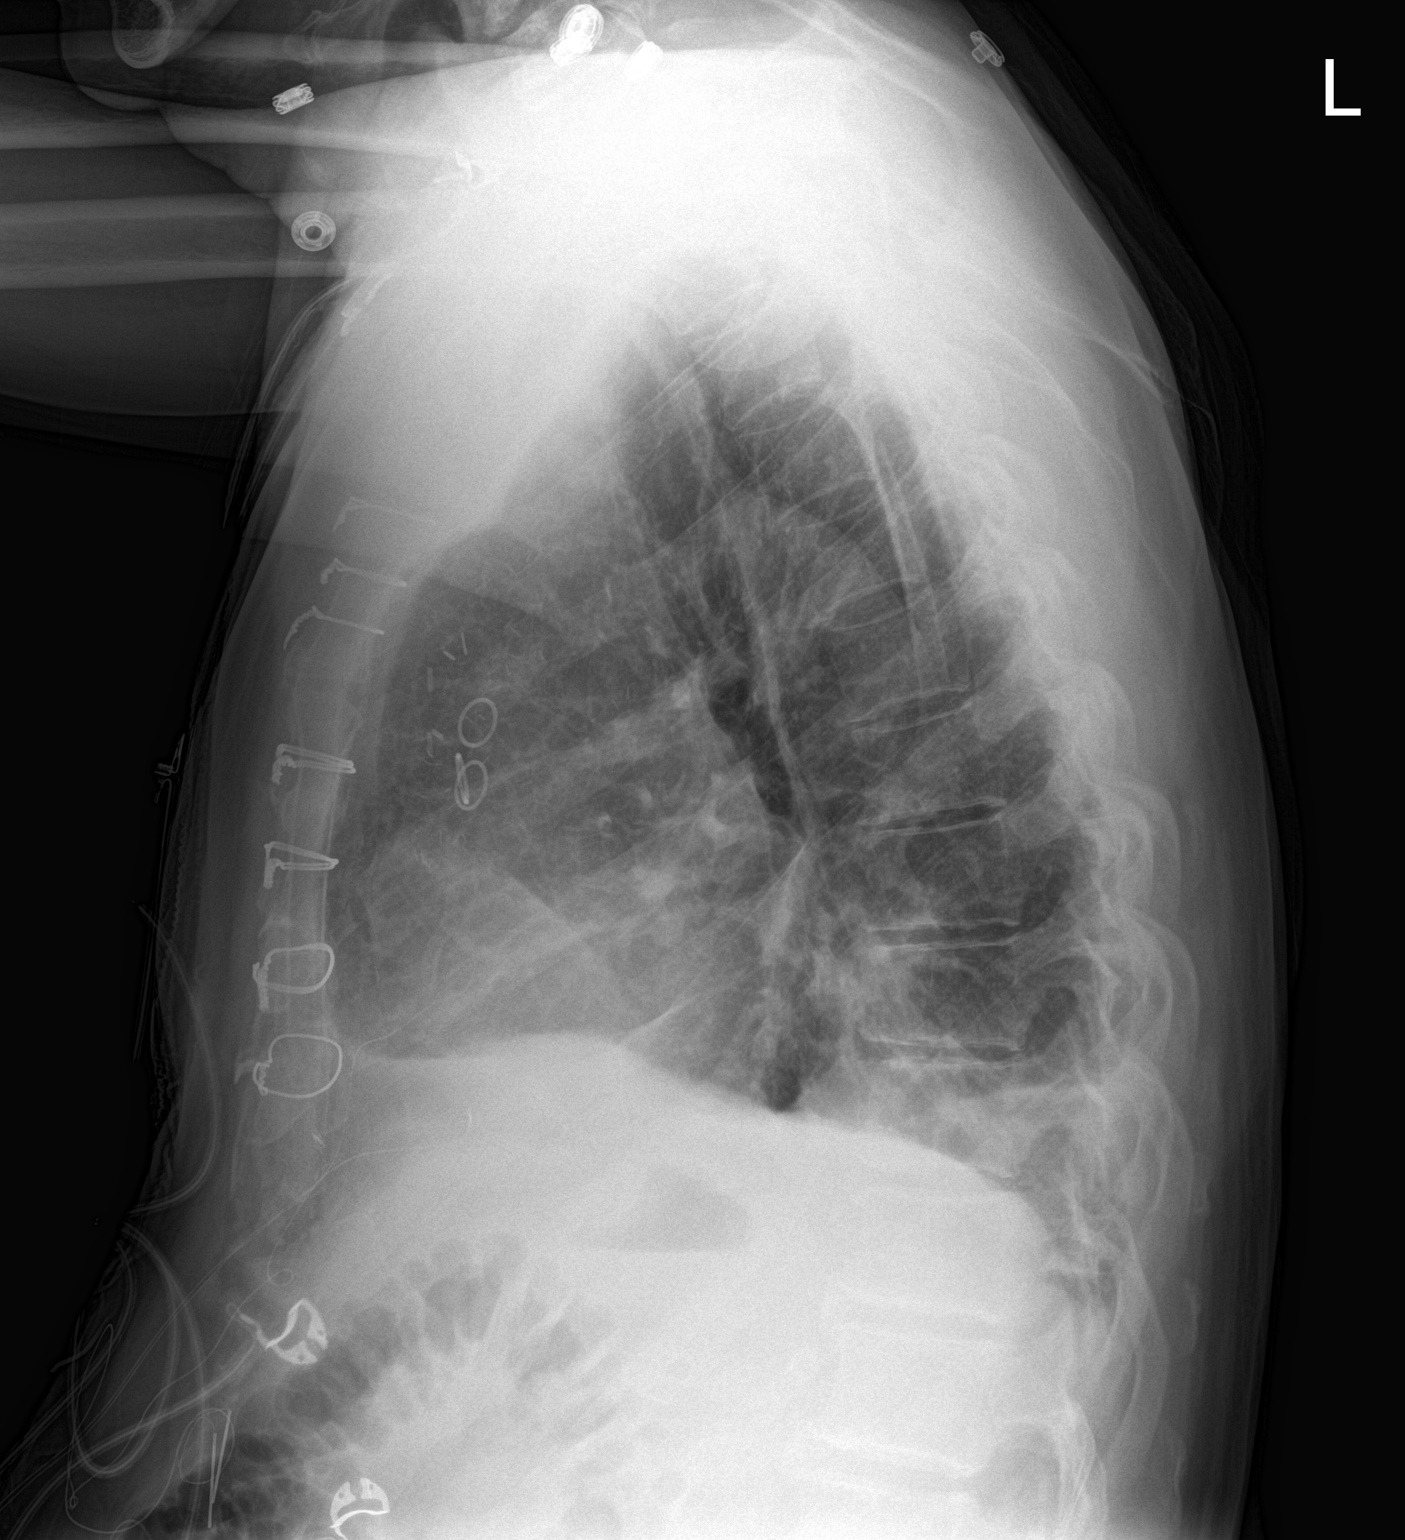

[2 of 2 positions shown; findings below may reference images not displayed]

FINDINGS: The right jugular central venous catheter has been removed. Median
sternotomy wires with post CABG changes. Epicardial pacer wires are
present. Small bilateral pleural effusions with basilar atelectasis.
Negative for a pneumothorax. Heart size is mildly enlarged.
IMPRESSION: Bibasilar chest densities are most compatible with small pleural
effusions and atelectasis.

## 2016-05-02 ENCOUNTER — Other Ambulatory Visit: Payer: Self-pay | Admitting: Internal Medicine

## 2016-06-15 ENCOUNTER — Encounter: Payer: Self-pay | Admitting: Cardiology

## 2016-06-23 NOTE — Progress Notes (Signed)
Sinus rhythm at a rate of 61. No ST changes.      HPI: FU CAD. Previously followed by Dr Mare Ferrari. Also with h/o HTN, dyslipidemia, remote tobacco abuse, and posterior circulation CVA (no obvious cause, no documented AF-Rx'd with ASA/Plavix); found to have severe three vessel coronary artery disease with preserved LVF at cath 08/29/14. He underwent CABG x 4 on 09/06/14 (LIMA to the LAD, saphenous vein graft to the ramus intermedius, saphenous vein graft to the first obtuse marginal and saphenous vein graft to the right coronary artery). Preoperative echocardiogram December 2015 showed normal LV function and grade 1 diastolic dysfunction. Preoperative carotid Dopplers December 2015 showed 1-39% bilateral stenosis. He was to be discharged 09/09/14 but had PAF. Now treated with apixaban. Since last seen, the patient has dyspnea with more extreme activities but not with routine activities. It is relieved with rest. It is not associated with chest pain. There is no orthopnea, PND or pedal edema. There is no syncope or palpitations. There is no exertional chest pain.   Current Outpatient Prescriptions  Medication Sig Dispense Refill  . acetaminophen (TYLENOL) 325 MG tablet Take 2 tablets (650 mg total) by mouth every 4 (four) hours as needed for headache or mild pain.    Marland Kitchen atorvastatin (LIPITOR) 20 MG tablet TAKE 1 TABLET (20 MG TOTAL) BY MOUTH DAILY. 90 tablet 3  . CIALIS 5 MG tablet TAKE 1 TABLET (5 MG TOTAL) BY MOUTH DAILY AS NEEDED FOR ERECTILE DYSFUNCTION. 30 tablet 3  . ELIQUIS 5 MG TABS tablet TAKE 1 TABLET (5 MG TOTAL) BY MOUTH 2 (TWO) TIMES DAILY. 60 tablet 1  . metoprolol tartrate (LOPRESSOR) 25 MG tablet TAKE 1.5 TABLETS (37.5 MG TOTAL) BY MOUTH 2 (TWO) TIMES DAILY. 90 tablet 7  . NITROSTAT 0.4 MG SL tablet Place 1 tablet under the tongue as needed for chest pain.   2   No current facility-administered medications for this visit.      Past Medical History:  Diagnosis Date  . Alcohol  abuse, unspecified   . Anginal pain ([place])   . Arthritis    in back and shoulder  . H/O renal calculi   . History of kidney stones   . Hyperlipidemia    preTx 114, postTx 47 LDL  . Hypertension   . Shortness of breath dyspnea    with exertion  . Stroke [facility])    August '11 x 2: L. cerebellar, lateral medulla, ol rd lacunar infarcts;  2nd left pontine infarct.    Past Surgical History:  Procedure Laterality Date  . BACK SURGERY  1986  . CORONARY ARTERY BYPASS GRAFT N/A 09/06/2014   Procedure: CORONARY ARTERY BYPASS GRAFTING (CABG);  Surgeon: Ivin Poot, MD;  Location: [place];  Service: Open Heart Surgery;  Laterality: N/A;  CABG times 4, using internal mammary artery, and right leg saphenous vein harvested endoscopically  . LEFT HEART CATHETERIZATION WITH CORONARY ANGIOGRAM N/A 08/29/2014   Procedure: LEFT HEART CATHETERIZATION WITH CORONARY ANGIOGRAM;  Surgeon: Lorretta Harp, MD;  Location: [facility] CATH LAB;  Service: Cardiovascular;  Laterality: N/A;  . LUMBAR DISC SURGERY  1986   Dr. Juanetta Snow  . ROTATOR CUFF REPAIR  06/10/12   at [place]...left shoulder  . TEE WITHOUT CARDIOVERSION N/A 09/06/2014   Procedure: TRANSESOPHAGEAL ECHOCARDIOGRAM (TEE);  Surgeon: Ivin Poot, MD;  Location: [place];  Service: Open Heart Surgery;  Laterality: N/A;  . TONSILLECTOMY  1954  . TONSILLECTOMY      Social History  Social History  . Marital status: Married    Spouse name: N/A  . Number of children: 2  . Years of education: 12   Occupational History  . retail sales - paint    Social History Main Topics  . Smoking status: Former Smoker    Packs/day: 1.00    Years: 15.00    Types: Cigarettes    Quit date: 09/27/1978  . Smokeless tobacco: Never Used  . Alcohol use 3.6 oz/week    6 Cans of beer per week     Comment: stopped drinking after stroke. Had been drink 6 pk per day plus binge drinking  . Drug use: No  . Sexual activity: Yes    Partners: Female   Other  Topics Concern  . Not on file   Social History Narrative   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 Married '79. 1 dtr- '68, 1 step son. 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 Marriage in good health. He reports he is very strong in his faith.     Family History  Problem Relation Age of Onset  . Heart disease Father   . Hypertension Father   . Heart attack Father   . GI problems Mother   . Cancer Neg Hx   . Diabetes Neg Hx   . Early death Neg Hx   . Hearing loss Neg Hx   . Hyperlipidemia Neg Hx   . Kidney disease Neg Hx   . Stroke Neg Hx   . Alcohol abuse Neg Hx     ROS: no fevers or chills, productive cough, hemoptysis, dysphasia, odynophagia, melena, hematochezia, dysuria, hematuria, rash, seizure activity, orthopnea, PND, pedal edema, claudication. Remaining systems are negative.  Physical Exam: Well-developed well-nourished in no acute distress.  Skin is warm and dry.  HEENT is normal.  Neck is supple.  Chest is clear to auscultation with normal expansion.  Cardiovascular exam is regular rate and rhythm.  Abdominal exam nontender or distended. No masses palpated. Extremities show no edema. neuro grossly intact  ECG-Sinus rhythm at a rate of 61. No ST changes.  A/P  1 coronary artery disease- Status post coronary artery bypass and graft. Continue aspirin and statin.   2 bruit-schedule abdominal ultrasound to exclude aneurysm.   3 hyperlipidemia-continue statin. Check lipids and liver.  He declined increasing lipitor to 80 mg daily.  4 hypertension-blood pressure is borderline. He will follow this at home and we will adjust regimen as needed.   5 paroxysmal atrial fibrillation-patient remains in sinus rhythm. Continue metoprolol. Continue Apixaban. Check hemoglobin and renal function.   6 history of cryptogenic stroke  Kirk Ruths, MD

## 2016-06-28 ENCOUNTER — Encounter: Payer: Self-pay | Admitting: Cardiology

## 2016-06-28 ENCOUNTER — Ambulatory Visit (INDEPENDENT_AMBULATORY_CARE_PROVIDER_SITE_OTHER): Payer: Commercial Managed Care - HMO | Admitting: Cardiology

## 2016-06-28 VITALS — BP 142/90 | HR 61 | Ht 73.0 in | Wt 180.0 lb

## 2016-06-28 DIAGNOSIS — R0989 Other specified symptoms and signs involving the circulatory and respiratory systems: Secondary | ICD-10-CM

## 2016-06-28 DIAGNOSIS — I480 Paroxysmal atrial fibrillation: Secondary | ICD-10-CM | POA: Diagnosis not present

## 2016-06-28 DIAGNOSIS — E785 Hyperlipidemia, unspecified: Secondary | ICD-10-CM | POA: Diagnosis not present

## 2016-06-28 DIAGNOSIS — I2510 Atherosclerotic heart disease of native coronary artery without angina pectoris: Secondary | ICD-10-CM

## 2016-06-28 NOTE — Patient Instructions (Signed)
Medication Instructions:   NO CHANGE  Labwork:  Your physician recommends that you return for lab work WHEN FASTING  Testing/Procedures:  Your physician has requested that you have an abdominal aorta duplex. During this test, an ultrasound is used to evaluate the aorta. Allow 30 minutes for this exam. Do not eat after midnight the day before and avoid carbonated beverages   Follow-Up:  Your physician wants you to follow-up in: [place] will receive a reminder letter in the mail two months in advance. If you don't receive a letter, please call our office to schedule the follow-up appointment.   If you need a refill on your cardiac medications before your next appointment, please call your pharmacy.

## 2016-07-06 ENCOUNTER — Encounter (HOSPITAL_COMMUNITY): Payer: Commercial Managed Care - HMO

## 2016-07-12 ENCOUNTER — Other Ambulatory Visit (HOSPITAL_COMMUNITY): Payer: Self-pay | Admitting: Family Medicine

## 2016-07-12 DIAGNOSIS — Z136 Encounter for screening for cardiovascular disorders: Secondary | ICD-10-CM

## 2016-07-19 ENCOUNTER — Ambulatory Visit (HOSPITAL_COMMUNITY)
Admission: RE | Admit: 2016-07-19 | Discharge: 2016-07-19 | Disposition: A | Discharge status: Home / Self Care | Payer: Commercial Managed Care - HMO | Source: Ambulatory Visit | Attending: Diagnostic Radiology | Admitting: Diagnostic Radiology

## 2016-07-19 DIAGNOSIS — I700 Atherosclerosis of aorta: Secondary | ICD-10-CM | POA: Insufficient documentation

## 2016-07-19 DIAGNOSIS — Z87891 Personal history of nicotine dependence: Secondary | ICD-10-CM | POA: Diagnosis not present

## 2016-07-19 DIAGNOSIS — Z136 Encounter for screening for cardiovascular disorders: Secondary | ICD-10-CM | POA: Diagnosis not present

## 2016-07-19 DIAGNOSIS — I10 Essential (primary) hypertension: Secondary | ICD-10-CM | POA: Diagnosis not present

## 2016-07-19 DIAGNOSIS — I708 Atherosclerosis of other arteries: Secondary | ICD-10-CM | POA: Insufficient documentation

## 2016-07-19 DIAGNOSIS — Z951 Presence of aortocoronary bypass graft: Secondary | ICD-10-CM | POA: Insufficient documentation

## 2016-07-19 DIAGNOSIS — Z8673 Personal history of transient ischemic attack (TIA), and cerebral infarction without residual deficits: Secondary | ICD-10-CM | POA: Insufficient documentation

## 2016-07-19 DIAGNOSIS — E785 Hyperlipidemia, unspecified: Secondary | ICD-10-CM | POA: Insufficient documentation

## 2016-07-24 ENCOUNTER — Other Ambulatory Visit: Payer: Self-pay | Admitting: Physician Assistant

## 2016-07-27 ENCOUNTER — Telehealth: Payer: Self-pay | Admitting: Cardiology

## 2016-07-27 NOTE — Telephone Encounter (Signed)
Spoke with pt, questions and results of abdominal US given

## 2016-07-27 NOTE — Telephone Encounter (Signed)
New Message ° °Pt call requesting to speak with RN about test results. Please call back to discuss  °

## 2016-07-28 ENCOUNTER — Telehealth: Payer: Self-pay | Admitting: *Deleted

## 2016-07-28 ENCOUNTER — Telehealth: Payer: Self-pay | Admitting: Cardiology

## 2016-07-28 MED ORDER — APIXABAN 5 MG PO TABS: 5.0000 mg | ORAL_TABLET | Freq: Two times a day (BID) | ORAL | 0 refills | Status: DC

## 2016-07-28 NOTE — Telephone Encounter (Signed)
Rec'd call pt is requesting about his eliquis. Inform pt per chart Dr. Stanford Breed office has approved & sent to [facility].../lmb

## 2016-07-28 NOTE — Telephone Encounter (Signed)
New message   Pt c/o medication issue:  1. Name of Medication: ELIQUIS 5 MG TABS tablet  2. How are you currently taking this medication (dosage and times per day)? 5 mg   3. Are you having a reaction (difficulty breathing--STAT)? No   4. What is your medication issue? 90 day called in $ 588.00  Patient reach doughnut hole with insurance . No eliquis looking for options or samples.

## 2016-07-28 NOTE — Telephone Encounter (Signed)
PATIENT AWARE SAMPLE AVAILABLE FOR PICK UP - 2 WEEKS WORTH.  ALSO  30 DAY FREE SAVING CARD GIVEN FOR USE. PATIENT IS AWARE TO CALL BACK - WILL TRY TO COVER UNTIL THE END OF THE YEAR.

## 2016-08-06 ENCOUNTER — Encounter: Payer: Self-pay | Admitting: *Deleted

## 2016-08-09 ENCOUNTER — Other Ambulatory Visit: Payer: Self-pay

## 2016-08-09 MED ORDER — METOPROLOL TARTRATE 25 MG PO TABS: ORAL_TABLET | ORAL | 10 refills | Status: DC

## 2016-08-12 ENCOUNTER — Ambulatory Visit (INDEPENDENT_AMBULATORY_CARE_PROVIDER_SITE_OTHER): Payer: Commercial Managed Care - HMO | Admitting: Internal Medicine

## 2016-08-12 ENCOUNTER — Encounter: Payer: Self-pay | Admitting: Internal Medicine

## 2016-08-12 ENCOUNTER — Other Ambulatory Visit (INDEPENDENT_AMBULATORY_CARE_PROVIDER_SITE_OTHER): Payer: Commercial Managed Care - HMO

## 2016-08-12 VITALS — BP 124/72 | HR 65 | Temp 97.7°F | Resp 16 | Ht 73.0 in | Wt 185.0 lb

## 2016-08-12 DIAGNOSIS — M21371 Foot drop, right foot: Secondary | ICD-10-CM

## 2016-08-12 DIAGNOSIS — R2681 Unsteadiness on feet: Secondary | ICD-10-CM | POA: Diagnosis not present

## 2016-08-12 DIAGNOSIS — N400 Enlarged prostate without lower urinary tract symptoms: Secondary | ICD-10-CM

## 2016-08-12 DIAGNOSIS — E785 Hyperlipidemia, unspecified: Secondary | ICD-10-CM | POA: Diagnosis not present

## 2016-08-12 DIAGNOSIS — Z0000 Encounter for general adult medical examination without abnormal findings: Secondary | ICD-10-CM

## 2016-08-12 DIAGNOSIS — I10 Essential (primary) hypertension: Secondary | ICD-10-CM

## 2016-08-12 DIAGNOSIS — I480 Paroxysmal atrial fibrillation: Secondary | ICD-10-CM

## 2016-08-12 LAB — COMPREHENSIVE METABOLIC PANEL
ALBUMIN: 4.3 g/dL (ref 3.5–5.2)
ALK PHOS: 54 U/L (ref 39–117)
ALT: 30 U/L (ref 0–53)
AST: 32 U/L (ref 0–37)
BUN: 11 mg/dL (ref 6–23)
CALCIUM: 9.4 mg/dL (ref 8.4–10.5)
CO2: 29 mEq/L (ref 19–32)
Chloride: 104 mEq/L (ref 96–112)
Creatinine, Ser: 0.69 mg/dL (ref 0.40–1.50)
GFR: 120.76 mL/min (ref >60.00)
Glucose, Bld: 93 mg/dL (ref 70–99)
POTASSIUM: 4.4 meq/L (ref 3.5–5.1)
SODIUM: 140 meq/L (ref 135–145)
TOTAL PROTEIN: 6.9 g/dL (ref 6.0–8.3)
Total Bilirubin: 0.8 mg/dL (ref 0.2–1.2)

## 2016-08-12 LAB — URINALYSIS, ROUTINE W REFLEX MICROSCOPIC
BILIRUBIN URINE: NEGATIVE
KETONES UR: NEGATIVE
LEUKOCYTES UA: NEGATIVE
Nitrite: NEGATIVE
PH: 5 (ref 5.0–8.0)
RBC / HPF: NONE SEEN (ref 0–?)
SPECIFIC GRAVITY, URINE: 1.025 (ref 1.000–1.030)
TOTAL PROTEIN, URINE-UPE24: NEGATIVE
UROBILINOGEN UA: 0.2 (ref 0.0–1.0)
Urine Glucose: NEGATIVE

## 2016-08-12 LAB — LIPID PANEL
CHOLESTEROL: 141 mg/dL (ref 0–200)
HDL: 61.3 mg/dL (ref >39.00)
LDL Cholesterol: 67 mg/dL (ref 0–99)
NonHDL: 79.59
TRIGLYCERIDES: 64 mg/dL (ref 0.0–149.0)
Total CHOL/HDL Ratio: 2
VLDL: 12.8 mg/dL (ref 0.0–40.0)

## 2016-08-12 LAB — CBC WITH DIFFERENTIAL/PLATELET
Basophils Absolute: 0 10*3/uL (ref 0.0–0.1)
Basophils Relative: 0.3 % (ref 0.0–3.0)
EOS PCT: 2 % (ref 0.0–5.0)
Eosinophils Absolute: 0.1 10*3/uL (ref 0.0–0.7)
HCT: 45.5 % (ref 39.0–52.0)
HEMOGLOBIN: 15.3 g/dL (ref 13.0–17.0)
Lymphocytes Relative: 31.4 % (ref 12.0–46.0)
Lymphs Abs: 2.2 10*3/uL (ref 0.7–4.0)
MCHC: 33.6 g/dL (ref 30.0–36.0)
MCV: 92.8 fl (ref 78.0–100.0)
MONO ABS: 0.5 10*3/uL (ref 0.1–1.0)
Monocytes Relative: 7.6 % (ref 3.0–12.0)
Neutro Abs: 4.2 10*3/uL (ref 1.4–7.7)
Neutrophils Relative %: 58.7 % (ref 43.0–77.0)
Platelets: 198 10*3/uL (ref 150.0–400.0)
RBC: 4.91 Mil/uL (ref 4.22–5.81)
RDW: 12.9 % (ref 11.5–15.5)
WBC: 7.2 10*3/uL (ref 4.0–10.5)

## 2016-08-12 LAB — PSA: PSA: 2.14 ng/mL (ref 0.10–4.00)

## 2016-08-12 LAB — TSH: TSH: 1.38 u[IU]/mL (ref 0.35–4.50)

## 2016-08-12 MED ORDER — ZOSTER VACCINE LIVE 19400 UNT/0.65ML ~~LOC~~ SUSR: 0.6500 mL | Freq: Once | SUBCUTANEOUS | 0 refills | Status: AC

## 2016-08-12 NOTE — Patient Instructions (Signed)

## 2016-08-12 NOTE — Progress Notes (Signed)
Subjective:  Patient ID: [name], male    DOB: Apr 23, 1946  Age: 70 y.o. MRN: [id number]  CC: Annual Exam; Hyperlipidemia; and Hypertension   HPI [name] presents for an AWV/CPX.  He has a prior history of CVA and over the last 2 years has had worsening difficulty using his right lower extremity. He describes a weakness, a clumsiness and a foot drop. He wants to see a physical therapist to see if there are any options to help improve with this.  He tells me his blood pressure has been well controlled with metoprolol and he denies any recent episodes of headache/blurred vision/chest pain/shortness of breath/palpitations/edema/or fatigue.  He is tolerating atorvastatin well with no muscle or joint aches.  Past Medical History:  Diagnosis Date  . Alcohol abuse, unspecified   . Anginal pain ([place])   . Arthritis    in back and shoulder  . H/O renal calculi   . History of kidney stones   . Hyperlipidemia    preTx 114, postTx 47 LDL  . Hypertension   . Shortness of breath dyspnea    with exertion  . Stroke [facility])    August '11 x 2: L. cerebellar, lateral medulla, ol rd lacunar infarcts;  2nd left pontine infarct.   Past Surgical History:  Procedure Laterality Date  . BACK SURGERY  1986  . CORONARY ARTERY BYPASS GRAFT N/A 09/06/2014   Procedure: CORONARY ARTERY BYPASS GRAFTING (CABG);  Surgeon: Ivin Poot, MD;  Location: [place];  Service: Open Heart Surgery;  Laterality: N/A;  CABG times 4, using internal mammary artery, and right leg saphenous vein harvested endoscopically  . LEFT HEART CATHETERIZATION WITH CORONARY ANGIOGRAM N/A 08/29/2014   Procedure: LEFT HEART CATHETERIZATION WITH CORONARY ANGIOGRAM;  Surgeon: Lorretta Harp, MD;  Location: [facility] CATH LAB;  Service: Cardiovascular;  Laterality: N/A;  . LUMBAR DISC SURGERY  1986   Dr. Juanetta Snow  . ROTATOR CUFF REPAIR  06/10/12   at [facility]...left shoulder  . TEE WITHOUT CARDIOVERSION N/A  09/06/2014   Procedure: TRANSESOPHAGEAL ECHOCARDIOGRAM (TEE);  Surgeon: Ivin Poot, MD;  Location: [place];  Service: Open Heart Surgery;  Laterality: N/A;  . TONSILLECTOMY  1954  . TONSILLECTOMY      reports that he quit smoking about 37 years ago. His smoking use included Cigarettes. He has a 15.00 pack-year smoking history. He has never used smokeless tobacco. He reports that he drinks about 3.6 oz of alcohol per week . He reports that he does not use drugs. family history includes GI problems in his mother; Heart attack in his father; Heart disease in his father; Hypertension in his father. No Known Allergies  Outpatient Medications Prior to Visit  Medication Sig Dispense Refill  . acetaminophen (TYLENOL) 325 MG tablet Take 2 tablets (650 mg total) by mouth every 4 (four) hours as needed for headache or mild pain.    Marland Kitchen apixaban (ELIQUIS) 5 MG TABS tablet Take 1 tablet (5 mg total) by mouth 2 (two) times daily. 28 tablet 0  . atorvastatin (LIPITOR) 20 MG tablet TAKE 1 TABLET (20 MG TOTAL) BY MOUTH DAILY. 90 tablet 3  . CIALIS 5 MG tablet TAKE 1 TABLET (5 MG TOTAL) BY MOUTH DAILY AS NEEDED FOR ERECTILE DYSFUNCTION. 30 tablet 3  . metoprolol tartrate (LOPRESSOR) 25 MG tablet TAKE 1.5 TABLETS (37.5 MG TOTAL) BY MOUTH 2 (TWO) TIMES DAILY. 90 tablet 10  . NITROSTAT 0.4 MG SL tablet Place 1 tablet under  the tongue as needed for chest pain.   2   No facility-administered medications prior to visit.     ROS Review of Systems  Constitutional: Negative for activity change, appetite change, diaphoresis, fatigue and unexpected weight change.  HENT: Negative.   Eyes: Negative for visual disturbance.  Respiratory: Negative for cough, choking, chest tightness, shortness of breath, wheezing and stridor.   Cardiovascular: Negative for chest pain, palpitations and leg swelling.  Gastrointestinal: Negative for abdominal distention, abdominal pain, constipation, diarrhea, nausea and vomiting.    Endocrine: Negative.   Genitourinary: Negative.  Negative for difficulty urinating, discharge, dysuria, frequency, hematuria, penile pain, penile swelling, scrotal swelling, testicular pain and urgency.  Musculoskeletal: Positive for gait problem. Negative for arthralgias, back pain, joint swelling, myalgias and neck pain.  Skin: Negative.   Allergic/Immunologic: Negative.   Neurological: Positive for weakness and numbness. Negative for dizziness, tremors, seizures, syncope, speech difficulty, light-headedness and headaches.  Hematological: Negative.  Negative for adenopathy. Does not bruise/bleed easily.  Psychiatric/Behavioral: Negative.     Objective:  BP 124/72 (BP Location: Left Arm, Patient Position: Sitting, Cuff Size: Normal)   Pulse 65   Temp 97.7 F (36.5 C) (Oral)   Resp 16   Ht 6\' 1"  (1.854 m)   Wt 185 lb (83.9 kg)   SpO2 97%   BMI 24.41 kg/m   BP Readings from Last 3 Encounters:  08/12/16 124/72  06/28/16 (!) 142/90  11/17/15 140/74    Wt Readings from Last 3 Encounters:  08/12/16 185 lb (83.9 kg)  06/28/16 180 lb (81.6 kg)  11/17/15 187 lb 12.8 oz (85.2 kg)    Physical Exam  Constitutional: He appears well-developed and well-nourished. No distress.  HENT:  Mouth/Throat: Oropharynx is clear and moist. No oropharyngeal exudate.  Eyes: Conjunctivae are normal. Right eye exhibits no discharge. Left eye exhibits no discharge. No scleral icterus.  Neck: Normal range of motion. Neck supple. No JVD present. No tracheal deviation present. No thyromegaly present.  Cardiovascular: Normal rate, regular rhythm, normal heart sounds and intact distal pulses.  Exam reveals no gallop and no friction rub.   No murmur heard. Pulmonary/Chest: Effort normal and breath sounds normal. No stridor. No respiratory distress. He has no wheezes. He has no rales. He exhibits no tenderness.  Abdominal: Soft. Bowel sounds are normal. He exhibits no distension and no mass. There is no  tenderness. There is no rebound and no guarding. Hernia confirmed negative in the right inguinal area and confirmed negative in the left inguinal area.  Genitourinary: Rectum normal, testes normal and penis normal. Rectal exam shows no external hemorrhoid, no internal hemorrhoid, no fissure, no mass, no tenderness, anal tone normal and guaiac negative stool. Prostate is enlarged (1+ smooth symm BPH). Prostate is not tender. Right testis shows no mass, no swelling and no tenderness. Right testis is descended. Left testis shows no mass, no swelling and no tenderness. Left testis is descended. Circumcised. No penile erythema or penile tenderness. No discharge found.  Lymphadenopathy:    He has no cervical adenopathy.       Right: No inguinal adenopathy present.       Left: No inguinal adenopathy present.  Neurological: He displays atrophy and abnormal reflex. He displays no tremor. A sensory deficit is present. No cranial nerve deficit. He exhibits abnormal muscle tone. He displays no seizure activity. Coordination and gait abnormal.  Reflex Scores:      Tricep reflexes are 1+ on the right side and 1+ on the left  side.      Bicep reflexes are 1+ on the right side and 1+ on the left side.      Brachioradialis reflexes are 1+ on the right side and 1+ on the left side.      Patellar reflexes are 2+ on the right side and 1+ on the left side.      Achilles reflexes are 2+ on the right side and 1+ on the left side. RLE weakness and hyperreflexia  Skin: He is not diaphoretic.  Vitals reviewed.   Lab Results  Component Value Date   WBC 7.2 08/12/2016   HGB 15.3 08/12/2016   HCT 45.5 08/12/2016   PLT 198.0 08/12/2016   GLUCOSE 93 08/12/2016   CHOL 141 08/12/2016   TRIG 64.0 08/12/2016   HDL 61.30 08/12/2016   LDLCALC 67 08/12/2016   ALT 30 08/12/2016   AST 32 08/12/2016   NA 140 08/12/2016   K 4.4 08/12/2016   CL 104 08/12/2016   CREATININE 0.69 08/12/2016   BUN 11 08/12/2016   CO2 29  08/12/2016   TSH 1.38 08/12/2016   PSA 2.14 08/12/2016   INR 1.5 12/02/2014   HGBA1C 5.7 (H) 09/05/2014    No results found.  Assessment & Plan:   Karar was seen today for annual exam, hyperlipidemia and hypertension.  Diagnoses and all orders for this visit:  Essential hypertension- His blood pressure is well-controlled, electrolytes and renal function are stable. -     Comprehensive metabolic panel; Future -     CBC with Differential/Platelet; Future -     Urinalysis, Routine w reflex microscopic (not at [facility]); Future  Benign prostatic hyperplasia without lower urinary tract symptoms- his PSA is not elevated so not concerned about prostate cancer. He has no symptoms related this that he wants to have treated. -     PSA; Future -     Urinalysis, Routine w reflex microscopic (not at [facility]); Future  Hyperlipidemia with target LDL less than 100- he is achieved his LDL goal is doing well on the statin. -     Lipid panel; Future -     TSH; Future  Routine general medical examination at a health care facility -     Zoster Vaccine Live, PF, (ZOSTAVAX) [address]454 UNT/0.65ML injection; Inject 19,400 Units into the skin once.  Gait instability -     Ambulatory referral to Physical Therapy  Right foot drop -     Ambulatory referral to Physical Therapy  PAF- CHADVASC -5- he has good rate and rhythm control, will continue anticoagulation with eliquis and rate control with metoprolol   I am having Mr. Thorndyke start on Zoster Vaccine Live (PF). I am also having him maintain his acetaminophen, NITROSTAT, CIALIS, atorvastatin, apixaban, and metoprolol tartrate.  Meds ordered this encounter  Medications  . Zoster Vaccine Live, PF, (ZOSTAVAX) [id number] UNT/0.65ML injection    Sig: Inject 19,400 Units into the skin once.    Dispense:  1 each    Refill:  0   See AVS for instructions about healthy living and anticipatory guidance.  Follow-up: Return in about 3 months (around 11/12/2016).  Scarlette Calico, MD

## 2016-08-12 NOTE — Progress Notes (Signed)
Pre visit review using our clinic review tool, if applicable. No additional management support is needed unless otherwise documented below in the visit note. 

## 2016-08-13 ENCOUNTER — Telehealth: Payer: Self-pay | Admitting: Cardiology

## 2016-08-13 NOTE — Telephone Encounter (Signed)
Spoke with pt, aware will forward labs for dr crenshaw's review.

## 2016-08-13 NOTE — Telephone Encounter (Signed)
New message  Pt call requesting to speak with RN to inform his Lab work was completed in Dr. Scarlette Calico office. Please call back to discuss if needed

## 2016-08-15 NOTE — Assessment & Plan Note (Signed)

## 2016-08-24 ENCOUNTER — Telehealth: Payer: Self-pay | Admitting: Internal Medicine

## 2016-08-24 NOTE — Telephone Encounter (Signed)
Please follow up on patients referral for PT.

## 2016-08-31 ENCOUNTER — Ambulatory Visit: Payer: Commercial Managed Care - HMO | Attending: Internal Medicine | Admitting: Physical Therapy

## 2016-08-31 ENCOUNTER — Encounter: Payer: Self-pay | Admitting: Physical Therapy

## 2016-08-31 DIAGNOSIS — M6281 Muscle weakness (generalized): Secondary | ICD-10-CM

## 2016-08-31 DIAGNOSIS — R2689 Other abnormalities of gait and mobility: Secondary | ICD-10-CM | POA: Insufficient documentation

## 2016-08-31 NOTE — Therapy (Signed)
[facility] [address], [place], [place], [address] Phone: [phone]   Fax:  [phone]  Physical Therapy Evaluation  Patient Details  Name: [name] MRN: [id number] Date of Birth: 05/27/1946 Referring Provider: Dr. Scarlette Calico  Encounter Date: 08/31/2016      PT End of Session - 08/31/16 1011    Visit Number 1   Number of Visits 10   Date for PT Re-Evaluation 10/26/16   Authorization Type g-code at 10th visit   PT Start Time 0930   PT Stop Time 1010   PT Time Calculation (min) 40 min   Activity Tolerance Patient tolerated treatment well   Behavior During Therapy [facility] for tasks assessed/performed      Past Medical History:  Diagnosis Date  . Alcohol abuse, unspecified   . Anginal pain ([place])   . Arthritis    in back and shoulder  . H/O renal calculi   . History of kidney stones   . Hyperlipidemia    preTx 114, postTx 47 LDL  . Hypertension   . Shortness of breath dyspnea    with exertion  . Stroke [facility])    August '11 x 2: L. cerebellar, lateral medulla, ol rd lacunar infarcts;  2nd left pontine infarct.    Past Surgical History:  Procedure Laterality Date  . BACK SURGERY  1986  . CORONARY ARTERY BYPASS GRAFT N/A 09/06/2014   Procedure: CORONARY ARTERY BYPASS GRAFTING (CABG);  Surgeon: Ivin Poot, MD;  Location: [place];  Service: Open Heart Surgery;  Laterality: N/A;  CABG times 4, using internal mammary artery, and right leg saphenous vein harvested endoscopically  . LEFT HEART CATHETERIZATION WITH CORONARY ANGIOGRAM N/A 08/29/2014   Procedure: LEFT HEART CATHETERIZATION WITH CORONARY ANGIOGRAM;  Surgeon: Lorretta Harp, MD;  Location: [facility] CATH LAB;  Service: Cardiovascular;  Laterality: N/A;  . LUMBAR DISC SURGERY  1986   Dr. Juanetta Snow  . ROTATOR CUFF REPAIR  06/10/12   at [place]...left shoulder  . TEE WITHOUT CARDIOVERSION N/A 09/06/2014   Procedure: TRANSESOPHAGEAL  ECHOCARDIOGRAM (TEE);  Surgeon: Ivin Poot, MD;  Location: [place];  Service: Open Heart Surgery;  Laterality: N/A;  . TONSILLECTOMY  1954  . TONSILLECTOMY      There were no vitals filed for this visit.       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  The job was very physical.  Patient retired 4 years ago in January 2013. In 2015, patient noticed he favored his right side. Patient noticed foot drop on the right. Patient favors right side with walking and became progressively worse. Patient reports his balance is bad. Patient limp on right has become progressively worse.     Patient Stated Goals walking, balance, be able to go out again due to increased strength    Currently in Pain? No/denies            [facility] PT Assessment - 08/31/16 0001      Assessment   Medical Diagnosis R26.81 gait instability; M21.371 right foot drop   Referring Provider Dr. Scarlette Calico   Onset Date/Surgical Date 02/26/16   Prior Therapy yes for strokes and bak     Precautions   Precautions None     Restrictions   Weight Bearing Restrictions No     Balance Screen   Has the patient fallen in  the past 6 months No   Has the patient had a decrease in activity level because of a fear of falling?  Yes   Is the patient reluctant to leave their home because of a fear of falling?  No     Home Ecologist residence   Living Arrangements Spouse/significant other   Type of [place]  on a [place] to enter   Entrance Stairs-Number of Steps [place] Two level   Alternate Level Stairs-Number of Steps 24     Prior Function   Level of Independence Independent   Vocation Retired     Associate Professor   Overall Cognitive Status Within Functional Limits for tasks assessed     Observation/Other Assessments   Observations reduction of right quadrucep mass   Focus on  Therapeutic Outcomes (FOTO)  therapist discretion is 50% limitation due to gait deficits and sit to stand test     ROM / Strength   AROM / PROM / Strength AROM;Strength     AROM   Right Hip Extension 0   Right Ankle Dorsiflexion 8     Strength   Right Hip Flexion 4/5   Right Hip Extension 2+/5   Right Hip ABduction 2+/5   Right/Left Knee Right   Right Knee Flexion 4+/5   Right Knee Extension 3+/5   Right Ankle Dorsiflexion 3-/5   Right Ankle Inversion 3-/5   Right Ankle Eversion 3-/5     Flexibility   Soft Tissue Assessment /Muscle Length yes   Quadriceps right knee PROM in prone 85 degrees due to hip flexor tightness     Ambulation/Gait   Ambulation/Gait Yes   Assistive device None   Gait Pattern Decreased dorsiflexion - right;Decreased weight shift to right;Right circumduction;Right flexed knee in stance   Stairs Yes   Stairs Assistance 6: Modified independent (Device/Increase time)  needs a railing   Stair Management Technique One rail Right;One rail Left;Alternating pattern  drop foot on the right,      Standardized Balance Assessment   Five times sit to stand comments  18 sec, right leg began to quiver     Timed Up and Go Test   Normal TUG (seconds) 13     Functional Gait  Assessment   Gait assessed  Yes                           PT Education - 08/31/16 1011    Education provided No          PT Short Term Goals - 08/31/16 1019      PT SHORT TERM GOAL #1   Title independent with initial HEP   Time 4   Period Weeks   Status New     PT SHORT TERM GOAL #2   Title sit to stand in 15 seconds due to increased strength of right leg   Time 4   Period Weeks   Status New     PT SHORT TERM GOAL #3   Title able to walk up and down stairs with increased confidence >/= 25% when bringing his wife her coffee    Time 4   Period Weeks   Status New           PT Long Term Goals - 08/31/16 [id number]      PT LONG TERM GOAL #1   Title  independent with HEP  Time 8   Period Weeks   Status New     PT LONG TERM GOAL #2   Title be able to go out to entertain 50% more due to increase strength of right leg and decreased gait deficits   Time 8   Period Weeks   Status New     PT LONG TERM GOAL #3   Title sit to stand in 12 seconds due to improve strength of right leg >/= 4/5   Time 8   Period Weeks   Status New     PT LONG TERM GOAL #4   Title ability to ambulate with minimal to no right foot drop to decrease his risk of falls   Time 8   Period Weeks   Status New     PT LONG TERM GOAL #5   Title ability to go up and down stairs with >/= 75% increased in confidence due to increased right leg strength   Time 8   Period Weeks   Status New               Plan - 08/31/16 1012    Clinical Impression Statement Patient is a 70 year old male with diagnosis of gait instability and right foot drop.  Patient has a history of 2 strokes  on right and left side.  Patient reports his right foot drop and gait problems have become worse in the past 6 months.  Patient ambulates with decreased weightbear on the right, decreased right dorsiflexion, right hip circumduction, and decreased right knee flexion.  Patient needs to go up and down steps with rails and decreased right foot dorsiflexion.  Decreased strength of right hip, right ankle, and right knee extension averaging 2-4/5.  Right hip AROM for extension is 0 degrees.  Flex right knee in prone to 85 degrees due to tight hip flexor.  Patient may benefit from a AFO on the right if right foot drop does not improve. Sit to stand in 18 seconds.  Patient is of moderate complexity evaluation due to gait instability evolving and comorbidities that will impact plan of care is 2 strokes and previous back surgery.  Patient will benefit from skilled therapy to improve gait, balance and foot drop.    Rehab Potential Good   Clinical Impairments Affecting Rehab Potential history of strokes  right and left   PT Frequency 2x / week   PT Duration 8 weeks   PT Treatment/Interventions Electrical Stimulation;Stair training;Gait training;Therapeutic activities;Therapeutic exercise;Balance training;Neuromuscular re-education;Patient/family education;Passive range of motion;Manual techniques;Dry needling;Other (comment)  AFO for right foot drop   PT Next Visit Plan stretch right hip flexor; balance exercises; hip strength; right ankle strength; weight shifting exercises   PT Home Exercise Plan right hip flexor stretch; right hip strength   Recommended Other Services patient may need a AFO for drop foot if it is not corrected   Consulted and Agree with Plan of Care Patient      Patient will benefit from skilled therapeutic intervention in order to improve the following deficits and impairments:  Abnormal gait, Decreased strength, Decreased balance  Visit Diagnosis: Muscle weakness (generalized) - Plan: PT plan of care cert/re-cert  Other abnormalities of gait and mobility - Plan: PT plan of care cert/re-cert      G-Codes - [id number]    Functional Assessment Tool Used Sit to stand is 18 sec therefore with gait deficits is 50% limitation  goal is 25% limitation   Functional Limitation Mobility: Walking and  moving around   Mobility: Walking and Moving Around Current Status [phone]) At least 40 percent but less than 60 percent impaired, limited or restricted   Mobility: Walking and Moving Around Goal Status [phone]) At least 20 percent but less than 40 percent impaired, limited or restricted       Problem List Patient Active Problem List   Diagnosis Date Noted  . Gait instability 08/12/2016  . Right foot drop 08/12/2016  . Hepatitis C antibody test positive 02/27/2015  . Routine general medical examination at a health care facility 02/25/2015  . PAF- CHADVASC -5 09/18/2014  . 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 History of colonic polyps 02/19/2014  . BPH (benign prostatic  hyperplasia) 02/19/2014  . Hyperlipidemia with target LDL less than 100   . Stroke ([place])   . Hypertension   . Alcohol abuse, unspecified     Earlie Counts, PT 08/31/16 10:25 AM   [facility] [address], [place], [place], [address] Phone: [phone]   Fax:  [phone]  Name: [name] MRN: [id number] Date of Birth: [birth year]

## 2016-09-02 ENCOUNTER — Encounter: Payer: Commercial Managed Care - HMO | Admitting: Physical Therapy

## 2016-09-07 ENCOUNTER — Encounter: Payer: Commercial Managed Care - HMO | Admitting: Physical Therapy

## 2016-09-09 ENCOUNTER — Encounter: Payer: Commercial Managed Care - HMO | Admitting: Physical Therapy

## 2016-09-14 ENCOUNTER — Encounter: Payer: Commercial Managed Care - HMO | Admitting: Physical Therapy

## 2016-09-21 ENCOUNTER — Encounter: Payer: Commercial Managed Care - HMO | Admitting: Physical Therapy

## 2016-09-23 ENCOUNTER — Encounter: Payer: Commercial Managed Care - HMO | Admitting: Physical Therapy

## 2016-09-28 ENCOUNTER — Encounter: Payer: Commercial Managed Care - HMO | Admitting: Physical Therapy

## 2016-09-30 ENCOUNTER — Encounter: Payer: Commercial Managed Care - HMO | Admitting: Physical Therapy

## 2016-10-05 ENCOUNTER — Encounter: Payer: Commercial Managed Care - HMO | Admitting: Physical Therapy

## 2016-10-07 ENCOUNTER — Encounter: Payer: Commercial Managed Care - HMO | Admitting: Physical Therapy

## 2016-10-08 ENCOUNTER — Other Ambulatory Visit: Payer: Self-pay | Admitting: Internal Medicine

## 2016-11-01 ENCOUNTER — Ambulatory Visit: Payer: Commercial Managed Care - HMO | Attending: Internal Medicine | Admitting: Physical Therapy

## 2016-11-01 DIAGNOSIS — M6281 Muscle weakness (generalized): Secondary | ICD-10-CM | POA: Insufficient documentation

## 2016-11-01 DIAGNOSIS — R2689 Other abnormalities of gait and mobility: Secondary | ICD-10-CM | POA: Insufficient documentation

## 2016-11-01 NOTE — Patient Instructions (Signed)
Towel Slide    Sitting, use strong sideward motion with one foot to slide a towel. Smooth towel back out. Repeat in other direction. Repeat _10___ times. Do _1___ sessions per day.  http://gt2.exer.us/413   Copyright  VHI. All rights reserved.  ANKLE: Dorsiflexion (Band)    Sit at edge of surface. Place band around top of foot. Keeping heel on floor, raise toes of banded foot. Hold _1__ seconds. Use ___no_____ band. _10__ reps per set, _3__ sets per day, _7__ days per week  Copyright  VHI. All rights reserved.

## 2016-11-01 NOTE — Therapy (Addendum)
[facility] [address] [place], [place], [address] Phone: [phone]   Fax:  [phone]  Physical Therapy Treatment  Patient Details  Name: [name] MRN: [id number] Date of Birth: Aug 22, 1946 Referring Provider: Dr. Scarlette Calico  Encounter Date: 11/01/2016      PT End of Session - 11/01/16 1202    Visit Number 2   Number of Visits 10   Date for PT Re-Evaluation 12/27/16   Authorization Type g-code at 10th visit   PT Start Time 1102   PT Stop Time 1146   PT Time Calculation (min) 44 min   Activity Tolerance Patient tolerated treatment well   Behavior During Therapy [facility] for tasks assessed/performed      Past Medical History:  Diagnosis Date  . Alcohol abuse, unspecified   . Anginal pain ([place])   . Arthritis    in back and shoulder  . H/O renal calculi   . History of kidney stones   . Hyperlipidemia    preTx 114, postTx 47 LDL  . Hypertension   . Shortness of breath dyspnea    with exertion  . Stroke [facility])    August '11 x 2: L. cerebellar, lateral medulla, ol rd lacunar infarcts;  2nd left pontine infarct.    Past Surgical History:  Procedure Laterality Date  . BACK SURGERY  1986  . CORONARY ARTERY BYPASS GRAFT N/A 09/06/2014   Procedure: CORONARY ARTERY BYPASS GRAFTING (CABG);  Surgeon: Ivin Poot, MD;  Location: [place];  Service: Open Heart Surgery;  Laterality: N/A;  CABG times 4, using internal mammary artery, and right leg saphenous vein harvested endoscopically  . LEFT HEART CATHETERIZATION WITH CORONARY ANGIOGRAM N/A 08/29/2014   Procedure: LEFT HEART CATHETERIZATION WITH CORONARY ANGIOGRAM;  Surgeon: Lorretta Harp, MD;  Location: [facility] CATH LAB;  Service: Cardiovascular;  Laterality: N/A;  . LUMBAR DISC SURGERY  1986   Dr. Juanetta Snow  . ROTATOR CUFF REPAIR  06/10/12   at [place]...left shoulder  . TEE WITHOUT CARDIOVERSION N/A 09/06/2014   Procedure: TRANSESOPHAGEAL  ECHOCARDIOGRAM (TEE);  Surgeon: Ivin Poot, MD;  Location: [place];  Service: Open Heart Surgery;  Laterality: N/A;  . TONSILLECTOMY  1954  . TONSILLECTOMY      There were no vitals filed for this visit.      Subjective Assessment - 11/01/16 1111    Subjective Pt reports nothing has changed since eval.  Reports sitting for a while then standing after that has difficulty getting muscles to activate.  States balance is worse after sitting for a while.  States he works in the yard.     Patient Stated Goals walking, balance, be able to go out again due to increased strength, working in the yard   Currently in Pain? No/denies            [facility] PT Assessment - 11/01/16 0001      AROM   Right Hip Extension 0   Right Ankle Dorsiflexion 8     Strength   Right Hip Flexion 4/5   Right Hip Extension 2+/5   Right Hip ABduction 2+/5   Right Knee Flexion 4+/5   Right Knee Extension 4+/5   Right Ankle Dorsiflexion 3-/5   Right Ankle Inversion 3-/5   Right Ankle Eversion 3-/5                     [facility] Adult PT Treatment/Exercise - 11/01/16 0001  Knee/Hip Exercises: Machines for Strengthening   Cybex Leg Press 45# Rt LE 10x, 65# Rt LE 2x10     Knee/Hip Exercises: Seated   Sit to Sand 5 reps;Other (comment)  VC for foot placement and weight shifting after standing     Ankle Exercises: Seated   Towel Inversion/Eversion Weights (lbs) 30x   Toe Raise 20 reps                  PT Short Term Goals - 11/01/16 1201      PT SHORT TERM GOAL #1   Title independent with initial HEP   Time 4   Period Weeks   Status New     PT SHORT TERM GOAL #2   Title sit to stand in 15 seconds due to increased strength of right leg   Time 4   Period Weeks   Status New     PT SHORT TERM GOAL #3   Title able to walk up and down stairs with increased confidence >/= 25% when bringing his wife her coffee    Time 4   Period Weeks   Status New           PT Long Term  Goals - 11/01/16 1201      PT LONG TERM GOAL #1   Title independent with HEP   Time 8   Period Weeks   Status New     PT LONG TERM GOAL #2   Title be able to go out to entertain 50% more due to increase strength of right leg and decreased gait deficits   Time 8   Period Weeks   Status New     PT LONG TERM GOAL #3   Title sit to stand in 12 seconds due to improve strength of right leg >/= 4/5   Time 8   Period Weeks   Status New     PT LONG TERM GOAL #4   Title ability to ambulate with minimal to no right foot drop to decrease his risk of falls   Time 8   Period Weeks   Status New     PT LONG TERM GOAL #5   Title ability to go up and down stairs with >/= 75% increased in confidence due to increased right leg strength   Time 8   Period Weeks   Status New               Plan - 11/01/16 1152    Clinical Impression Statement Pt continues to have gait instability and Rt foot drop.  Pt was not able to attend PT since eval due to finances.  He has persistent weakness of Rt LE, decreased right hip, ankle, and knee extension 2-4/5.  Pt also demonstrates increased muscle spasms in Rt gastroc and hamstring.  Patient may benefit from AFO if unable to progress with PT.  Pt will continue to benefit from skilled PT at this time to address gait instability, weakness, and impairments as mentioned.   Rehab Potential Good   Clinical Impairments Affecting Rehab Potential history of strokes right and left   PT Frequency 2x / week   PT Duration 8 weeks   PT Treatment/Interventions Electrical Stimulation;Stair training;Gait training;Therapeutic activities;Therapeutic exercise;Balance training;Neuromuscular re-education;Patient/family education;Passive range of motion;Manual techniques;Dry needling;Other (comment)  AFO for foot drop   PT Next Visit Plan stretch right hip flexor; balance exercises; hip strength; right ankle strength; weight shifting exercises   PT Home Exercise Plan right hip  flexor stretch; right hip strength   Recommended Other Services may need AFO for right foot drop   Consulted and Agree with Plan of Care Patient      Patient will benefit from skilled therapeutic intervention in order to improve the following deficits and impairments:  Abnormal gait, Decreased strength, Decreased balance  Visit Diagnosis: Muscle weakness (generalized) - Plan: PT plan of care cert/re-cert  Other abnormalities of gait and mobility - Plan: PT plan of care cert/re-cert  G-codes : Mobility based on clinical reasoning  Current CK Goal CJ   Problem List Patient Active Problem List   Diagnosis Date Noted  . Gait instability 08/12/2016  . Right foot drop 08/12/2016  . Hepatitis C antibody test positive 02/27/2015  . Routine general medical examination at a health care facility 02/25/2015  . PAF- CHADVASC -5 09/18/2014  . 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 History of colonic polyps 02/19/2014  . BPH (benign prostatic hyperplasia) 02/19/2014  . Hyperlipidemia with target LDL less than 100   . Stroke ([place])   . Hypertension   . Alcohol abuse, unspecified     Zannie Cove, PT 11/01/2016, 12:08 PM  [facility] [address], [place], [place], [address] Phone: [phone]   Fax:  [phone]  Name: [name] MRN: [id number] Date of Birth: [birth year]  PHYSICAL THERAPY DISCHARGE SUMMARY  Visits from Start of Care: 2  Current functional level related to goals / functional outcomes: See above details, patient has not returned   Remaining deficits: Patient has not returned since last visit   Education / Equipment: HEP  Plan: Patient agrees to discharge.  Patient goals were not met. Patient is being discharged due to not returning since the last visit.  ?????        [employer], PT 11/23/16 10:12 AM

## 2016-11-05 ENCOUNTER — Encounter: Payer: Commercial Managed Care - HMO | Admitting: Physical Therapy

## 2016-11-09 ENCOUNTER — Ambulatory Visit: Payer: Commercial Managed Care - HMO | Admitting: Physical Therapy

## 2016-11-11 ENCOUNTER — Encounter: Payer: Commercial Managed Care - HMO | Admitting: Physical Therapy

## 2016-11-16 ENCOUNTER — Encounter: Payer: Commercial Managed Care - HMO | Admitting: Physical Therapy

## 2016-11-18 ENCOUNTER — Ambulatory Visit: Payer: Commercial Managed Care - HMO | Admitting: Physical Therapy

## 2016-11-23 ENCOUNTER — Encounter: Payer: Commercial Managed Care - HMO | Admitting: Physical Therapy

## 2016-11-25 ENCOUNTER — Encounter: Payer: Commercial Managed Care - HMO | Admitting: Physical Therapy

## 2016-11-30 ENCOUNTER — Encounter: Payer: Commercial Managed Care - HMO | Admitting: Physical Therapy

## 2016-12-02 ENCOUNTER — Encounter: Payer: Commercial Managed Care - HMO | Admitting: Physical Therapy

## 2017-04-26 ENCOUNTER — Other Ambulatory Visit: Payer: Self-pay | Admitting: Internal Medicine

## 2017-06-06 ENCOUNTER — Other Ambulatory Visit: Payer: Self-pay | Admitting: Cardiology

## 2017-06-20 ENCOUNTER — Other Ambulatory Visit: Payer: Self-pay | Admitting: Cardiology

## 2017-06-29 ENCOUNTER — Ambulatory Visit (INDEPENDENT_AMBULATORY_CARE_PROVIDER_SITE_OTHER): Payer: Medicare HMO | Admitting: Physician Assistant

## 2017-06-29 ENCOUNTER — Encounter: Payer: Self-pay | Admitting: Physician Assistant

## 2017-06-29 VITALS — BP 140/84 | HR 56 | Ht 73.0 in | Wt 181.6 lb

## 2017-06-29 DIAGNOSIS — I480 Paroxysmal atrial fibrillation: Secondary | ICD-10-CM

## 2017-06-29 DIAGNOSIS — Z79899 Other long term (current) drug therapy: Secondary | ICD-10-CM

## 2017-06-29 DIAGNOSIS — Z8673 Personal history of transient ischemic attack (TIA), and cerebral infarction without residual deficits: Secondary | ICD-10-CM

## 2017-06-29 DIAGNOSIS — I25810 Atherosclerosis of coronary artery bypass graft(s) without angina pectoris: Secondary | ICD-10-CM | POA: Diagnosis not present

## 2017-06-29 DIAGNOSIS — E785 Hyperlipidemia, unspecified: Secondary | ICD-10-CM

## 2017-06-29 DIAGNOSIS — I10 Essential (primary) hypertension: Secondary | ICD-10-CM | POA: Diagnosis not present

## 2017-06-29 NOTE — Progress Notes (Signed)
Cardiology Office Note    Date:  06/30/2017   ID:  [name], DOB 04/24/1946, MRN [id number]  PCP:  Eric Lima, MD  Cardiologist: Dr. Stanford [name]   Chief Complaint  Patient presents with  . Follow-up    seen for Dr. Stanford [name]    History of Present Illness:  [name] is a 71 y.o. male with PMH of HTN, HLD, remote tobacco abuse, posterior circulation CVA and CAD s/p 4v CABG. He has no prior documented history of atrial fibrillation at the time of stroke. Since then, he was noted to have severe 3 vessel CAD on cardiac cath 08/29/2014 and underwent 4 vessel CABG on 09/06/2014 with [name] to LAD, SVG to ramus, SVG to OM1 and SVG to RCA. Preoperative echocardiogram in December 2015 showed normal LV function and grade 1 DD. Carotid Doppler prior to bypass surgery in 2015 showed mild bilateral stenosis. Post bypass surgery, he did have postoperative atrial fibrillation and was treated with eliquis. He has some degree of dyspnea with extreme activity at baseline. He was last seen by Dr. Stanford [name] in October 2017, follow-up abdominal ultrasound obtained on 07/19/2016 was negative for aortic aneurysm.  [name] presents today for cardiology office visit. He continued to do very well. He frequently works in his yard without any exertional symptoms. His previous bypass surgery was preceded by exertional angina which he has not experienced recently. His last lipid panel obtained on 08/12/2016 showed very well-controlled cholesterol. He will need to obtain fasting lipid panel and LFTs during the next office visit with his PCP. Otherwise, only change recently was he stopped his eliquis on 06/11/2017 due to insurance doughnut hole, we will give him samples for eliquis. He does not have any lower extremity edema, orthopnea or PND. He can return to follow-up in 6 months.   Past Medical History:  Diagnosis Date  . Alcohol abuse, unspecified   . Anginal pain ([place])   . Arthritis    in back and  shoulder  . H/O renal calculi   . History of kidney stones   . Hyperlipidemia    preTx 114, postTx 47 LDL  . Hypertension   . Shortness of breath dyspnea    with exertion  . Stroke [facility])    August '11 x 2: L. cerebellar, lateral medulla, ol rd lacunar infarcts;  2nd left pontine infarct.    Past Surgical History:  Procedure Laterality Date  . BACK SURGERY  1986  . CORONARY ARTERY BYPASS GRAFT N/A 09/06/2014   Procedure: CORONARY ARTERY BYPASS GRAFTING (CABG);  Surgeon: Ivin Poot, MD;  Location: [place];  Service: Open Heart Surgery;  Laterality: N/A;  CABG times 4, using internal mammary artery, and right leg saphenous vein harvested endoscopically  . LEFT HEART CATHETERIZATION WITH CORONARY ANGIOGRAM N/A 08/29/2014   Procedure: LEFT HEART CATHETERIZATION WITH CORONARY ANGIOGRAM;  Surgeon: Lorretta Harp, MD;  Location: [facility] CATH LAB;  Service: Cardiovascular;  Laterality: N/A;  . LUMBAR DISC SURGERY  1986   Dr. Juanetta Snow  . ROTATOR CUFF REPAIR  06/10/12   at [place]...left shoulder  . TEE WITHOUT CARDIOVERSION N/A 09/06/2014   Procedure: TRANSESOPHAGEAL ECHOCARDIOGRAM (TEE);  Surgeon: Ivin Poot, MD;  Location: [place];  Service: Open Heart Surgery;  Laterality: N/A;  . TONSILLECTOMY  1954  . TONSILLECTOMY      Current Medications: Outpatient Medications Prior to Visit  Medication Sig Dispense Refill  . acetaminophen (TYLENOL) 325 MG tablet Take 2 tablets (  650 mg total) by mouth every 4 (four) hours as needed for headache or mild pain.    Marland Kitchen atorvastatin (LIPITOR) 20 MG tablet TAKE 1 TABLET (20 MG TOTAL) BY MOUTH DAILY. 90 tablet 1  . CIALIS 5 MG tablet TAKE 1 TABLET (5 MG TOTAL) BY MOUTH DAILY AS NEEDED FOR ERECTILE DYSFUNCTION. (NOT COVERED) 30 tablet 3  . metoprolol tartrate (LOPRESSOR) 25 MG tablet TAKE 1.5 TABLETS (37.5 MG TOTAL) BY MOUTH 2 (TWO) TIMES DAILY. 30 tablet 0  . NITROSTAT 0.4 MG SL tablet Place 1 tablet under the tongue as needed for chest  pain.   2  . apixaban (ELIQUIS) 5 MG TABS tablet Take 1 tablet (5 mg total) by mouth 2 (two) times daily. 28 tablet 0  . apixaban (ELIQUIS) 5 MG TABS tablet Take 1 tablet (5 mg total) by mouth 2 (two) times daily. Schedule f/u appointment with cardiologist piror to next refill authorization. 180 tablet 0   No facility-administered medications prior to visit.      Allergies:   Patient has no known allergies.   Social History   Social History  . Marital status: Married    Spouse name: N/A  . Number of children: 2  . Years of education: 12   Occupational History  . retail sales - paint    Social History Main Topics  . Smoking status: Former Smoker    Packs/day: 1.00    Years: 15.00    Types: Cigarettes    Quit date: 09/27/1978  . Smokeless tobacco: Never Used  . Alcohol use 3.6 oz/week    6 Cans of beer per week     Comment: stopped drinking after stroke. Had been drink 6 pk per day plus binge drinking  . Drug use: No  . Sexual activity: Yes    Partners: Female   Other Topics Concern  . None   Social History Narrative   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 Married '79. 1 dtr- '68, 1 step son. 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 Marriage in good health. He reports he is very strong in his faith.      Family History:  The patient's family history includes GI problems in his mother; Heart attack in his father; Heart disease in his father; Hypertension in his father.   ROS:   Please see the history of present illness.    ROS All other systems reviewed and are negative.   PHYSICAL EXAM:   VS:  BP 140/84   Pulse (!) 56   Ht 6\' 1"  (1.854 m)   Wt 181 lb 9.6 oz (82.4 kg)   BMI 23.96 kg/m    GEN: Well nourished, well developed, in no acute distress  HEENT: normal  Neck: no JVD, carotid bruits, or masses Cardiac: RRR; no murmurs, rubs, or gallops,no edema  Respiratory:  clear to auscultation bilaterally,  normal work of breathing GI: soft, nontender, nondistended, + BS MS: no deformity or atrophy  Skin: warm and dry, no rash Neuro:  Alert and Oriented x 3, Strength and sensation are intact Psych: euthymic mood, full affect  Wt Readings from Last 3 Encounters:  06/29/17 181 lb 9.6 oz (82.4 kg)  08/12/16 185 lb (83.9 kg)  06/28/16 180 lb (81.6 kg)      Studies/Labs Reviewed:   EKG:  EKG is ordered today.  The ekg ordered today demonstrates Sinus bradycardia, heart rate  56, otherwise no significant ST-T wave changes  Recent Labs: 08/12/2016: ALT 30; BUN 11; Creatinine, Ser 0.69; Hemoglobin 15.3; Platelets 198.0; Potassium 4.4; Sodium 140; TSH 1.38   Lipid Panel    Component Value Date/Time   CHOL 141 08/12/2016 1045   TRIG 64.0 08/12/2016 1045   HDL 61.30 08/12/2016 1045   CHOLHDL 2 08/12/2016 1045   VLDL 12.8 08/12/2016 1045   LDLCALC 67 08/12/2016 1045    Additional studies/ records that were reviewed today include:   Cath 08/29/2014 HEMODYNAMICS:    AO SYSTOLIC/AO DIASTOLIC: 194/17   LV SYSTOLIC/LV DIASTOLIC: 408/1  ANGIOGRAPHIC RESULTS:   1. Left main; 20-30% tapering distal 2. LAD; fluoroscopically calcified with a 75-80% fairly focal stenosis in the proximal portion 3. Left circumflex; nondominant and occluded at its origin with faint left to left collaterals. There was a moderate to large ramus branch that had a 75-80% hypodense calcified proximal stenosis.  4. Right coronary artery; dominant with a 95% calcified mid stenosis. There were right to left collaterals to the occluded circumflex coronary artery 5. Left ventriculography; RAO left ventriculogram was performed using  25 mL of Visipaque dye at 12 mL/second. The overall LVEF estimated  60 %  Without wall motion abnormalities    Echo 09/06/2014 LV EF: 50% -  55%  Study Conclusions  - Left ventricle: The cavity size was normal. Wall thickness was normal. Systolic function was normal. The estimated  ejection fraction was in the range of 50% to 55%. Wall motion was normal; there were no regional wall motion abnormalities. - Staged echo: Limited post-CPB exam: Good, vigorous LVEF. No change post bypass in aortic valve function. No change post bypass in mitral valve function.  Impressions:  - LV function improvement from pre-bypass images. No other change from pre-bypass images.    CABG by Dr. Prescott Gum OPERATION: 1. Coronary artery bypass grafting x4 (left internal mammary artery to     LAD, saphenous vein graft to ramus intermediate, saphenous vein     graft to obtuse marginal 1, saphenous vein graft to right coronary     artery). 2. Endoscopic harvest of the left leg greater saphenous vein, exposure     but not harvest of saphenous vein from the right leg.    Aortic US 07/19/2016 Normal caliber abdominal aorta, common and external iliac arteries, without focal dilatation. Aorto-iliac atherosclerosis, without focal stenosis.   ASSESSMENT:    1. PAF- CHADVASC -5   2. Essential hypertension   3. Dyslipidemia   4. Medication management   5. Coronary artery disease involving coronary bypass graft of native heart without angina pectoris   6. H/O: CVA (cerebrovascular accident)      PLAN:  In order of problems listed above:  1. Postoperative PAF: No obvious recurrence, he is on systemic anticoagulation especially given history of cryptogenic stroke. He self discontinued her eliquis on 06/11/2017 due to insurance doughnut hole. We have given him samples of eliquis. Otherwise he will continue on metoprolol tartrate 37.5 mg twice a day.  - CHA2DS2-Vasc score 5 (age, HTN, CAD, CVA)  2. CAD s/p CABG x 4: He is active and denies any recent exertional angina  3. Hypertension: Blood pressure stable on current medication  4. Hyperlipidemia: Last lipid panel showed well-controlled cholesterol in 2017, he is due for repeat fasting lipid panel and LFTs during the next  office visit at his PCPs office. Continue Lipitor 20 mg daily.   5. History of cryptogenic CVA: Atrial fibrillation diagnosed later  after surgery, however does not appears to have known atrial fibrillation around the time of CVA.    Medication Adjustments/Labs and Tests Ordered: Current medicines are reviewed at length with the patient today.  Concerns regarding medicines are outlined above.  Medication changes, Labs and Tests ordered today are listed in the Patient Instructions below. Patient Instructions  Almyra Deforest, PA recommends that you continue on your current medications as directed. Please refer to the Current Medication list given to you today.  Your physician recommends that you return for lab work at your next visit with your primary care provider.  Isaac Laud recommends that you schedule a follow-up appointment in 6 months with Dr [name]. You will receive a reminder letter in the mail two months in advance. If you don't receive a letter, please call our office to schedule the follow-up appointment.  If you need a refill on your cardiac medications before your next appointment, please call your pharmacy.    Hilbert Corrigan, [place]  06/30/2017 10:48 PM    [facility] [place], [place], [place]  [address] Phone: [phone]; Fax: [phone]

## 2017-06-29 NOTE — Patient Instructions (Signed)
[name], [name] recommends that you continue on your current medications as directed. Please refer to the Current Medication list given to you today.  Your physician recommends that you return for lab work at your next visit with your primary care provider.  [name] recommends that you schedule a follow-up appointment in 6 months with [name]. You will receive a reminder letter in the mail two months in advance. If you don't receive a letter, please call our office to schedule the follow-up appointment.  If you need a refill on your cardiac medications before your next appointment, please call your pharmacy.

## 2017-06-30 ENCOUNTER — Encounter: Payer: Self-pay | Admitting: Physician Assistant

## 2017-07-08 ENCOUNTER — Other Ambulatory Visit: Payer: Self-pay | Admitting: *Deleted

## 2017-07-08 MED ORDER — METOPROLOL TARTRATE 25 MG PO TABS: 37.5000 mg | ORAL_TABLET | Freq: Two times a day (BID) | ORAL | 9 refills | Status: DC

## 2017-07-08 NOTE — Telephone Encounter (Signed)
REFILL 

## 2017-07-08 NOTE — Telephone Encounter (Signed)
Pharmacy requests ninety days. 

## 2017-08-26 ENCOUNTER — Telehealth: Payer: Self-pay | Admitting: Internal Medicine

## 2017-08-26 NOTE — Telephone Encounter (Signed)
Copied from [place] [phone]. Topic: Quick Communication - See Telephone Encounter >> Aug 26, 2017 10:01 AM Antonieta Iba C wrote: CRM for notification. See Telephone encounter for:  08/26/17.   Pt called in because he said that he has reached his donut hole and is having to pay 241.00 vs. 46.00 for his medication Eliquis. (not showing medication in chart) Pt would like to know if office has samples or if provider could give any suggestions. Pt says that he will be back in the clear starting January so his medication will go back to 46.00.    Please advise: [phone]

## 2017-08-26 NOTE — Telephone Encounter (Signed)
We do not have samples, but if he is interested, we can start the patient assistance process or he can call Social Security Administration. They may be able to help with the cost of all medications.

## 2017-08-26 NOTE — Telephone Encounter (Signed)
Called pt to let him know. He said that he did not see a need for that. He said that he would take his chances and not take this medication through December but would start back in January.

## 2017-10-10 ENCOUNTER — Other Ambulatory Visit: Payer: Self-pay | Admitting: Internal Medicine

## 2017-10-18 ENCOUNTER — Other Ambulatory Visit (INDEPENDENT_AMBULATORY_CARE_PROVIDER_SITE_OTHER): Payer: Medicare HMO

## 2017-10-18 ENCOUNTER — Encounter: Payer: Self-pay | Admitting: Internal Medicine

## 2017-10-18 ENCOUNTER — Ambulatory Visit (INDEPENDENT_AMBULATORY_CARE_PROVIDER_SITE_OTHER): Payer: Medicare HMO | Admitting: Internal Medicine

## 2017-10-18 VITALS — BP 140/88 | HR 74 | Temp 97.8°F | Ht 73.0 in | Wt 184.0 lb

## 2017-10-18 DIAGNOSIS — Z86010 Personal history of colonic polyps: Secondary | ICD-10-CM

## 2017-10-18 DIAGNOSIS — E785 Hyperlipidemia, unspecified: Secondary | ICD-10-CM

## 2017-10-18 DIAGNOSIS — I10 Essential (primary) hypertension: Secondary | ICD-10-CM | POA: Diagnosis not present

## 2017-10-18 DIAGNOSIS — I480 Paroxysmal atrial fibrillation: Secondary | ICD-10-CM | POA: Diagnosis not present

## 2017-10-18 DIAGNOSIS — N400 Enlarged prostate without lower urinary tract symptoms: Secondary | ICD-10-CM

## 2017-10-18 LAB — COMPREHENSIVE METABOLIC PANEL
ALBUMIN: 4.3 g/dL (ref 3.5–5.2)
ALT: 35 U/L (ref 0–53)
AST: 45 U/L — ABNORMAL HIGH (ref 0–37)
Alkaline Phosphatase: 67 U/L (ref 39–117)
BUN: 13 mg/dL (ref 6–23)
CALCIUM: 9.3 mg/dL (ref 8.4–10.5)
CHLORIDE: 105 meq/L (ref 96–112)
CO2: 30 mEq/L (ref 19–32)
Creatinine, Ser: 0.85 mg/dL (ref 0.40–1.50)
GFR: 94.6 mL/min (ref >60.00)
Glucose, Bld: 105 mg/dL — ABNORMAL HIGH (ref 70–99)
Potassium: 5.1 mEq/L (ref 3.5–5.1)
SODIUM: 140 meq/L (ref 135–145)
Total Bilirubin: 0.4 mg/dL (ref 0.2–1.2)
Total Protein: 6.8 g/dL (ref 6.0–8.3)

## 2017-10-18 LAB — CBC WITH DIFFERENTIAL/PLATELET
BASOS PCT: 0.4 % (ref 0.0–3.0)
Basophils Absolute: 0 10*3/uL (ref 0.0–0.1)
EOS PCT: 2.5 % (ref 0.0–5.0)
Eosinophils Absolute: 0.1 10*3/uL (ref 0.0–0.7)
HCT: 45 % (ref 39.0–52.0)
HEMOGLOBIN: 14.8 g/dL (ref 13.0–17.0)
Lymphocytes Relative: 39.6 % (ref 12.0–46.0)
Lymphs Abs: 2.2 10*3/uL (ref 0.7–4.0)
MCHC: 32.9 g/dL (ref 30.0–36.0)
MCV: 94.4 fl (ref 78.0–100.0)
MONO ABS: 0.5 10*3/uL (ref 0.1–1.0)
Monocytes Relative: 9.7 % (ref 3.0–12.0)
Neutro Abs: 2.6 10*3/uL (ref 1.4–7.7)
Neutrophils Relative %: 47.8 % (ref 43.0–77.0)
Platelets: 189 10*3/uL (ref 150.0–400.0)
RBC: 4.77 Mil/uL (ref 4.22–5.81)
RDW: 12.8 % (ref 11.5–15.5)
WBC: 5.5 10*3/uL (ref 4.0–10.5)

## 2017-10-18 LAB — LIPID PANEL
CHOLESTEROL: 129 mg/dL (ref 0–200)
HDL: 58.2 mg/dL (ref >39.00)
LDL CALC: 56 mg/dL (ref 0–99)
NonHDL: 71.09
TRIGLYCERIDES: 74 mg/dL (ref 0.0–149.0)
Total CHOL/HDL Ratio: 2
VLDL: 14.8 mg/dL (ref 0.0–40.0)

## 2017-10-18 LAB — THYROID PANEL WITH TSH
FREE THYROXINE INDEX: 2.4 (ref 1.4–3.8)
T3 Uptake: 32 % (ref 22–35)
T4, Total: 7.6 ug/dL (ref 4.9–10.5)
TSH: 4.2 m[IU]/L (ref 0.40–4.50)

## 2017-10-18 LAB — PSA: PSA: 2.08 ng/mL (ref 0.10–4.00)

## 2017-10-18 NOTE — Patient Instructions (Signed)

## 2017-10-18 NOTE — Progress Notes (Signed)
Subjective:  Patient ID: [name], male    DOB: 1946-01-30  Age: 72 y.o. MRN: [id number]  CC: Hypertension and Hyperlipidemia   HPI [name] presents for f/up - He feels well today and offers no complaints.  He has had no recent episodes of palpitations.  He is very active and denies CP, DOE, SOB, edema, or fatigue.  He is tolerating the statin medication well with no muscle or joint aches.  Outpatient Medications Prior to Visit  Medication Sig Dispense Refill  . acetaminophen (TYLENOL) 325 MG tablet Take 2 tablets (650 mg total) by mouth every 4 (four) hours as needed for headache or mild pain.    Marland Kitchen CIALIS 5 MG tablet TAKE 1 TABLET (5 MG TOTAL) BY MOUTH DAILY AS NEEDED FOR ERECTILE DYSFUNCTION. (NOT COVERED) 30 tablet 3  . ELIQUIS 5 MG TABS tablet Take 5 mg by mouth 2 (two) times daily.  0  . NITROSTAT 0.4 MG SL tablet Place 1 tablet under the tongue as needed for chest pain.   2  . atorvastatin (LIPITOR) 20 MG tablet TAKE 1 TABLET (20 MG TOTAL) BY MOUTH DAILY. 90 tablet 1  . metoprolol tartrate (LOPRESSOR) 25 MG tablet Take 1.5 tablets (37.5 mg total) by mouth 2 (two) times daily. 90 tablet 9   No facility-administered medications prior to visit.     ROS Review of Systems  Constitutional: Negative.  Negative for activity change, appetite change, diaphoresis, fatigue and unexpected weight change.  HENT: Negative.   Eyes: Negative.  Negative for visual disturbance.  Respiratory: Negative for cough, chest tightness, shortness of breath and wheezing.   Cardiovascular: Negative for chest pain, palpitations and leg swelling.  Gastrointestinal: Negative for abdominal pain, constipation and diarrhea.  Genitourinary: Negative.  Negative for difficulty urinating.  Musculoskeletal: Negative.  Negative for arthralgias, myalgias and neck pain.  Skin: Negative.   Allergic/Immunologic: Negative.   Neurological: Negative.  Negative for dizziness, syncope and light-headedness.    Hematological: Negative for adenopathy. Does not bruise/bleed easily.  Psychiatric/Behavioral: Negative.     Objective:  BP 140/88 (BP Location: Left Arm, Patient Position: Sitting, Cuff Size: Normal)   Pulse 74   Temp 97.8 F (36.6 C) (Oral)   Ht 6\' 1"  (1.854 m)   Wt 184 lb (83.5 kg)   SpO2 98%   BMI 24.28 kg/m   BP Readings from Last 3 Encounters:  10/18/17 140/88  06/29/17 140/84  08/12/16 124/72    Wt Readings from Last 3 Encounters:  10/18/17 184 lb (83.5 kg)  06/29/17 181 lb 9.6 oz (82.4 kg)  08/12/16 185 lb (83.9 kg)    Physical Exam  Constitutional: He is oriented to person, place, and time. No distress.  HENT:  Mouth/Throat: Oropharynx is clear and moist. No oropharyngeal exudate.  Eyes: Conjunctivae are normal. Left eye exhibits no discharge. No scleral icterus.  Neck: Normal range of motion. Neck supple. No JVD present. No thyromegaly present.  Cardiovascular: Normal rate, regular rhythm and normal heart sounds. Exam reveals no gallop.  No murmur heard. Pulmonary/Chest: Effort normal and breath sounds normal. No respiratory distress. He has no wheezes. He has no rales.  Abdominal: Soft. Bowel sounds are normal. He exhibits no mass. There is no tenderness. There is no guarding.  Musculoskeletal: Normal range of motion. He exhibits no edema, tenderness or deformity.  Lymphadenopathy:    He has no cervical adenopathy.  Neurological: He is alert and oriented to person, place, and time.  Skin: Skin is  warm and dry. No rash noted. He is not diaphoretic. No erythema. No pallor.  Vitals reviewed.   Lab Results  Component Value Date   WBC 5.5 10/18/2017   HGB 14.8 10/18/2017   HCT 45.0 10/18/2017   PLT 189.0 10/18/2017   GLUCOSE 105 (H) 10/18/2017   CHOL 129 10/18/2017   TRIG 74.0 10/18/2017   HDL 58.20 10/18/2017   LDLCALC 56 10/18/2017   ALT 35 10/18/2017   AST 45 (H) 10/18/2017   NA 140 10/18/2017   K 5.1 10/18/2017   CL 105 10/18/2017   CREATININE  0.85 10/18/2017   BUN 13 10/18/2017   CO2 30 10/18/2017   TSH 4.20 10/18/2017   PSA 2.08 10/18/2017   INR 1.5 12/02/2014   HGBA1C 5.7 (H) 09/05/2014    No results found.  Assessment & Plan:   Cla[place]orne was seen today for hypertension and hyperlipidemia.  Diagnoses and all orders for this visit:  Benign prostatic hyperplasia without lower urinary tract symptoms- His PSA remains low which is reassuring that he does not have prostate cancer.  He has no symptoms that need to be treated. -     PSA; Future  Essential hypertension- His blood pressure is well controlled. -     Comprehensive metabolic panel; Future -     CBC with Differential/Platelet; Future -     metoprolol tartrate (LOPRESSOR) 25 MG tablet; Take 1.5 tablets (37.5 mg total) by mouth 2 (two) times daily.  PAF- CHADVASC -5- He has good rate and rhythm control.  Will continue metoprolol at the current dose.  Will continue anticoagulation with Eliquis. -     Thyroid Panel With TSH; Future -     metoprolol tartrate (LOPRESSOR) 25 MG tablet; Take 1.5 tablets (37.5 mg total) by mouth 2 (two) times daily.  Hyperlipidemia with target LDL less than 100- He has achieved his LDL goal and is doing well on the statin. -     Lipid panel; Future -     Comprehensive metabolic panel; Future -     Thyroid Panel With TSH; Future -     atorvastatin (LIPITOR) 20 MG tablet; Take 1 tablet (20 mg total) by mouth daily.  History of colonic polyps -     Ambulatory referral to Gastroenterology   I am having [name] maintain his acetaminophen, NITROSTAT, CIALIS, ELIQUIS, metoprolol tartrate, and atorvastatin.  Meds ordered this encounter  Medications  . metoprolol tartrate (LOPRESSOR) 25 MG tablet    Sig: Take 1.5 tablets (37.5 mg total) by mouth 2 (two) times daily.    Dispense:  270 tablet    Refill:  1  . atorvastatin (LIPITOR) 20 MG tablet    Sig: Take 1 tablet (20 mg total) by mouth daily.    Dispense:  90 tablet    Refill:   1     Follow-up: Return in about 6 months (around 04/17/2018).  Scarlette Calico, MD

## 2017-10-19 ENCOUNTER — Encounter: Payer: Self-pay | Admitting: Internal Medicine

## 2017-10-19 MED ORDER — ATORVASTATIN CALCIUM 20 MG PO TABS: 20.0000 mg | ORAL_TABLET | Freq: Every day | ORAL | 1 refills | Status: DC

## 2017-10-19 MED ORDER — METOPROLOL TARTRATE 25 MG PO TABS: 37.5000 mg | ORAL_TABLET | Freq: Two times a day (BID) | ORAL | 1 refills | Status: DC

## 2017-10-20 ENCOUNTER — Encounter: Payer: Self-pay | Admitting: Gastroenterology

## 2017-10-20 ENCOUNTER — Telehealth: Payer: Self-pay | Admitting: Internal Medicine

## 2017-10-20 NOTE — Telephone Encounter (Signed)
The referral to GI was made on his appt on 10/18/2017.

## 2017-10-20 NOTE — Telephone Encounter (Signed)
LM letting patient know and gave GI contact info.

## 2017-10-20 NOTE — Telephone Encounter (Signed)
Copied from [place]. Topic: Referral - Request >> Oct 20, 2017  8:26 AM Robina Ade, Helene Kelp D wrote: Reason for CRM: Patient would like to talk to Dr. Ronnald Ramp or his CMA about making an appt for him to have a colonoscopy done. Please call patient back, thanks.

## 2017-10-26 ENCOUNTER — Telehealth: Payer: Self-pay | Admitting: Internal Medicine

## 2017-10-26 NOTE — Telephone Encounter (Signed)
Patient is very anxious to know his results, he says.

## 2017-10-26 NOTE — Telephone Encounter (Signed)
Copied from [place]. Topic: Quick Communication - See Telephone Encounter >> Oct 26, 2017 11:37 AM Cleaster Corin, NT wrote: CRM for notification. See Telephone encounter for:   10/26/17.pt. Calling to receive lab results [phone] from 10-18-17

## 2017-10-27 NOTE — Telephone Encounter (Signed)
Pt lab results are normal a letter was sent out with his detailed results

## 2017-11-01 NOTE — Telephone Encounter (Signed)
Patient called in to the [facility] very upset over no one speaking with him about his results. I spoke with patient and I have informed him of his lab results. He understood. I also informed him he will get a copy of it all in the mail to go over.

## 2017-12-02 ENCOUNTER — Telehealth: Payer: Self-pay

## 2017-12-02 ENCOUNTER — Ambulatory Visit: Payer: Medicare HMO | Admitting: Gastroenterology

## 2017-12-02 ENCOUNTER — Encounter: Payer: Self-pay | Admitting: Gastroenterology

## 2017-12-02 ENCOUNTER — Encounter (INDEPENDENT_AMBULATORY_CARE_PROVIDER_SITE_OTHER): Payer: Self-pay

## 2017-12-02 VITALS — BP 108/70 | HR 58 | Ht 73.0 in | Wt 187.6 lb

## 2017-12-02 DIAGNOSIS — Z86010 Personal history of colonic polyps: Secondary | ICD-10-CM | POA: Diagnosis not present

## 2017-12-02 DIAGNOSIS — Z7901 Long term (current) use of anticoagulants: Secondary | ICD-10-CM

## 2017-12-02 MED ORDER — NA SULFATE-K SULFATE-MG SULF 17.5-3.13-1.6 GM/177ML PO SOLN: 1.0000 | Freq: Once | ORAL | 0 refills | Status: AC

## 2017-12-02 NOTE — Progress Notes (Signed)
HPI :  72 y/o male with a history of stroke x 2, atrial fibrillation on Eliquis, CAD s/p CABG, HTN, history of polyps, referred by Dr. Scarlette Calico to discuss colon cancer screening.  Patient had his last colonoscopy in 2006. Report not available but pathology shows a "ascending hyperplastic polyp".  He denies any changes in his bowel habits. No blood in his stools. No abdominal pains. No weight loss.    He states his last stroke was about 6 or 7 years ago which she is recovered from.  He denies any chest pain or shortness of breath.  He had a CABG in 2015 and follows with Dr. Stanford Breed of cardiology.  His last cardiogram showed an EF of 50-55%.  He states he is very active doing yard work.  TEE 08/2014 - EF 50-55%  Colonoscopy 06/22/2005 - ascending "hyperplastic polyp"   Past Medical History:  Diagnosis Date  . Alcohol abuse, unspecified   . Anginal pain ([place])   . Arthritis    in back and shoulder  . H/O renal calculi   . History of kidney stones   . Hyperlipidemia    preTx 114, postTx 47 LDL  . Hypertension   . Shortness of breath dyspnea    with exertion  . Stroke [facility])    August '11 x 2: L. cerebellar, lateral medulla, ol rd lacunar infarcts;  2nd left pontine infarct.     Past Surgical History:  Procedure Laterality Date  . BACK SURGERY  1986  . CORONARY ARTERY BYPASS GRAFT N/A 09/06/2014   Procedure: CORONARY ARTERY BYPASS GRAFTING (CABG);  Surgeon: Ivin Poot, MD;  Location: [facility];  Service: Open Heart Surgery;  Laterality: N/A;  CABG times 4, using internal mammary artery, and right leg saphenous vein harvested endoscopically  . LEFT HEART CATHETERIZATION WITH CORONARY ANGIOGRAM N/A 08/29/2014   Procedure: LEFT HEART CATHETERIZATION WITH CORONARY ANGIOGRAM;  Surgeon: Lorretta Harp, MD;  Location: [facility] CATH LAB;  Service: Cardiovascular;  Laterality: N/A;  . LUMBAR DISC SURGERY  1986   Dr. Juanetta Snow  . ROTATOR CUFF REPAIR  06/10/12   at [facility]...left shoulder  . TEE WITHOUT CARDIOVERSION N/A 09/06/2014   Procedure: TRANSESOPHAGEAL ECHOCARDIOGRAM (TEE);  Surgeon: Ivin Poot, MD;  Location: [facility];  Service: Open Heart Surgery;  Laterality: N/A;  . TONSILLECTOMY  1954  . TONSILLECTOMY     Family History  Problem Relation Age of Onset  . Heart disease Father   . Hypertension Father   . Heart attack Father   . GI problems Mother   . Cancer Neg Hx   . Diabetes Neg Hx   . Early death Neg Hx   . Hearing loss Neg Hx   . Hyperlipidemia Neg Hx   . Kidney disease Neg Hx   . Stroke Neg Hx   . Alcohol abuse Neg Hx    Social History   Tobacco Use  . Smoking status: Former Smoker    Packs/day: 1.00    Years: 15.00    Pack years: 15.00    Types: Cigarettes    Last attempt to quit: 09/27/1978    Years since quitting: 39.2  . Smokeless tobacco: Never Used  Substance Use Topics  . Alcohol use: Yes    Alcohol/week: 3.6 oz    Types: 6 Cans of beer per week    Comment: stopped drinking after stroke. Had been drink 6 pk per day plus binge drinking  . Drug use: No  Current Outpatient Medications  Medication Sig Dispense Refill  . acetaminophen (TYLENOL) 325 MG tablet Take 2 tablets (650 mg total) by mouth every 4 (four) hours as needed for headache or mild pain.    Marland Kitchen atorvastatin (LIPITOR) 20 MG tablet Take 1 tablet (20 mg total) by mouth daily. 90 tablet 1  . ELIQUIS 5 MG TABS tablet Take 5 mg by mouth 2 (two) times daily.  0  . metoprolol tartrate (LOPRESSOR) 25 MG tablet Take 1.5 tablets (37.5 mg total) by mouth 2 (two) times daily. 270 tablet 1  . NITROSTAT 0.4 MG SL tablet Place 1 tablet under the tongue as needed for chest pain.   2   No current facility-administered medications for this visit.    No Known Allergies   Review of Systems: All systems reviewed and negative except where noted in HPI.   Lab Results  Component Value Date   WBC 5.5 10/18/2017   HGB 14.8 10/18/2017   HCT 45.0 10/18/2017    MCV 94.4 10/18/2017   PLT 189.0 10/18/2017    Lab Results  Component Value Date   CREATININE 0.85 10/18/2017   BUN 13 10/18/2017   NA 140 10/18/2017   K 5.1 10/18/2017   CL 105 10/18/2017   CO2 30 10/18/2017    Physical Exam: BP 108/70   Pulse (!) 58   Ht 6\' 1"  (1.854 m)   Wt 187 lb 9.6 oz (85.1 kg)   BMI 24.75 kg/m  Constitutional: Pleasant,well-developed, male in no acute distress. HEENT: Normocephalic and atraumatic. Conjunctivae are normal. No scleral icterus. Neck supple.  Cardiovascular: Normal rate, regular rhythm.  Pulmonary/chest: Effort normal and breath sounds normal. No wheezing, rales or rhonchi. Abdominal: Soft, nondistended, nontender.  There are no masses palpable. No hepatomegaly. Extremities: no edema Lymphadenopathy: No cervical adenopathy noted. Neurological: Alert and oriented to person place and time. Skin: Skin is warm and dry. No rashes noted. Psychiatric: Normal mood and affect. Behavior is normal.   ASSESSMENT AND PLAN: 72 year old male with history of CVA x2, CABG, atrial fibrillation on Eliquis, history of colon polyps, referred here to discuss colon cancer screening options and if he wants to have any screening.  He had a history of a right-sided "hyperplastic polyp" in 2006, suspect this could be a sessile serrated polyp.  In this light I am recommending an optical colonoscopy.  I discussed the risks and benefits of colonoscopy with him.  We also discussed stool based testing.  Following discussion of these options he wanted to proceed with an optical colonoscopy.  We will recheck to Dr. Jacalyn Lefevre office to see if it is okay to hold his Eliquis for 2 days prior to the procedure.  Further recommendations pending results,  he agreed with the plan.  Dutchess Cellar, MD [facility] Pager [phone]  CC: Janith Lima, MD

## 2017-12-02 NOTE — Telephone Encounter (Signed)
   [name] 01-Apr-1946 [id number]  Dear Dr. Stanford Breed:  We have scheduled the above named patient for a(n) colonoscopy procedure. Our records show that (s)he is on anticoagulation therapy.  Please advise as to whether the patient may come off their therapy of Eliquis 2 days prior to their procedure which is scheduled for  Friday 12-09-17.  Please route your response to Lemar Lofty or fax response to [phone].  Sincerely,    [facility]

## 2017-12-02 NOTE — Patient Instructions (Addendum)
If you are age 71 or older, your body mass index should be between 23-30. Your Body mass index is 24.75 kg/m. If this is out of the aforementioned range listed, please consider follow up with your Primary Care Provider.  If you are age 79 or younger, your body mass index should be between 19-25. Your Body mass index is 24.75 kg/m. If this is out of the aformentioned range listed, please consider follow up with your Primary Care Provider.   You have been scheduled for a colonoscopy. Please follow written instructions given to you at your visit today.  Please pick up your prep supplies at the pharmacy within the next 1-3 days. If you use inhalers (even only as needed), please bring them with you on the day of your procedure. Your physician has requested that you go to www.startemmi.com and enter the access code given to you at your visit today. This web site gives a general overview about your procedure. However, you should still follow specific instructions given to you by our office regarding your preparation for the procedure.  You will be contacted by our office prior to your procedure for directions on holding your Eliquis.  If you do not hear from our office 1 week prior to your scheduled procedure, please call [phone] to discuss.   Thank you for entrusting me with your care and for choosing [facility], Dr. Darwin Cellar

## 2017-12-02 NOTE — Telephone Encounter (Signed)
Reviewed with Dr Stanford Breed, OK to hold Eliquis 2 days pre op, resume next day if possible.  Kerin Ransom PA-C 12/02/2017 4:21 PM

## 2017-12-02 NOTE — Telephone Encounter (Signed)
Hold apixaban 2 days prior to procedure and resume day after Kirk Ruths

## 2017-12-05 ENCOUNTER — Telehealth: Payer: Self-pay | Admitting: Cardiology

## 2017-12-05 NOTE — Telephone Encounter (Signed)
No answer when called back and voicemail disconnected the call.

## 2017-12-05 NOTE — Telephone Encounter (Signed)
New Message   Pt c/o medication issue:  1. Name of Medication: Eliquis  2. How are you currently taking this medication (dosage and times per day)? ELIQUIS 5 MG TABS tablet  3. Are you having a reaction (difficulty breathing--STAT)? no  4. What is your medication issue? Pt verbalized that the Eliquis is becoming too expensive and would like to know if he could switch back to taking Warfarin but with out the blood monitoring. Please call

## 2017-12-05 NOTE — Telephone Encounter (Signed)
Patient has been advised that per Dr Jacalyn Lefevre office, he may hold Eliquis 2 days prior to procedure. Patient verbalizes understanding of this.

## 2017-12-06 NOTE — Telephone Encounter (Signed)
New message    Patient is currently taking Eliquis and has taken coumadin in the past, he is considering changing back to coumadin because if he has it done at his PCP it is a 0 copay.  How often does he have to have his blood checked if he goes back to the coumadin?

## 2017-12-06 NOTE — Telephone Encounter (Signed)
Returned call to patient who is concerned about the cost of Eliquis - has been on this since 12/30/2014. He has taken warfarin the past. He states he has friends who take warfarin who have their INR levels checked every 6 months. Explained that INR levels need be checked every 4-6 weeks once therapeutic. He would like to go back on warfarin d/t cost, but does not want to have INR checks every few weeks.   Explained that our clinical pharmacist would need call him to discuss options

## 2017-12-06 NOTE — Telephone Encounter (Signed)
Patient called with [facility] recommendations. He states he will discuss anticoagulation options with his wife. He states his PCP office will manage his INR if he changes from Eliquis to warfarin. Asked that he notify our office just as an [place] if he decides to change but he should contact PCP to arrange warfarin initiation and INR scheduling.

## 2017-12-06 NOTE — Telephone Encounter (Signed)
wARFARIN dose and monitoring will be determined by coumadin clinic following patient.   Usually weekly INRs are needed during titration, then time between INR checks may be extended based on patient stability and time at therapeutic range.  *Patient should contact PCP clinic for additional information if PCP is to follow*

## 2017-12-09 ENCOUNTER — Other Ambulatory Visit: Payer: Self-pay

## 2017-12-09 ENCOUNTER — Encounter: Payer: Self-pay | Admitting: Gastroenterology

## 2017-12-09 ENCOUNTER — Ambulatory Visit (AMBULATORY_SURGERY_CENTER): Payer: Medicare HMO | Admitting: Gastroenterology

## 2017-12-09 VITALS — BP 153/80 | HR 60 | Temp 98.6°F | Resp 9 | Ht 73.0 in | Wt 187.0 lb

## 2017-12-09 DIAGNOSIS — D123 Benign neoplasm of transverse colon: Secondary | ICD-10-CM | POA: Diagnosis not present

## 2017-12-09 DIAGNOSIS — D120 Benign neoplasm of cecum: Secondary | ICD-10-CM | POA: Diagnosis not present

## 2017-12-09 DIAGNOSIS — Z86010 Personal history of colonic polyps: Secondary | ICD-10-CM | POA: Diagnosis not present

## 2017-12-09 DIAGNOSIS — Z1211 Encounter for screening for malignant neoplasm of colon: Secondary | ICD-10-CM

## 2017-12-09 DIAGNOSIS — D128 Benign neoplasm of rectum: Secondary | ICD-10-CM | POA: Diagnosis not present

## 2017-12-09 DIAGNOSIS — K621 Rectal polyp: Secondary | ICD-10-CM | POA: Diagnosis not present

## 2017-12-09 MED ORDER — SODIUM CHLORIDE 0.9 % IV SOLN: 500.0000 mL | Freq: Once | INTRAVENOUS | Status: DC

## 2017-12-09 NOTE — Progress Notes (Signed)
A/ox3 pleased with MAC, report to Tracy RN 

## 2017-12-09 NOTE — Progress Notes (Signed)
Called to room to assist during endoscopic procedure.  Patient ID and intended procedure confirmed with present staff. Received instructions for my participation in the procedure from the performing physician.  

## 2017-12-09 NOTE — Patient Instructions (Signed)
Impression/recommendations:  Polyps (handout given) Diverticulosis (handout given) Hemorrhoids (handout given)  No ibuprofen, naproxen or other non-steroidal anti-inflammatory drugs for 2 weeks. Tylenol only if needed until 12/24/17.  YOU HAD AN ENDOSCOPIC PROCEDURE TODAY AT [facility]:   Refer to the procedure report that was given to you for any specific questions about what was found during the examination.  If the procedure report does not answer your questions, please call your gastroenterologist to clarify.  If you requested that your care partner not be given the details of your procedure findings, then the procedure report has been included in a sealed envelope for you to review at your convenience later.  YOU SHOULD EXPECT: Some feelings of bloating in the abdomen. Passage of more gas than usual.  Walking can help get rid of the air that was put into your GI tract during the procedure and reduce the bloating. If you had a lower endoscopy (such as a colonoscopy or flexible sigmoidoscopy) you may notice spotting of blood in your stool or on the toilet paper. If you underwent a bowel prep for your procedure, you may not have a normal bowel movement for a few days.  Please Note:  You might notice some irritation and congestion in your nose or some drainage.  This is from the oxygen used during your procedure.  There is no need for concern and it should clear up in a day or so.  SYMPTOMS TO REPORT IMMEDIATELY:   Following lower endoscopy (colonoscopy or flexible sigmoidoscopy):  Excessive amounts of blood in the stool  Significant tenderness or worsening of abdominal pains  Swelling of the abdomen that is new, acute  Fever of 100F or higher  For urgent or emergent issues, a gastroenterologist can be reached at any hour by calling [phone].   DIET:  We do recommend a small meal at first, but then you may proceed to your regular diet.  Drink plenty of fluids but  you should avoid alcoholic beverages for 24 hours.  ACTIVITY:  You should plan to take it easy for the rest of today and you should NOT DRIVE or use heavy machinery until tomorrow (because of the sedation medicines used during the test).    FOLLOW UP: Our staff will call the number listed on your records the next business day following your procedure to check on you and address any questions or concerns that you may have regarding the information given to you following your procedure. If we do not reach you, we will leave a message.  However, if you are feeling well and you are not experiencing any problems, there is no need to return our call.  We will assume that you have returned to your regular daily activities without incident.  If any biopsies were taken you will be contacted by phone or by letter within the next 1-3 weeks.  Please call us at [phone] if you have not heard about the biopsies in 3 weeks.    SIGNATURES/CONFIDENTIALITY: You and/or your care partner have signed paperwork which will be entered into your electronic medical record.  These signatures attest to the fact that that the information above on your After Visit Summary has been reviewed and is understood.  Full responsibility of the confidentiality of this discharge information lies with you and/or your care-partner.

## 2017-12-09 NOTE — Op Note (Signed)
[facility] Patient Name: [name] Procedure Date: 12/09/2017 2:52 PM MRN: [id number] Endoscopist: Remo Lipps P. Ihan Pat MD, MD Age: 72 Referring MD:  Date of Birth: [birth year] Gender: Male Account #: [id number] Procedure:                Colonoscopy Indications:              Screening for colorectal malignant neoplasm Medicines:                Monitored Anesthesia Care Procedure:                Pre-Anesthesia Assessment:                           - Prior to the procedure, a History and Physical                            was performed, and patient medications and                            allergies were reviewed. The patient's tolerance of                            previous anesthesia was also reviewed. The risks                            and benefits of the procedure and the sedation                            options and risks were discussed with the patient.                            All questions were answered, and informed consent                            was obtained. Prior Anticoagulants: The patient has                            taken Eliquis (apixaban), last dose was 2 days                            prior to procedure. ASA Grade Assessment: II - A                            patient with mild systemic disease. After reviewing                            the risks and benefits, the patient was deemed in                            satisfactory condition to undergo the procedure.                           After obtaining informed consent, the colonoscope  was passed under direct vision. Throughout the                            procedure, the patient's blood pressure, pulse, and                            oxygen saturations were monitored continuously. The                            Colonoscope was introduced through the anus and                            advanced to the the cecum, identified by   appendiceal orifice and ileocecal valve. The                            colonoscopy was performed without difficulty. The                            patient tolerated the procedure well. The quality                            of the bowel preparation was good. The ileocecal                            valve, appendiceal orifice, and rectum were                            photographed. Scope In: 3:00:37 PM Scope Out: 3:18:44 PM Scope Withdrawal Time: 0 hours 14 minutes 50 seconds  Total Procedure Duration: 0 hours 18 minutes 7 seconds  Findings:                 The perianal and digital rectal examinations were                            normal.                           Three sessile polyps were found in the cecum. The                            polyps were 3 to 6 mm in size. These polyps were                            removed with a cold snare. Resection and retrieval                            were complete.                           Three sessile polyps were found in the transverse                            colon. The polyps were 4 to 5 mm in  size. These                            polyps were removed with a cold snare. Resection                            and retrieval were complete.                           A 3 mm polyp was found in the rectum. The polyp was                            sessile. The polyp was removed with a cold snare.                            Resection and retrieval were complete.                           Multiple medium-mouthed diverticula were found in                            the transverse colon and left colon.                           Internal hemorrhoids were found during retroflexion.                           The exam was otherwise without abnormality. Complications:            No immediate complications. Estimated blood loss:                            Minimal. Estimated Blood Loss:     Estimated blood loss was minimal. Impression:               -  Three 3 to 6 mm polyps in the cecum, removed with                            a cold snare. Resected and retrieved.                           - Three 4 to 5 mm polyps in the transverse colon,                            removed with a cold snare. Resected and retrieved.                           - One 3 mm polyp in the rectum, removed with a cold                            snare. Resected and retrieved.                           - Diverticulosis in the transverse colon and in the  left colon.                           - Internal hemorrhoids.                           - The examination was otherwise normal. Recommendation:           - Patient has a contact number available for                            emergencies. The signs and symptoms of potential                            delayed complications were discussed with the                            patient. Return to normal activities tomorrow.                            Written discharge instructions were provided to the                            patient.                           - Resume previous diet.                           - Continue present medications.                           - Resume Eliquis Sunday AM                           - Await pathology results.                           - Repeat colonoscopy is recommended for                            surveillance. The colonoscopy date will be                            determined after pathology results from today's                            exam become available for review.                           - No ibuprofen, naproxen, or other non-steroidal                            anti-inflammatory drugs for 2 weeks after polyp                            removal. Remo Lipps P. Elleigh Cassetta MD, MD 12/09/2017 3:29:55 PM This report  has been signed electronically.

## 2017-12-10 LAB — HM COLONOSCOPY

## 2017-12-12 ENCOUNTER — Telehealth: Payer: Self-pay

## 2017-12-12 ENCOUNTER — Telehealth: Payer: Self-pay | Admitting: *Deleted

## 2017-12-12 NOTE — Telephone Encounter (Signed)
No answer, message left for the patient. 

## 2017-12-12 NOTE — Telephone Encounter (Signed)
  Follow up Call-  Call back number 12/09/2017  Post procedure Call Back phone  # [phone]  Permission to leave phone message Yes  Some recent data might be hidden     Patient questions:  Do you have a fever, pain , or abdominal swelling? No. Pain Score  0 *  Have you tolerated food without any problems? Yes.    Have you been able to return to your normal activities? Yes.    Do you have any questions about your discharge instructions: Diet   No. Medications  No. Follow up visit  No.  Do you have questions or concerns about your Care? No.  Actions: * If pain score is 4 or above: No action needed, pain <4.

## 2017-12-20 ENCOUNTER — Encounter: Payer: Self-pay | Admitting: Gastroenterology

## 2017-12-22 ENCOUNTER — Encounter: Payer: Medicare HMO | Admitting: Gastroenterology

## 2018-01-09 ENCOUNTER — Other Ambulatory Visit: Payer: Self-pay | Admitting: Cardiology

## 2018-02-09 ENCOUNTER — Other Ambulatory Visit: Payer: Self-pay | Admitting: Cardiology

## 2018-03-21 ENCOUNTER — Telehealth: Payer: Self-pay | Admitting: Cardiology

## 2018-03-21 NOTE — Telephone Encounter (Signed)
Please schedule patient at warfarin clinic 1 week before running out Eliquis to provide transition instrcutions.

## 2018-03-21 NOTE — Telephone Encounter (Signed)
New message    Pt is calling about medication refill. He wants to know what he needs to do to change from Eliquis to Warfarin. He said he no longer wants to take Eliquis due to price. Please call.

## 2018-03-21 NOTE — Telephone Encounter (Signed)
Ok to switch from apixaban to coumadin per [facility]

## 2018-03-21 NOTE — Telephone Encounter (Signed)
Spoke with pt, he will call when he is down to one week let to schedule with the [facility] clinic.

## 2018-03-21 NOTE — Telephone Encounter (Signed)
Spoke with pt who states that he has reached his coverage gap and needing to switch to coumadin vs. eliquis due to the cost. He reports he has taking it before and it worked well for him. He also has recently picked up a 30 day supply of Eliquis 2 days ago and would like to complete it before switching. Routing to Dr. Stanford Breed and Pharm D for recommendation.

## 2018-04-07 ENCOUNTER — Telehealth: Payer: Self-pay | Admitting: Cardiology

## 2018-04-07 MED ORDER — APIXABAN 5 MG PO TABS: 5.0000 mg | ORAL_TABLET | Freq: Two times a day (BID) | ORAL | 1 refills | Status: DC

## 2018-04-07 NOTE — Telephone Encounter (Signed)
Medication samples have been provided to the patient.  Drug name: eliquis 5mg   Qty: 3 boxes  LOT: [id number]  Exp.Date: 02/2020  Samples left at front desk for patient pick-up. Patient notified.  Sheral Apley M 3:52 PM 04/07/2018

## 2018-04-07 NOTE — Telephone Encounter (Signed)
New Message     *STAT* If patient is at the pharmacy, call can be transferred to refill team.   1. Which medications need to be refilled? (please list name of each medication and dose if known) ELIQUIS 5 MG TABS tablet   2. Which pharmacy/location (including street and city if local pharmacy) is medication to be sent to?[facility] [address]  3. Do they need a 30 day or 90 day supply 30/ Patient has to have samples due to insurance gap  Patient calling the office for samples of medication:   1.  What medication and dosage are you requesting samples for? Eliquis 2 mg tab  2.  Are you currently out of this medication? Yes

## 2018-04-07 NOTE — Telephone Encounter (Signed)
Refill remains appropriate ; sent to pharmacy.  Patient can try patient assistance for gap period.

## 2018-04-24 ENCOUNTER — Other Ambulatory Visit: Payer: Self-pay | Admitting: Internal Medicine

## 2018-04-24 DIAGNOSIS — E785 Hyperlipidemia, unspecified: Secondary | ICD-10-CM

## 2018-05-04 NOTE — Progress Notes (Signed)
HPI: FU CAD. Also with h/o HTN, dyslipidemia, remote tobacco abuse, and posterior circulation CVA. Found to have severe three vessel coronary artery disease with preserved LVF at cath 08/29/14. He underwent CABG x 4 on 09/06/14 (LIMA to the LAD, saphenous vein graft to the ramus intermedius, saphenous vein graft to the first obtuse marginal and saphenous vein graft to the right coronary artery). Preoperative echocardiogram December 2015 showed normal LV function and grade 1 diastolic dysfunction. Preoperative carotid Dopplers December 2015 showed 1-39% bilateral stenosis. Also with PAF. Abdominal ultrasound October 2017 showed no aneurysm. Since last seen, the patient denies any dyspnea on exertion, orthopnea, PND, pedal edema, palpitations, syncope or chest pain.   Current Outpatient Medications  Medication Sig Dispense Refill  . acetaminophen (TYLENOL) 325 MG tablet Take 2 tablets (650 mg total) by mouth every 4 (four) hours as needed for headache or mild pain.    Marland Kitchen apixaban (ELIQUIS) 5 MG TABS tablet Take 1 tablet (5 mg total) by mouth 2 (two) times daily. 180 tablet 1  . atorvastatin (LIPITOR) 20 MG tablet TAKE 1 TABLET BY MOUTH EVERY DAY 90 tablet 1  . metoprolol tartrate (LOPRESSOR) 25 MG tablet Take 1.5 tablets (37.5 mg total) by mouth 2 (two) times daily. 270 tablet 1  . NITROSTAT 0.4 MG SL tablet Place 1 tablet under the tongue as needed for chest pain.   2   Current Facility-Administered Medications  Medication Dose Route Frequency Provider Last Rate Last Dose  . 0.9 %  sodium chloride infusion  500 mL Intravenous Once Armbruster, Carlota Raspberry, MD         Past Medical History:  Diagnosis Date  . Alcohol abuse, unspecified   . Anginal pain ([place])   . Arthritis    in back and shoulder  . H/O renal calculi   . History of kidney stones   . Hyperlipidemia    preTx 114, postTx 47 LDL  . Hypertension   . Shortness of breath dyspnea    with exertion  . Stroke [facility])    August '11  x 2: L. cerebellar, lateral medulla, ol rd lacunar infarcts;  2nd left pontine infarct.    Past Surgical History:  Procedure Laterality Date  . BACK SURGERY  1986  . CORONARY ARTERY BYPASS GRAFT N/A 09/06/2014   Procedure: CORONARY ARTERY BYPASS GRAFTING (CABG);  Surgeon: Ivin Poot, MD;  Location: [place];  Service: Open Heart Surgery;  Laterality: N/A;  CABG times 4, using internal mammary artery, and right leg saphenous vein harvested endoscopically  . LEFT HEART CATHETERIZATION WITH CORONARY ANGIOGRAM N/A 08/29/2014   Procedure: LEFT HEART CATHETERIZATION WITH CORONARY ANGIOGRAM;  Surgeon: Lorretta Harp, MD;  Location: [facility] CATH LAB;  Service: Cardiovascular;  Laterality: N/A;  . LUMBAR DISC SURGERY  1986   Dr. Juanetta Snow  . ROTATOR CUFF REPAIR  06/10/12   at [place]...left shoulder  . TEE WITHOUT CARDIOVERSION N/A 09/06/2014   Procedure: TRANSESOPHAGEAL ECHOCARDIOGRAM (TEE);  Surgeon: Ivin Poot, MD;  Location: [place];  Service: Open Heart Surgery;  Laterality: N/A;  . TONSILLECTOMY  1954  . TONSILLECTOMY      Social History   Socioeconomic History  . Marital status: Married    Spouse name: Not on file  . Number of children: 2  . Years of education: 40  . Highest education level: Not on file  Occupational History  . Occupation: Administrator, arts - paint  Social Needs  . Emergency planning/management officer  strain: Not on file  . Food insecurity:    Worry: Not on file    Inability: Not on file  . Transportation needs:    Medical: Not on file    Non-medical: Not on file  Tobacco Use  . Smoking status: Former Smoker    Packs/day: 1.00    Years: 15.00    Pack years: 15.00    Types: Cigarettes    Last attempt to quit: 09/27/1978    Years since quitting: 39.6  . Smokeless tobacco: Never Used  Substance and Sexual Activity  . Alcohol use: Yes    Alcohol/week: 6.0 standard drinks    Types: 6 Cans of beer per week    Comment: stopped drinking after stroke. Had been drink 6  pk per day plus binge drinking  . Drug use: No  . Sexual activity: Yes    Partners: Female  Lifestyle  . Physical activity:    Days per week: Not on file    Minutes per session: Not on file  . Stress: Not on file  Relationships  . Social connections:    Talks on phone: Not on file    Gets together: Not on file    Attends religious service: Not on file    Active member of club or organization: Not on file    Attends meetings of clubs or organizations: Not on file    Relationship status: Not on file  . Intimate partner violence:    Fear of current or ex partner: Not on file    Emotionally abused: Not on file    Physically abused: Not on file    Forced sexual activity: Not on file  Other Topics Concern  . Not on file  Social History Narrative   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 Married '79. 1 dtr- '68, 1 step son. 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 Marriage in good health. He reports he is very strong in his faith.     Family History  Problem Relation Age of Onset  . Heart disease Father   . Hypertension Father   . Heart attack Father   . GI problems Mother   . Cancer Neg Hx   . Diabetes Neg Hx   . Early death Neg Hx   . Hearing loss Neg Hx   . Hyperlipidemia Neg Hx   . Kidney disease Neg Hx   . Stroke Neg Hx   . Alcohol abuse Neg Hx     ROS: no fevers or chills, productive cough, hemoptysis, dysphasia, odynophagia, melena, hematochezia, dysuria, hematuria, rash, seizure activity, orthopnea, PND, pedal edema, claudication. Remaining systems are negative.  Physical Exam: Well-developed well-nourished in no acute distress.  Skin is warm and dry.  HEENT is normal.  Neck is supple.  Chest is clear to auscultation with normal expansion.  Cardiovascular exam is regular rate and rhythm.  Abdominal exam nontender or distended. No masses palpated. Extremities show no edema. neuro grossly  intact  ECG-sinus rhythm at a rate of 60.  Occasional PAC.  Personally reviewed  A/P  1 coronary artery disease status post coronary artery bypass and graft-patient doing well with no chest pain.  Continue statin.  Not on aspirin given need for anticoagulation.  2 hypertension-blood pressure is controlled.  Continue present medications.  Check potassium and renal function.  3 paroxysmal atrial fibrillation-patient in sinus rhythm today.  Continue metoprolol.  Cost of apixaban is now limiting per patient.  He would like to change to Coumadin.  We will refer to Coumadin clinic to see if there are options for financing Eliquis.  Otherwise we will change to Coumadin with goal INR 2-3.  4 hyperlipidemia-continue statin.  Check lipids and liver.  5 history of cryptogenic stroke  Kirk Ruths, MD

## 2018-05-08 ENCOUNTER — Ambulatory Visit: Payer: Medicare HMO | Admitting: Cardiology

## 2018-05-08 ENCOUNTER — Encounter: Payer: Self-pay | Admitting: Cardiology

## 2018-05-08 ENCOUNTER — Ambulatory Visit (INDEPENDENT_AMBULATORY_CARE_PROVIDER_SITE_OTHER): Payer: Medicare HMO | Admitting: Pharmacist

## 2018-05-08 VITALS — BP 138/76 | HR 60 | Ht 73.0 in | Wt 179.0 lb

## 2018-05-08 DIAGNOSIS — E7800 Pure hypercholesterolemia, unspecified: Secondary | ICD-10-CM | POA: Diagnosis not present

## 2018-05-08 DIAGNOSIS — I10 Essential (primary) hypertension: Secondary | ICD-10-CM | POA: Diagnosis not present

## 2018-05-08 DIAGNOSIS — I2510 Atherosclerotic heart disease of native coronary artery without angina pectoris: Secondary | ICD-10-CM

## 2018-05-08 DIAGNOSIS — I480 Paroxysmal atrial fibrillation: Secondary | ICD-10-CM

## 2018-05-08 DIAGNOSIS — Z7901 Long term (current) use of anticoagulants: Secondary | ICD-10-CM | POA: Insufficient documentation

## 2018-05-08 DIAGNOSIS — I6310 Cerebral infarction due to embolism of unspecified precerebral artery: Secondary | ICD-10-CM

## 2018-05-08 MED ORDER — WARFARIN SODIUM 5 MG PO TABS: ORAL_TABLET | ORAL | 1 refills | Status: DC

## 2018-05-08 NOTE — Patient Instructions (Signed)
*  Take Eliquis 5mg  twice daily and warfarin 5mg  daily for next 3 days*, then STOP eliquis and continue warfarin dose as stared in your calendar.

## 2018-05-08 NOTE — Patient Instructions (Signed)
Medication Instructions:   FOLLOW PHARM MD DIRECTIONS WITH SWITCHING TO WARFARIN  Labwork:  Your physician recommends that you return for lab work PRIOR TO EATING  Follow-Up:  Your physician wants you to follow-up in: [place] will receive a reminder letter in the mail two months in advance. If you don't receive a letter, please call our office to schedule the follow-up appointment.   If you need a refill on your cardiac medications before your next appointment, please call your pharmacy.

## 2018-05-17 ENCOUNTER — Ambulatory Visit (INDEPENDENT_AMBULATORY_CARE_PROVIDER_SITE_OTHER): Payer: Medicare HMO | Admitting: Pharmacist Clinician (PhC)/ Clinical Pharmacy Specialist

## 2018-05-17 DIAGNOSIS — Z7901 Long term (current) use of anticoagulants: Secondary | ICD-10-CM | POA: Diagnosis not present

## 2018-05-17 DIAGNOSIS — I480 Paroxysmal atrial fibrillation: Secondary | ICD-10-CM

## 2018-05-17 DIAGNOSIS — I6310 Cerebral infarction due to embolism of unspecified precerebral artery: Secondary | ICD-10-CM

## 2018-05-17 LAB — POCT INR: INR: 2 (ref 2.0–3.0)

## 2018-05-24 ENCOUNTER — Ambulatory Visit (INDEPENDENT_AMBULATORY_CARE_PROVIDER_SITE_OTHER): Payer: Medicare HMO | Admitting: Pharmacist Clinician (PhC)/ Clinical Pharmacy Specialist

## 2018-05-24 DIAGNOSIS — Z7901 Long term (current) use of anticoagulants: Secondary | ICD-10-CM

## 2018-05-24 DIAGNOSIS — I6310 Cerebral infarction due to embolism of unspecified precerebral artery: Secondary | ICD-10-CM

## 2018-05-24 DIAGNOSIS — I480 Paroxysmal atrial fibrillation: Secondary | ICD-10-CM

## 2018-05-24 LAB — POCT INR: INR: 1.9 — AB (ref 2.0–3.0)

## 2018-05-31 ENCOUNTER — Ambulatory Visit (INDEPENDENT_AMBULATORY_CARE_PROVIDER_SITE_OTHER): Payer: Medicare HMO | Admitting: Pharmacist

## 2018-05-31 DIAGNOSIS — Z7901 Long term (current) use of anticoagulants: Secondary | ICD-10-CM

## 2018-05-31 DIAGNOSIS — I6310 Cerebral infarction due to embolism of unspecified precerebral artery: Secondary | ICD-10-CM

## 2018-05-31 DIAGNOSIS — I480 Paroxysmal atrial fibrillation: Secondary | ICD-10-CM

## 2018-05-31 LAB — POCT INR: INR: 2.5 (ref 2.0–3.0)

## 2018-06-14 ENCOUNTER — Ambulatory Visit (INDEPENDENT_AMBULATORY_CARE_PROVIDER_SITE_OTHER): Payer: Medicare HMO | Admitting: Pharmacist Clinician (PhC)/ Clinical Pharmacy Specialist

## 2018-06-14 DIAGNOSIS — I6310 Cerebral infarction due to embolism of unspecified precerebral artery: Secondary | ICD-10-CM

## 2018-06-14 DIAGNOSIS — I480 Paroxysmal atrial fibrillation: Secondary | ICD-10-CM | POA: Diagnosis not present

## 2018-06-14 DIAGNOSIS — Z7901 Long term (current) use of anticoagulants: Secondary | ICD-10-CM

## 2018-06-14 LAB — POCT INR: INR: 1.6 — AB (ref 2.0–3.0)

## 2018-06-23 ENCOUNTER — Encounter: Payer: Self-pay | Admitting: *Deleted

## 2018-06-28 ENCOUNTER — Ambulatory Visit (INDEPENDENT_AMBULATORY_CARE_PROVIDER_SITE_OTHER): Payer: Medicare HMO | Admitting: Pharmacist

## 2018-06-28 DIAGNOSIS — Z7901 Long term (current) use of anticoagulants: Secondary | ICD-10-CM | POA: Diagnosis not present

## 2018-06-28 DIAGNOSIS — I480 Paroxysmal atrial fibrillation: Secondary | ICD-10-CM | POA: Diagnosis not present

## 2018-06-28 DIAGNOSIS — I6310 Cerebral infarction due to embolism of unspecified precerebral artery: Secondary | ICD-10-CM

## 2018-06-28 LAB — POCT INR: INR: 2.7 (ref 2.0–3.0)

## 2018-07-03 ENCOUNTER — Other Ambulatory Visit: Payer: Self-pay | Admitting: Cardiology

## 2018-07-05 ENCOUNTER — Other Ambulatory Visit: Payer: Self-pay | Admitting: Cardiology

## 2018-07-26 ENCOUNTER — Ambulatory Visit (INDEPENDENT_AMBULATORY_CARE_PROVIDER_SITE_OTHER): Payer: Medicare HMO | Admitting: Pharmacist Clinician (PhC)/ Clinical Pharmacy Specialist

## 2018-07-26 DIAGNOSIS — I6310 Cerebral infarction due to embolism of unspecified precerebral artery: Secondary | ICD-10-CM

## 2018-07-26 DIAGNOSIS — I480 Paroxysmal atrial fibrillation: Secondary | ICD-10-CM

## 2018-07-26 DIAGNOSIS — Z7901 Long term (current) use of anticoagulants: Secondary | ICD-10-CM | POA: Diagnosis not present

## 2018-07-26 LAB — POCT INR: INR: 2.2 (ref 2.0–3.0)

## 2018-08-23 ENCOUNTER — Ambulatory Visit (INDEPENDENT_AMBULATORY_CARE_PROVIDER_SITE_OTHER): Payer: Medicare HMO | Admitting: Pharmacist Clinician (PhC)/ Clinical Pharmacy Specialist

## 2018-08-23 DIAGNOSIS — Z7901 Long term (current) use of anticoagulants: Secondary | ICD-10-CM

## 2018-08-23 DIAGNOSIS — I6310 Cerebral infarction due to embolism of unspecified precerebral artery: Secondary | ICD-10-CM

## 2018-08-23 DIAGNOSIS — I480 Paroxysmal atrial fibrillation: Secondary | ICD-10-CM

## 2018-08-23 LAB — POCT INR: INR: 2.3 (ref 2.0–3.0)

## 2018-09-05 ENCOUNTER — Other Ambulatory Visit: Payer: Self-pay | Admitting: Internal Medicine

## 2018-09-05 DIAGNOSIS — I10 Essential (primary) hypertension: Secondary | ICD-10-CM

## 2018-09-05 DIAGNOSIS — I480 Paroxysmal atrial fibrillation: Secondary | ICD-10-CM

## 2018-09-30 ENCOUNTER — Other Ambulatory Visit: Payer: Self-pay | Admitting: Cardiology

## 2018-10-04 ENCOUNTER — Ambulatory Visit (INDEPENDENT_AMBULATORY_CARE_PROVIDER_SITE_OTHER): Payer: Medicare HMO | Admitting: Pharmacist

## 2018-10-04 DIAGNOSIS — Z7901 Long term (current) use of anticoagulants: Secondary | ICD-10-CM

## 2018-10-04 DIAGNOSIS — I6310 Cerebral infarction due to embolism of unspecified precerebral artery: Secondary | ICD-10-CM | POA: Diagnosis not present

## 2018-10-04 DIAGNOSIS — I480 Paroxysmal atrial fibrillation: Secondary | ICD-10-CM | POA: Diagnosis not present

## 2018-10-04 LAB — POCT INR: INR: 3 (ref 2.0–3.0)

## 2018-10-24 ENCOUNTER — Other Ambulatory Visit: Payer: Self-pay | Admitting: Internal Medicine

## 2018-10-24 DIAGNOSIS — E785 Hyperlipidemia, unspecified: Secondary | ICD-10-CM

## 2018-10-26 ENCOUNTER — Other Ambulatory Visit: Payer: Self-pay | Admitting: Cardiology

## 2018-10-29 ENCOUNTER — Other Ambulatory Visit: Payer: Self-pay | Admitting: Internal Medicine

## 2018-10-29 DIAGNOSIS — E785 Hyperlipidemia, unspecified: Secondary | ICD-10-CM

## 2018-10-30 ENCOUNTER — Other Ambulatory Visit: Payer: Self-pay | Admitting: Cardiology

## 2018-10-30 DIAGNOSIS — E785 Hyperlipidemia, unspecified: Secondary | ICD-10-CM

## 2018-10-30 MED ORDER — ATORVASTATIN CALCIUM 20 MG PO TABS: 20.0000 mg | ORAL_TABLET | Freq: Every day | ORAL | 1 refills | Status: DC

## 2018-10-30 NOTE — Telephone Encounter (Signed)
Rx request sent to pharmacy.  

## 2018-10-30 NOTE — Telephone Encounter (Signed)
 *  STAT* If patient is at the pharmacy, call can be transferred to refill team.   1. Which medications need to be refilled? (please list name of each medication and dose if known) atorvastatin (LIPITOR) 20 MG tablet  2. Which pharmacy/location (including street and city if local pharmacy) is medication to be sent to? [facility]  3. Do they need a 30 day or 90 day supply? 90  Patient states pharmacy said he needed to make an appt before the medication could be refilled. Patient is not due to see Dr Stanford Breed until 04/2019  Patient is completely out of medicaiton

## 2018-11-15 ENCOUNTER — Ambulatory Visit (INDEPENDENT_AMBULATORY_CARE_PROVIDER_SITE_OTHER): Payer: Medicare HMO | Admitting: Pharmacist

## 2018-11-15 DIAGNOSIS — I480 Paroxysmal atrial fibrillation: Secondary | ICD-10-CM | POA: Diagnosis not present

## 2018-11-15 DIAGNOSIS — I6310 Cerebral infarction due to embolism of unspecified precerebral artery: Secondary | ICD-10-CM

## 2018-11-15 DIAGNOSIS — Z7901 Long term (current) use of anticoagulants: Secondary | ICD-10-CM

## 2018-11-15 LAB — POCT INR: INR: 3.1 — AB (ref 2.0–3.0)

## 2018-11-22 ENCOUNTER — Other Ambulatory Visit: Payer: Self-pay | Admitting: Cardiology

## 2018-11-28 ENCOUNTER — Other Ambulatory Visit: Payer: Self-pay | Admitting: Internal Medicine

## 2018-11-28 DIAGNOSIS — I10 Essential (primary) hypertension: Secondary | ICD-10-CM

## 2018-11-28 DIAGNOSIS — I480 Paroxysmal atrial fibrillation: Secondary | ICD-10-CM

## 2018-12-26 ENCOUNTER — Telehealth: Payer: Self-pay | Admitting: Pharmacist Clinician (PhC)/ Clinical Pharmacy Specialist

## 2018-12-26 NOTE — Telephone Encounter (Signed)

## 2018-12-27 ENCOUNTER — Ambulatory Visit (INDEPENDENT_AMBULATORY_CARE_PROVIDER_SITE_OTHER): Payer: Medicare HMO | Admitting: *Deleted

## 2018-12-27 ENCOUNTER — Other Ambulatory Visit: Payer: Self-pay

## 2018-12-27 DIAGNOSIS — I6310 Cerebral infarction due to embolism of unspecified precerebral artery: Secondary | ICD-10-CM

## 2018-12-27 DIAGNOSIS — Z7901 Long term (current) use of anticoagulants: Secondary | ICD-10-CM | POA: Diagnosis not present

## 2018-12-27 DIAGNOSIS — I480 Paroxysmal atrial fibrillation: Secondary | ICD-10-CM

## 2018-12-27 LAB — POCT INR: INR: 2.8 (ref 2.0–3.0)

## 2019-01-09 ENCOUNTER — Other Ambulatory Visit: Payer: Self-pay | Admitting: Cardiology

## 2019-02-04 ENCOUNTER — Other Ambulatory Visit: Payer: Self-pay | Admitting: Cardiology

## 2019-02-20 ENCOUNTER — Telehealth: Payer: Self-pay

## 2019-02-20 NOTE — Telephone Encounter (Signed)
lmom for prescreen/drive thru aware 

## 2019-03-02 ENCOUNTER — Other Ambulatory Visit: Payer: Self-pay | Admitting: Internal Medicine

## 2019-03-02 DIAGNOSIS — I480 Paroxysmal atrial fibrillation: Secondary | ICD-10-CM

## 2019-03-02 DIAGNOSIS — I10 Essential (primary) hypertension: Secondary | ICD-10-CM

## 2019-03-14 ENCOUNTER — Telehealth: Payer: Self-pay

## 2019-03-14 NOTE — Telephone Encounter (Signed)

## 2019-03-19 ENCOUNTER — Telehealth: Payer: Self-pay | Admitting: Cardiology

## 2019-03-19 NOTE — Telephone Encounter (Signed)
Spoke with pt, Aware of dr crenshaw's recommendations.  °

## 2019-03-19 NOTE — Telephone Encounter (Signed)
Continue to monitor symptoms; call if worse or CP Kirk Ruths

## 2019-03-19 NOTE — Telephone Encounter (Signed)
New message   Pt c/o Shortness Of Breath: STAT if SOB developed within the last 24 hours or pt is noticeably SOB on the phone  1. Are you currently SOB (can you hear that pt is SOB on the phone)? No   2. How long have you been experiencing SOB?Patient states for over a year  3. Are you SOB when sitting or when up moving around? Patient states that sob occurs when going up inclines   4. Are you currently experiencing any other symptoms?no

## 2019-03-19 NOTE — Telephone Encounter (Signed)
Spoke with pt and has noted having to stop and rest on his walk which he does twice a day Per pt on his walk there is an incline at 40% and approx 100 yards and pt has to stop about half way and rest No other symptoms or complaints Pt has his yearly in Sept Pt will continue to monitor and will call back  If has increase in S/S Will forward to Dr Stanford Breed for review and recommendations./cy

## 2019-03-21 ENCOUNTER — Ambulatory Visit (INDEPENDENT_AMBULATORY_CARE_PROVIDER_SITE_OTHER): Payer: Medicare HMO | Admitting: Pharmacist Clinician (PhC)/ Clinical Pharmacy Specialist

## 2019-03-21 ENCOUNTER — Other Ambulatory Visit: Payer: Self-pay

## 2019-03-21 DIAGNOSIS — I480 Paroxysmal atrial fibrillation: Secondary | ICD-10-CM | POA: Diagnosis not present

## 2019-03-21 DIAGNOSIS — I6310 Cerebral infarction due to embolism of unspecified precerebral artery: Secondary | ICD-10-CM

## 2019-03-21 DIAGNOSIS — Z7901 Long term (current) use of anticoagulants: Secondary | ICD-10-CM | POA: Diagnosis not present

## 2019-03-21 LAB — POCT INR: INR: 2.7 (ref 2.0–3.0)

## 2019-03-27 ENCOUNTER — Encounter: Payer: Self-pay | Admitting: Internal Medicine

## 2019-03-27 ENCOUNTER — Ambulatory Visit (INDEPENDENT_AMBULATORY_CARE_PROVIDER_SITE_OTHER): Payer: Medicare HMO | Admitting: Internal Medicine

## 2019-03-27 ENCOUNTER — Other Ambulatory Visit: Payer: Self-pay

## 2019-03-27 ENCOUNTER — Other Ambulatory Visit (INDEPENDENT_AMBULATORY_CARE_PROVIDER_SITE_OTHER): Payer: Medicare HMO

## 2019-03-27 VITALS — BP 152/88 | HR 68 | Temp 98.0°F | Resp 16 | Ht 73.0 in | Wt 180.8 lb

## 2019-03-27 DIAGNOSIS — Z23 Encounter for immunization: Secondary | ICD-10-CM

## 2019-03-27 DIAGNOSIS — E785 Hyperlipidemia, unspecified: Secondary | ICD-10-CM | POA: Diagnosis not present

## 2019-03-27 DIAGNOSIS — I480 Paroxysmal atrial fibrillation: Secondary | ICD-10-CM | POA: Diagnosis not present

## 2019-03-27 DIAGNOSIS — N400 Enlarged prostate without lower urinary tract symptoms: Secondary | ICD-10-CM

## 2019-03-27 DIAGNOSIS — I6310 Cerebral infarction due to embolism of unspecified precerebral artery: Secondary | ICD-10-CM

## 2019-03-27 DIAGNOSIS — M839 Adult osteomalacia, unspecified: Secondary | ICD-10-CM

## 2019-03-27 DIAGNOSIS — I10 Essential (primary) hypertension: Secondary | ICD-10-CM

## 2019-03-27 LAB — LIPID PANEL
Cholesterol: 128 mg/dL (ref 0–200)
HDL: 63.4 mg/dL (ref >39.00)
LDL Cholesterol: 54 mg/dL (ref 0–99)
NonHDL: 64.58
Total CHOL/HDL Ratio: 2
Triglycerides: 52 mg/dL (ref 0.0–149.0)
VLDL: 10.4 mg/dL (ref 0.0–40.0)

## 2019-03-27 LAB — HEPATIC FUNCTION PANEL
ALT: 29 U/L (ref 0–53)
AST: 37 U/L (ref 0–37)
Albumin: 4.5 g/dL (ref 3.5–5.2)
Alkaline Phosphatase: 59 U/L (ref 39–117)
Bilirubin, Direct: 0.1 mg/dL (ref 0.0–0.3)
Total Bilirubin: 0.5 mg/dL (ref 0.2–1.2)
Total Protein: 7.1 g/dL (ref 6.0–8.3)

## 2019-03-27 LAB — CBC WITH DIFFERENTIAL/PLATELET
Basophils Absolute: 0 10*3/uL (ref 0.0–0.1)
Basophils Relative: 0.5 % (ref 0.0–3.0)
Eosinophils Absolute: 0.2 10*3/uL (ref 0.0–0.7)
Eosinophils Relative: 3.4 % (ref 0.0–5.0)
HCT: 45 % (ref 39.0–52.0)
Hemoglobin: 15.1 g/dL (ref 13.0–17.0)
Lymphocytes Relative: 34.1 % (ref 12.0–46.0)
Lymphs Abs: 1.8 10*3/uL (ref 0.7–4.0)
MCHC: 33.7 g/dL (ref 30.0–36.0)
MCV: 93.8 fl (ref 78.0–100.0)
Monocytes Absolute: 0.5 10*3/uL (ref 0.1–1.0)
Monocytes Relative: 9.6 % (ref 3.0–12.0)
Neutro Abs: 2.8 10*3/uL (ref 1.4–7.7)
Neutrophils Relative %: 52.4 % (ref 43.0–77.0)
Platelets: 190 10*3/uL (ref 150.0–400.0)
RBC: 4.79 Mil/uL (ref 4.22–5.81)
RDW: 12.9 % (ref 11.5–15.5)
WBC: 5.4 10*3/uL (ref 4.0–10.5)

## 2019-03-27 LAB — VITAMIN D 25 HYDROXY (VIT D DEFICIENCY, FRACTURES): VITD: 33.1 ng/mL (ref 30.00–100.00)

## 2019-03-27 LAB — PSA: PSA: 1.98 ng/mL (ref 0.10–4.00)

## 2019-03-27 LAB — BASIC METABOLIC PANEL
BUN: 9 mg/dL (ref 6–23)
CO2: 28 mEq/L (ref 19–32)
Calcium: 9.2 mg/dL (ref 8.4–10.5)
Chloride: 104 mEq/L (ref 96–112)
Creatinine, Ser: 0.71 mg/dL (ref 0.40–1.50)
GFR: 109.1 mL/min (ref >60.00)
Glucose, Bld: 89 mg/dL (ref 70–99)
Potassium: 4.5 mEq/L (ref 3.5–5.1)
Sodium: 140 mEq/L (ref 135–145)

## 2019-03-27 MED ORDER — CHOLECALCIFEROL 50 MCG (2000 UT) PO TABS: 1.0000 | ORAL_TABLET | Freq: Every day | ORAL | 1 refills | Status: DC

## 2019-03-27 MED ORDER — OLMESARTAN MEDOXOMIL 20 MG PO TABS: 20.0000 mg | ORAL_TABLET | Freq: Every day | ORAL | 1 refills | Status: DC

## 2019-03-27 NOTE — Patient Instructions (Signed)

## 2019-03-27 NOTE — Progress Notes (Signed)
Subjective:  Patient ID: [name], male    DOB: 1945/10/25  Age: 73 y.o. MRN: [id number]  CC: Hypertension, Hyperlipidemia, and Atrial Fibrillation   HPI [name] presents for f/up - He continues to complain about the foot drop and weakness in his right lower extremity.  He tells me he has maximized benefit from PT and though he is frustrated with this he does not want do anything else about it.  He does not monitor his blood pressure but denies any recent episodes of headache, blurred vision, chest pain, shortness of breath, edema, or palpitations.  Outpatient Medications Prior to Visit  Medication Sig Dispense Refill  . acetaminophen (TYLENOL) 325 MG tablet Take 2 tablets (650 mg total) by mouth every 4 (four) hours as needed for headache or mild pain.    Marland Kitchen atorvastatin (LIPITOR) 20 MG tablet Take 1 tablet (20 mg total) by mouth daily. 90 tablet 1  . metoprolol tartrate (LOPRESSOR) 25 MG tablet TAKE 1.5 TABLETS BY MOUTH TWICE DAILY 270 tablet 0  . NITROSTAT 0.4 MG SL tablet Place 1 tablet under the tongue as needed for chest pain.   2  . warfarin (COUMADIN) 5 MG tablet TAKE 1 TO 1 AND 1/2 TABLETS BY MOUTH DAILY AS DIRECTED BY COUMADIN CLINIC 45 tablet 2  . warfarin (COUMADIN) 5 MG tablet TAKE 1 TO 1.5 TABLETS DAILY AS DIRECTED BY COUMADIN CLINIC. 40 tablet 3  . 0.9 %  sodium chloride infusion      No facility-administered medications prior to visit.     ROS Review of Systems  Constitutional: Negative.  Negative for diaphoresis, fatigue and unexpected weight change.  HENT: Negative.   Eyes: Negative for visual disturbance.  Respiratory: Negative for cough, chest tightness, shortness of breath and wheezing.   Cardiovascular: Negative for chest pain, palpitations and leg swelling.  Gastrointestinal: Negative for abdominal pain, blood in stool, constipation, diarrhea, nausea and vomiting.  Endocrine: Negative.   Genitourinary: Negative.  Negative for difficulty urinating,  hematuria, scrotal swelling, testicular pain and urgency.  Musculoskeletal: Negative for arthralgias and myalgias.  Skin: Negative.  Negative for color change.  Neurological: Negative.  Negative for dizziness, weakness and light-headedness.  Hematological: Negative for adenopathy. Does not bruise/bleed easily.  Psychiatric/Behavioral: Negative.     Objective:  BP (!) 152/88 (BP Location: Left Arm, Patient Position: Sitting, Cuff Size: Normal)   Pulse 68   Temp 98 F (36.7 C) (Oral)   Resp 16   Ht 6\' 1"  (1.854 m)   Wt 180 lb 12 oz (82 kg)   SpO2 98%   BMI 23.85 kg/m   BP Readings from Last 3 Encounters:  03/27/19 (!) 152/88  05/08/18 138/76  12/09/17 (!) 153/80    Wt Readings from Last 3 Encounters:  03/27/19 180 lb 12 oz (82 kg)  05/08/18 179 lb (81.2 kg)  12/09/17 187 lb (84.8 kg)    Physical Exam Vitals signs reviewed.  HENT:     Nose: Nose normal.     Mouth/Throat:     Mouth: Mucous membranes are moist.  Eyes:     General: No scleral icterus.    Conjunctiva/sclera: Conjunctivae normal.  Neck:     Musculoskeletal: Normal range of motion. No neck rigidity or muscular tenderness.  Cardiovascular:     Rate and Rhythm: Normal rate and regular rhythm.     Heart sounds: No murmur. No gallop.   Pulmonary:     Effort: Pulmonary effort is normal.  Breath sounds: No stridor. No wheezing, rhonchi or rales.  Abdominal:     General: Abdomen is flat. Bowel sounds are normal. There is no distension.     Palpations: There is no hepatomegaly or splenomegaly.     Tenderness: There is no abdominal tenderness.     Hernia: No hernia is present.  Genitourinary:    Pubic Area: No rash.      Penis: Normal and circumcised. No discharge, swelling or lesions.      Scrotum/Testes: Normal.        Right: Mass, tenderness or swelling not present.        Left: Mass, tenderness or swelling not present.     Epididymis:     Right: Normal. Not inflamed or enlarged.     Left: Normal.  Not inflamed or enlarged.     Prostate: Enlarged (1+ smooth symm BPH). Not tender and no nodules present.     Rectum: Normal. Guaiac result negative. No mass, tenderness, anal fissure, external hemorrhoid or internal hemorrhoid. Normal anal tone.  Musculoskeletal: Normal range of motion.     Right lower leg: No edema.     Left lower leg: No edema.  Lymphadenopathy:     Cervical: No cervical adenopathy.  Skin:    General: Skin is warm and dry.     Coloration: Skin is not pale.  Neurological:     General: No focal deficit present.     Mental Status: He is alert. Mental status is at baseline.     Cranial Nerves: Cranial nerves are intact.     Sensory: Sensation is intact.     Motor: Weakness and atrophy present.     Coordination: Romberg sign positive. Coordination abnormal.     Deep Tendon Reflexes: Reflexes abnormal.     Lab Results  Component Value Date   WBC 5.4 03/27/2019   HGB 15.1 03/27/2019   HCT 45.0 03/27/2019   PLT 190.0 03/27/2019   GLUCOSE 89 03/27/2019   CHOL 128 03/27/2019   TRIG 52.0 03/27/2019   HDL 63.40 03/27/2019   LDLCALC 54 03/27/2019   ALT 29 03/27/2019   AST 37 03/27/2019   NA 140 03/27/2019   K 4.5 03/27/2019   CL 104 03/27/2019   CREATININE 0.71 03/27/2019   BUN 9 03/27/2019   CO2 28 03/27/2019   TSH 4.20 10/18/2017   PSA 1.98 03/27/2019   INR 2.7 03/21/2019   HGBA1C 5.7 (H) 09/05/2014    No results found.  Assessment & Plan:   [name] was seen today for hypertension, hyperlipidemia and atrial fibrillation.  Diagnoses and all orders for this visit:  Need for pneumococcal vaccination -     Pneumococcal polysaccharide vaccine 23-valent greater than or equal to 2yo subcutaneous/IM  Essential hypertension- His blood pressure is not adequately well controlled.  I have asked him to start taking an ARB in addition to the beta-blocker. -     CBC with Differential/Platelet; Future -     Basic metabolic panel; Future -     VITAMIN D 25 Hydroxy  (Vit-D Deficiency, Fractures); Future -     olmesartan (BENICAR) 20 MG tablet; Take 1 tablet (20 mg total) by mouth daily.  Benign prostatic hyperplasia without lower urinary tract symptoms- His PSA is normal which is reassuring that he does not have prostate cancer.  He has no symptoms that need to be treated. -     PSA; Future  Hyperlipidemia with target LDL less than 100- He has achieved his  LDL goal and is doing well on the statin. -     Lipid panel; Future -     Hepatic function panel; Future  Vitamin D deficient osteomalacia- I have asked him to start taking a vitamin D supplement and I will screen him for osteoporosis. -     DG Bone Density; Future -     Cholecalciferol 50 MCG (2000 UT) TABS; Take 1 tablet (2,000 Units total) by mouth daily.  Cerebrovascular accident (CVA) due to embolism of precerebral artery ([place])- Based on his symptoms and exam this is stable.  He will let me know if he changes his mind about additional diagnostic and therapeutic options.   I am having Erin C. Rasch start on olmesartan and Cholecalciferol. I am also having him maintain his acetaminophen, Nitrostat, atorvastatin, warfarin, and metoprolol tartrate. We will stop administering sodium chloride.  Meds ordered this encounter  Medications  . olmesartan (BENICAR) 20 MG tablet    Sig: Take 1 tablet (20 mg total) by mouth daily.    Dispense:  90 tablet    Refill:  1  . Cholecalciferol 50 MCG (2000 UT) TABS    Sig: Take 1 tablet (2,000 Units total) by mouth daily.    Dispense:  90 tablet    Refill:  1     Follow-up: Return in about 3 months (around 06/27/2019).  Scarlette Calico, MD

## 2019-03-28 NOTE — Assessment & Plan Note (Addendum)
He has good rate and rhythm control with the beta-blocker.  Will continue anticoagulation with warfarin.

## 2019-03-29 ENCOUNTER — Telehealth: Payer: Self-pay | Admitting: Internal Medicine

## 2019-03-29 NOTE — Telephone Encounter (Signed)
Spoke with [name] regarding AWV. Patient stated that he is not interested in scheduling AWV at this time. SF

## 2019-04-19 ENCOUNTER — Other Ambulatory Visit: Payer: Self-pay | Admitting: Cardiology

## 2019-04-19 DIAGNOSIS — E785 Hyperlipidemia, unspecified: Secondary | ICD-10-CM

## 2019-05-16 ENCOUNTER — Ambulatory Visit (INDEPENDENT_AMBULATORY_CARE_PROVIDER_SITE_OTHER): Payer: Medicare HMO | Admitting: Pharmacist

## 2019-05-16 ENCOUNTER — Other Ambulatory Visit: Payer: Self-pay

## 2019-05-16 DIAGNOSIS — Z7901 Long term (current) use of anticoagulants: Secondary | ICD-10-CM | POA: Diagnosis not present

## 2019-05-16 DIAGNOSIS — I6310 Cerebral infarction due to embolism of unspecified precerebral artery: Secondary | ICD-10-CM

## 2019-05-16 DIAGNOSIS — I480 Paroxysmal atrial fibrillation: Secondary | ICD-10-CM

## 2019-05-16 LAB — POCT INR: INR: 3.3 — AB (ref 2.0–3.0)

## 2019-06-11 ENCOUNTER — Telehealth: Payer: Self-pay

## 2019-06-11 NOTE — Telephone Encounter (Signed)
Called patient to notify that appointment for tomorrow 09/14 is a virtual visit- LVM advising to call back if questions. Will transition to virtual visit.

## 2019-06-11 NOTE — Progress Notes (Signed)
Virtual Visit via Video Note   This visit type was conducted due to national recommendations for restrictions regarding the COVID-19 Pandemic (e.g. social distancing) in an effort to limit this patient's exposure and mitigate transmission in our community.  Due to his co-morbid illnesses, this patient is at least at moderate risk for complications without adequate follow up.  This format is felt to be most appropriate for this patient at this time.  All issues noted in this document were discussed and addressed.  A limited physical exam was performed with this format.  Please refer to the patient's chart for his consent to telehealth for [facility].   Date:  06/12/2019   ID:  Georgette Dover, DOB [birth year], MRN [id number]  Patient Location:Home Provider Location: Home  PCP:  Janith Lima, MD  Cardiologist:  Dr Stanford Breed  Evaluation Performed:  Follow-Up Visit  Chief Complaint:  FU CAD  History of Present Illness:    FU CAD. Also with h/oHTN, dyslipidemia, remote tobacco abuse, and posterior circulation CVA. Found to have severe three vessel coronary artery disease with preserved LVF at cath 08/29/14. He underwent CABG x 4 on 09/06/14(LIMA to the LAD, saphenous vein graft to the ramus intermedius, saphenous vein graft to the first obtuse marginal and saphenous vein graft to the right coronary artery).Preoperative echocardiogram December 2015 showed normal LV functionand grade 1 diastolic dysfunction. Preoperative carotid Dopplers December 2015 showed 1-39% bilateral stenosis.Also with PAF. Abdominal ultrasound October 2017 showed no aneurysm. Since last seen, the patient denies any dyspnea on exertion, orthopnea, PND, pedal edema, palpitations, syncope or chest pain.   The patient does not have symptoms concerning for COVID-19 infection (fever, chills, cough, or new shortness of breath).    Past Medical History:  Diagnosis Date  . Alcohol abuse, unspecified   . Anginal pain  ([place])   . Arthritis    in back and shoulder  . H/O renal calculi   . History of kidney stones   . Hyperlipidemia    preTx 114, postTx 47 LDL  . Hypertension   . Shortness of breath dyspnea    with exertion  . Stroke [facility])    August '11 x 2: L. cerebellar, lateral medulla, ol rd lacunar infarcts;  2nd left pontine infarct.   Past Surgical History:  Procedure Laterality Date  . BACK SURGERY  1986  . CORONARY ARTERY BYPASS GRAFT N/A 09/06/2014   Procedure: CORONARY ARTERY BYPASS GRAFTING (CABG);  Surgeon: Ivin Poot, MD;  Location: [facility];  Service: Open Heart Surgery;  Laterality: N/A;  CABG times 4, using internal mammary artery, and right leg saphenous vein harvested endoscopically  . LEFT HEART CATHETERIZATION WITH CORONARY ANGIOGRAM N/A 08/29/2014   Procedure: LEFT HEART CATHETERIZATION WITH CORONARY ANGIOGRAM;  Surgeon: Lorretta Harp, MD;  Location: [facility] CATH LAB;  Service: Cardiovascular;  Laterality: N/A;  . LUMBAR DISC SURGERY  1986   Dr. Juanetta Snow  . ROTATOR CUFF REPAIR  06/10/12   at [place]...left shoulder  . TEE WITHOUT CARDIOVERSION N/A 09/06/2014   Procedure: TRANSESOPHAGEAL ECHOCARDIOGRAM (TEE);  Surgeon: Ivin Poot, MD;  Location: [facility];  Service: Open Heart Surgery;  Laterality: N/A;  . TONSILLECTOMY  1954  . TONSILLECTOMY       Current Meds  Medication Sig  . acetaminophen (TYLENOL) 325 MG tablet Take 2 tablets (650 mg total) by mouth every 4 (four) hours as needed for headache or mild pain.  Marland Kitchen atorvastatin (LIPITOR) 20 MG tablet TAKE 1  TABLET BY MOUTH EVERY DAY  . Cholecalciferol 50 MCG (2000 UT) TABS Take 1 tablet (2,000 Units total) by mouth daily.  . metoprolol tartrate (LOPRESSOR) 25 MG tablet TAKE 1.5 TABLETS BY MOUTH TWICE DAILY  . NITROSTAT 0.4 MG SL tablet Place 1 tablet under the tongue as needed for chest pain.   Marland Kitchen olmesartan (BENICAR) 20 MG tablet Take 1 tablet (20 mg total) by mouth daily.  Marland Kitchen warfarin (COUMADIN) 5 MG tablet  TAKE 1 TO 1 AND 1/2 TABLETS BY MOUTH DAILY AS DIRECTED BY COUMADIN CLINIC     Allergies:   Patient has no known allergies.   Social History   Tobacco Use  . Smoking status: Former Smoker    Packs/day: 1.00    Years: 15.00    Pack years: 15.00    Types: Cigarettes    Quit date: 09/27/1978    Years since quitting: 40.7  . Smokeless tobacco: Never Used  Substance Use Topics  . Alcohol use: Yes    Alcohol/week: 6.0 standard drinks    Types: 6 Cans of beer per week    Comment: stopped drinking after stroke. Had been drink 6 pk per day plus binge drinking  . Drug use: No     Family Hx: The patient's family history includes GI problems in his mother; Heart attack in his father; Heart disease in his father; Hypertension in his father. There is no history of Cancer, Diabetes, Early death, Hearing loss, Hyperlipidemia, Kidney disease, Stroke, or Alcohol abuse.  ROS:   Please see the history of present illness.    No Fever, chills  or productive cough; balance problems from prior CVA All other systems reviewed and are negative.   Recent Labs: 03/27/2019: ALT 29; BUN 9; Creatinine, Ser 0.71; Hemoglobin 15.1; Platelets 190.0; Potassium 4.5; Sodium 140   Recent Lipid Panel Lab Results  Component Value Date/Time   CHOL 128 03/27/2019 10:43 AM   TRIG 52.0 03/27/2019 10:43 AM   HDL 63.40 03/27/2019 10:43 AM   CHOLHDL 2 03/27/2019 10:43 AM   LDLCALC 54 03/27/2019 10:43 AM    Wt Readings from Last 3 Encounters:  06/12/19 182 lb (82.6 kg)  03/27/19 180 lb 12 oz (82 kg)  05/08/18 179 lb (81.2 kg)     Objective:    Vital Signs:  Ht 6\' 1"  (1.854 m)   Wt 182 lb (82.6 kg)   BMI 24.01 kg/m    VITAL SIGNS:  reviewed NAD Answers questions appropriately Normal affect Remainder of physical examination not performed (telehealth visit; coronavirus pandemic)  ASSESSMENT & PLAN:    1. Coronary artery disease status post coronary artery bypass graft-patient denies any recurrent chest  pain.  Plan to continue medical therapy with statin.  He is not on aspirin given need for anticoagulation. 2. Paroxysmal atrial fibrillation-patient on history he remains in sinus rhythm.  Continue beta-blocker for rate control if atrial fibrillation recurs.  Continue Coumadin.  He changed from apixaban previously due to expense. 3. Hypertension-Continue present medical regimen and follow. 4. Hyperlipidemia-continue statin. 5. Prior cryptogenic stroke.  COVID-19 Education: The importance of social distancing was discussed today.  Time:   Today, I have spent 18 minutes with the patient with telehealth technology discussing the above problems.     Medication Adjustments/Labs and Tests Ordered: Current medicines are reviewed at length with the patient today.  Concerns regarding medicines are outlined above.   Tests Ordered: No orders of the defined types were placed in this encounter.   Medication  Changes: No orders of the defined types were placed in this encounter.   Follow Up:  Virtual Visit or In Person in 1 year(s)  Signed, Kirk Ruths, MD  06/12/2019 9:11 AM    [place]

## 2019-06-12 ENCOUNTER — Encounter: Payer: Self-pay | Admitting: Cardiology

## 2019-06-12 ENCOUNTER — Encounter

## 2019-06-12 ENCOUNTER — Telehealth (INDEPENDENT_AMBULATORY_CARE_PROVIDER_SITE_OTHER): Payer: Medicare HMO | Admitting: Cardiology

## 2019-06-12 VITALS — Ht 73.0 in | Wt 182.0 lb

## 2019-06-12 DIAGNOSIS — I480 Paroxysmal atrial fibrillation: Secondary | ICD-10-CM | POA: Diagnosis not present

## 2019-06-12 DIAGNOSIS — I10 Essential (primary) hypertension: Secondary | ICD-10-CM | POA: Diagnosis not present

## 2019-06-12 DIAGNOSIS — E7800 Pure hypercholesterolemia, unspecified: Secondary | ICD-10-CM | POA: Diagnosis not present

## 2019-06-12 DIAGNOSIS — I2510 Atherosclerotic heart disease of native coronary artery without angina pectoris: Secondary | ICD-10-CM | POA: Diagnosis not present

## 2019-06-12 NOTE — Patient Instructions (Signed)
Medication Instructions:  The current medical regimen is effective;  continue present plan and medications.  If you need a refill on your cardiac medications before your next appointment, please call your pharmacy.   Follow-Up: At [facility], you and your health needs are our priority.  As part of our continuing mission to provide you with exceptional heart care, we have created designated Provider Care Teams.  These Care Teams include your primary Cardiologist (physician) and Advanced Practice Providers (APPs -  Physician Assistants and Nurse Practitioners) who all work together to provide you with the care you need, when you need it. You will need a follow up appointment in 12 months.  Please call our office 2 months in advance to schedule this appointment.  You may see Dr.Crenshaw or one of the following Advanced Practice Providers on your designated Care Team: Almyra Deforest, [place] . Fabian Sharp, PA-C

## 2019-06-14 ENCOUNTER — Other Ambulatory Visit: Payer: Self-pay

## 2019-06-14 ENCOUNTER — Ambulatory Visit (INDEPENDENT_AMBULATORY_CARE_PROVIDER_SITE_OTHER): Payer: Medicare HMO | Admitting: Pharmacist Clinician (PhC)/ Clinical Pharmacy Specialist

## 2019-06-14 DIAGNOSIS — I480 Paroxysmal atrial fibrillation: Secondary | ICD-10-CM

## 2019-06-14 DIAGNOSIS — Z7901 Long term (current) use of anticoagulants: Secondary | ICD-10-CM | POA: Diagnosis not present

## 2019-06-14 DIAGNOSIS — I6310 Cerebral infarction due to embolism of unspecified precerebral artery: Secondary | ICD-10-CM

## 2019-06-14 LAB — POCT INR: INR: 3.1 — AB (ref 2.0–3.0)

## 2019-06-14 NOTE — Patient Instructions (Signed)
HOW TO TAKE YOUR BLOOD PRESSURE: . Rest 5 minutes before taking your blood pressure. .  Don't smoke or drink caffeinated beverages for at least 30 minutes before. . Take your blood pressure before (not after) you eat. . Sit comfortably with your back supported and both feet on the floor (don't cross your legs). . Elevate your arm to heart level on a table or a desk. . Use the proper sized cuff. It should fit smoothly and snugly around your bare upper arm. There should be enough room to slip a fingertip under the cuff. The bottom edge of the cuff should be 1 inch above the crease of the elbow. . Ideally, take 3 measurements at one sitting and record the average.

## 2019-06-22 DIAGNOSIS — H5203 Hypermetropia, bilateral: Secondary | ICD-10-CM | POA: Diagnosis not present

## 2019-06-23 ENCOUNTER — Other Ambulatory Visit: Payer: Self-pay | Admitting: Cardiology

## 2019-06-29 ENCOUNTER — Other Ambulatory Visit: Payer: Self-pay | Admitting: Internal Medicine

## 2019-06-29 DIAGNOSIS — I480 Paroxysmal atrial fibrillation: Secondary | ICD-10-CM

## 2019-06-29 DIAGNOSIS — I10 Essential (primary) hypertension: Secondary | ICD-10-CM

## 2019-06-29 NOTE — Telephone Encounter (Signed)
LVM for pt to call back for an appt.

## 2019-07-05 ENCOUNTER — Other Ambulatory Visit: Payer: Self-pay | Admitting: Cardiology

## 2019-07-05 DIAGNOSIS — I480 Paroxysmal atrial fibrillation: Secondary | ICD-10-CM

## 2019-07-05 DIAGNOSIS — I10 Essential (primary) hypertension: Secondary | ICD-10-CM

## 2019-07-05 NOTE — Telephone Encounter (Signed)
Please advise if ok to refill Metoprolol Tartrate 25 mg 1.5 mg tablet bid. Medication last filled by PCP.

## 2019-08-01 ENCOUNTER — Other Ambulatory Visit: Payer: Self-pay

## 2019-08-01 ENCOUNTER — Ambulatory Visit (INDEPENDENT_AMBULATORY_CARE_PROVIDER_SITE_OTHER): Payer: Medicare HMO | Admitting: Pharmacist

## 2019-08-01 DIAGNOSIS — Z7901 Long term (current) use of anticoagulants: Secondary | ICD-10-CM | POA: Diagnosis not present

## 2019-08-01 DIAGNOSIS — I480 Paroxysmal atrial fibrillation: Secondary | ICD-10-CM

## 2019-08-01 DIAGNOSIS — I6310 Cerebral infarction due to embolism of unspecified precerebral artery: Secondary | ICD-10-CM | POA: Diagnosis not present

## 2019-08-01 LAB — POCT INR: INR: 2.6 (ref 2.0–3.0)

## 2019-08-01 NOTE — Patient Instructions (Signed)
Continue taking  1 tablet daily except 1.5 tablets each Wednesday.  Repeat INR in 6 weeks.  *Call billing department at [phone] Option 7*

## 2019-09-13 ENCOUNTER — Telehealth: Payer: Self-pay

## 2019-09-13 DIAGNOSIS — Z20828 Contact with and (suspected) exposure to other viral communicable diseases: Secondary | ICD-10-CM | POA: Diagnosis not present

## 2019-09-13 NOTE — Telephone Encounter (Signed)
lmom for overdue inr 

## 2019-09-19 ENCOUNTER — Other Ambulatory Visit: Payer: Self-pay

## 2019-09-19 ENCOUNTER — Ambulatory Visit (INDEPENDENT_AMBULATORY_CARE_PROVIDER_SITE_OTHER): Payer: Medicare HMO | Admitting: Pharmacist Clinician (PhC)/ Clinical Pharmacy Specialist

## 2019-09-19 DIAGNOSIS — I480 Paroxysmal atrial fibrillation: Secondary | ICD-10-CM

## 2019-09-19 DIAGNOSIS — Z7901 Long term (current) use of anticoagulants: Secondary | ICD-10-CM | POA: Diagnosis not present

## 2019-09-19 DIAGNOSIS — I6310 Cerebral infarction due to embolism of unspecified precerebral artery: Secondary | ICD-10-CM

## 2019-09-19 LAB — POCT INR: INR: 3 (ref 2.0–3.0)

## 2019-09-30 ENCOUNTER — Other Ambulatory Visit: Payer: Self-pay | Admitting: Cardiology

## 2019-09-30 DIAGNOSIS — E785 Hyperlipidemia, unspecified: Secondary | ICD-10-CM

## 2019-10-19 ENCOUNTER — Ambulatory Visit: Payer: Medicare HMO | Attending: Internal Medicine

## 2019-10-19 DIAGNOSIS — Z23 Encounter for immunization: Secondary | ICD-10-CM

## 2019-10-19 NOTE — Progress Notes (Signed)
   Covid-19 Vaccination Clinic  Name:  FILBERTO FLUCKIGER    MRN: [id number] DOB: 12/29/1945  10/19/2019  Mr. Gartman was observed post Covid-19 immunization for 15 minutes without incidence. He was provided with Vaccine Information Sheet and instruction to access the V-Safe system.   Mr. Ulland was instructed to call 911 with any severe reactions post vaccine: Marland Kitchen Difficulty breathing  . Swelling of your face and throat  . A fast heartbeat  . A bad rash all over your body  . Dizziness and weakness    Immunizations Administered    Name Date Dose VIS Date Route   Pfizer COVID-19 Vaccine 10/19/2019 10:38 AM 0.3 mL 09/07/2019 Intramuscular   Manufacturer: [place]   Lot: [id number]   [place]: [id number]

## 2019-10-31 ENCOUNTER — Other Ambulatory Visit: Payer: Self-pay | Admitting: Cardiology

## 2019-10-31 ENCOUNTER — Ambulatory Visit (INDEPENDENT_AMBULATORY_CARE_PROVIDER_SITE_OTHER): Payer: Medicare HMO | Admitting: Pharmacist Clinician (PhC)/ Clinical Pharmacy Specialist

## 2019-10-31 ENCOUNTER — Other Ambulatory Visit: Payer: Self-pay

## 2019-10-31 DIAGNOSIS — Z7901 Long term (current) use of anticoagulants: Secondary | ICD-10-CM | POA: Diagnosis not present

## 2019-10-31 DIAGNOSIS — I480 Paroxysmal atrial fibrillation: Secondary | ICD-10-CM | POA: Diagnosis not present

## 2019-10-31 DIAGNOSIS — I6310 Cerebral infarction due to embolism of unspecified precerebral artery: Secondary | ICD-10-CM | POA: Diagnosis not present

## 2019-10-31 LAB — POCT INR: INR: 2.4 (ref 2.0–3.0)

## 2019-11-05 ENCOUNTER — Ambulatory Visit: Payer: Medicare HMO

## 2019-11-10 ENCOUNTER — Ambulatory Visit: Payer: Medicare HMO | Attending: Internal Medicine

## 2019-11-10 DIAGNOSIS — Z23 Encounter for immunization: Secondary | ICD-10-CM | POA: Insufficient documentation

## 2019-11-10 NOTE — Progress Notes (Signed)
   Covid-19 Vaccination Clinic  Name:  [name]    MRN: [id number] DOB: 23-Dec-1945  11/10/2019  [name] was observed post Covid-19 immunization for 15 minutes without incidence. He was provided with Vaccine Information Sheet and instruction to access the V-Safe system.   [name] was instructed to call 911 with any severe reactions post vaccine: Marland Kitchen Difficulty breathing  . Swelling of your face and throat  . A fast heartbeat  . A bad rash all over your body  . Dizziness and weakness    Immunizations Administered    Name Date Dose VIS Date Route   Pfizer COVID-19 Vaccine 11/10/2019 10:12 AM 0.3 mL 09/07/2019 Intramuscular   Manufacturer: [place]   Lot: [id number]   [place]: [id number]

## 2019-12-12 ENCOUNTER — Other Ambulatory Visit: Payer: Self-pay

## 2019-12-12 ENCOUNTER — Ambulatory Visit (INDEPENDENT_AMBULATORY_CARE_PROVIDER_SITE_OTHER): Payer: Medicare HMO | Admitting: Pharmacist Clinician (PhC)/ Clinical Pharmacy Specialist

## 2019-12-12 DIAGNOSIS — I480 Paroxysmal atrial fibrillation: Secondary | ICD-10-CM

## 2019-12-12 DIAGNOSIS — Z7901 Long term (current) use of anticoagulants: Secondary | ICD-10-CM | POA: Diagnosis not present

## 2019-12-12 DIAGNOSIS — I6310 Cerebral infarction due to embolism of unspecified precerebral artery: Secondary | ICD-10-CM

## 2019-12-12 LAB — POCT INR: INR: 3.9 — AB (ref 2.0–3.0)

## 2020-01-02 ENCOUNTER — Other Ambulatory Visit: Payer: Self-pay

## 2020-01-02 ENCOUNTER — Ambulatory Visit (INDEPENDENT_AMBULATORY_CARE_PROVIDER_SITE_OTHER): Payer: Medicare HMO | Admitting: Pharmacist Clinician (PhC)/ Clinical Pharmacy Specialist

## 2020-01-02 DIAGNOSIS — Z7901 Long term (current) use of anticoagulants: Secondary | ICD-10-CM | POA: Diagnosis not present

## 2020-01-02 DIAGNOSIS — I6310 Cerebral infarction due to embolism of unspecified precerebral artery: Secondary | ICD-10-CM | POA: Diagnosis not present

## 2020-01-02 DIAGNOSIS — I480 Paroxysmal atrial fibrillation: Secondary | ICD-10-CM | POA: Diagnosis not present

## 2020-01-02 LAB — POCT INR: INR: 2.2 (ref 2.0–3.0)

## 2020-01-14 ENCOUNTER — Encounter: Payer: Self-pay | Admitting: Family Medicine

## 2020-01-14 ENCOUNTER — Other Ambulatory Visit: Payer: Self-pay

## 2020-01-14 ENCOUNTER — Ambulatory Visit: Payer: Medicare HMO | Admitting: Family Medicine

## 2020-01-14 DIAGNOSIS — M21371 Foot drop, right foot: Secondary | ICD-10-CM

## 2020-01-14 MED ORDER — AMBULATORY NON FORMULARY MEDICATION: 1.0000 [IU] | Freq: Once | 0 refills | Status: AC

## 2020-01-14 NOTE — Progress Notes (Signed)
[place] Phone: [phone] Subjective:   [name], am serving as a scribe for Dr. Hulan Saas. This visit occurred during the SARS-CoV-2 public health emergency.  Safety protocols were in place, including screening questions prior to the visit, additional usage of staff PPE, and extensive cleaning of exam room while observing appropriate contact time as indicated for disinfecting solutions.   I'm seeing this patient by the request  of:  Eric Lima, MD  CC: Foot drop, leg weakness  [id number]  [name] is a 74 y.o. male coming in with complaint of right sided weakness due to stroke. Had to strokes at 74 years old. States that his gait has gotten worse over the years. Likes to walk his dogs and would like to improve balance. Has tried physical therapy which did help his speech and writing. Felt that physical therapy he tried for his balance and strength was not as fruitful. History of quadruple bipass.  Patient continues to have pain fairly regular basis at the moment.  Patient is concerned that he is going to fall secondary to the weakness in the leg.      Past Medical History:  Diagnosis Date  . Alcohol abuse, unspecified   . Anginal pain ([place])   . Arthritis    in back and shoulder  . H/O renal calculi   . History of kidney stones   . Hyperlipidemia    preTx 114, postTx 47 LDL  . Hypertension   . Shortness of breath dyspnea    with exertion  . Stroke [facility])    August '11 x 2: L. cerebellar, lateral medulla, ol rd lacunar infarcts;  2nd left pontine infarct.   Past Surgical History:  Procedure Laterality Date  . BACK SURGERY  1986  . CORONARY ARTERY BYPASS GRAFT N/A 09/06/2014   Procedure: CORONARY ARTERY BYPASS GRAFTING (CABG);  Surgeon: Ivin Poot, MD;  Location: [place];  Service: Open Heart Surgery;  Laterality: N/A;  CABG times 4, using internal mammary artery, and right leg  saphenous vein harvested endoscopically  . LEFT HEART CATHETERIZATION WITH CORONARY ANGIOGRAM N/A 08/29/2014   Procedure: LEFT HEART CATHETERIZATION WITH CORONARY ANGIOGRAM;  Surgeon: Lorretta Harp, MD;  Location: [facility] CATH LAB;  Service: Cardiovascular;  Laterality: N/A;  . LUMBAR DISC SURGERY  1986   Dr. Juanetta Snow  . ROTATOR CUFF REPAIR  06/10/12   at [place]...left shoulder  . TEE WITHOUT CARDIOVERSION N/A 09/06/2014   Procedure: TRANSESOPHAGEAL ECHOCARDIOGRAM (TEE);  Surgeon: Ivin Poot, MD;  Location: [place];  Service: Open Heart Surgery;  Laterality: N/A;  . TONSILLECTOMY  1954  . TONSILLECTOMY     Social History   Socioeconomic History  . Marital status: Married    Spouse name: Not on file  . Number of children: 2  . Years of education: 51  . Highest education level: Not on file  Occupational History  . Occupation: Administrator, arts - paint  Tobacco Use  . Smoking status: Former Smoker    Packs/day: 1.00    Years: 15.00    Pack years: 15.00    Types: Cigarettes    Quit date: 09/27/1978    Years since quitting: 41.3  . Smokeless tobacco: Never Used  Substance and Sexual Activity  . Alcohol use: Yes    Alcohol/week: 6.0 standard drinks    Types: 6 Cans of beer per week    Comment: stopped drinking  after stroke. Had been drink 6 pk per day plus binge drinking  . Drug use: [name]  . Sexual activity: Yes    Partners: Female  Other Topics Concern  . Not on file  Social History Narrative   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 Married '79. 1 dtr- '68, 1 step son. 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 Marriage in good health. He reports he is very strong in his faith.    Social Determinants of Health   Financial Resource Strain:   . Difficulty of Paying Living Expenses:   Food Insecurity:   . Worried About Charity fundraiser in the Last Year:   . Arboriculturist in the Last Year:     Transportation Needs:   . Film/video editor (Medical):   Marland Kitchen Lack of Transportation (Non-Medical):   Physical Activity:   . Days of Exercise per Week:   . Minutes of Exercise per Session:   Stress:   . Feeling of Stress :   Social Connections:   . Frequency of Communication with Friends and Family:   . Frequency of Social Gatherings with Friends and Family:   . Attends Religious Services:   . Active Member of Clubs or Organizations:   . Attends Archivist Meetings:   Marland Kitchen Marital Status:    [name] Known Allergies Family History  Problem Relation Age of Onset  . Heart disease Father   . Hypertension Father   . Heart attack Father   . GI problems Mother   . Cancer Neg Hx   . Diabetes Neg Hx   . Early death Neg Hx   . Hearing loss Neg Hx   . Hyperlipidemia Neg Hx   . Kidney disease Neg Hx   . Stroke Neg Hx   . Alcohol abuse Neg Hx      Current Outpatient Medications (Cardiovascular):  .  atorvastatin (LIPITOR) 20 MG tablet, Take 1 tablet (20 mg total) by mouth daily. NEEDS OV FOR REFILL .  metoprolol tartrate (LOPRESSOR) 25 MG tablet, TAKE 1 AND 1/2 TABLETS BY MOUTH TWICE DAILY .  NITROSTAT 0.4 MG SL tablet, Place 1 tablet under the tongue as needed for chest pain.  Marland Kitchen  olmesartan (BENICAR) 20 MG tablet, Take 1 tablet (20 mg total) by mouth daily.   Current Outpatient Medications (Analgesics):  .  acetaminophen (TYLENOL) 325 MG tablet, Take 2 tablets (650 mg total) by mouth every 4 (four) hours as needed for headache or mild pain.  Current Outpatient Medications (Hematological):  .  warfarin (COUMADIN) 5 MG tablet, TAKE 1 TO 1 AND 1/2 TABLETS BY MOUTH DAILY AS DIRECTED BY COUMADIN CLINIC  Current Outpatient Medications (Other):  Marland Kitchen  AMBULATORY NON FORMULARY MEDICATION, 1 Units by Other route once for 1 dose. .  Cholecalciferol 50 MCG (2000 UT) TABS, Take 1 tablet (2,000 Units total) by mouth daily.   Reviewed prior external information including notes and  imaging from  primary care provider As well as notes that were available from care everywhere and other healthcare systems.  Past medical history, social, surgical and family history all reviewed in electronic medical record.  [name] pertanent information unless stated regarding to the chief complaint.   Review of Systems:  [name] headache, visual changes, nausea, vomiting, diarrhea, constipation, dizziness, abdominal pain, skin rash, fevers, chills, night sweats, weight loss, swollen lymph nodes, , joint swelling, chest  pain, shortness of breath, mood changes. POSITIVE muscle aches, body aches  Objective  Blood pressure 132/90, pulse 63, height 6\' 1"  (1.854 m), weight 189 lb (85.7 kg), SpO2 98 %.   General: [name] apparent distress alert and oriented x3 mood and affect normal, dressed appropriately.  HEENT: Pupils equal, extraocular movements intact  Respiratory: Patient's speak in full sentences and does not appear short of breath  Cardiovascular: [name] lower extremity edema, non tender, [name] erythema  Neuro: Cranial nerves II through XII are intact, neurovascularly intact in all extremities with 2+ DTRs and 2+ pulses.  Gait patient does have hyperextension noted of the right knee significantly.  Foot drop noted on the right side as well. MSK: Patient's leg wound patient does have significant atrophy of the right side thigh and calf musculature compared to the contralateral side.  Patient has 4-5 plantarflexion strength of the right leg but only 2+ out of 5 with dorsiflexion of the foot.  Patient does have 4-5 strength of the knee extension and 4-5 strength of knee flexion compared to the contralateral side.  Does seem to be somewhat neurovascularly intact.    Impression and Recommendations:     This case required medical decision making of moderate complexity. The above documentation has been reviewed and is accurate and complete Lyndal Pulley, DO       Note: This dictation was prepared with Dragon  dictation along with smaller phrase technology. Any transcriptional errors that result from this process are unintentional.

## 2020-01-14 NOTE — Assessment & Plan Note (Signed)
Patient has significant foot drop that he believes is causing more of the gait disturbance.  Discussed with patient the right great length that I do feel that the hyperextension of the knee is also a consideration.  Patient will be sent to the [facility] to discuss an AFO brace and see if this would be beneficial because patient will be likely more compliant wearing this then a knee brace.  We did attempt a playmaker knee brace which patient did not feel was comfortable and possibly an OA stability brace will be necessary.  We did try a heel lift today in the shoe.  Home exercises given to help with some of the strength.  Discussed diet changes that I think will be beneficial as well.  Follow-up with me again in 6 weeks

## 2020-01-14 NOTE — Patient Instructions (Signed)
Good to see you  Heel lift in the toe of your shoe backwards to help bring up the toe.  Exercises 3 times a week.  Essential amino acids daily at least  We will try to get a knee brace Stay active We may need to consider PT and AFO brace in future but if we can stabilize the knee the better  See me again in 6 weeks

## 2020-02-12 DIAGNOSIS — M21371 Foot drop, right foot: Secondary | ICD-10-CM | POA: Diagnosis not present

## 2020-02-13 ENCOUNTER — Other Ambulatory Visit: Payer: Self-pay

## 2020-02-13 ENCOUNTER — Ambulatory Visit (INDEPENDENT_AMBULATORY_CARE_PROVIDER_SITE_OTHER): Payer: Medicare HMO | Admitting: Pharmacist Clinician (PhC)/ Clinical Pharmacy Specialist

## 2020-02-13 DIAGNOSIS — I480 Paroxysmal atrial fibrillation: Secondary | ICD-10-CM

## 2020-02-13 DIAGNOSIS — Z7901 Long term (current) use of anticoagulants: Secondary | ICD-10-CM

## 2020-02-13 DIAGNOSIS — I6310 Cerebral infarction due to embolism of unspecified precerebral artery: Secondary | ICD-10-CM

## 2020-02-13 LAB — POCT INR: INR: 2.4 (ref 2.0–3.0)

## 2020-02-26 ENCOUNTER — Ambulatory Visit (INDEPENDENT_AMBULATORY_CARE_PROVIDER_SITE_OTHER): Payer: Medicare HMO | Admitting: Family Medicine

## 2020-02-26 ENCOUNTER — Encounter: Payer: Self-pay | Admitting: Family Medicine

## 2020-02-26 ENCOUNTER — Other Ambulatory Visit: Payer: Self-pay

## 2020-02-26 DIAGNOSIS — R2681 Unsteadiness on feet: Secondary | ICD-10-CM

## 2020-02-26 DIAGNOSIS — M21371 Foot drop, right foot: Secondary | ICD-10-CM | POA: Diagnosis not present

## 2020-02-26 NOTE — Assessment & Plan Note (Signed)
We will consider doing some potential orhtglass splinting.  We will try to get the right information and right material in.  We will try to have make some type of splint that patient can wear with the AFO to help decrease the instability and the hyperextension of the knee.  Patient was adamant he did not want one of the stability braces that we have discussed previously at this time.  Patient will try this conservative therapy and will follow up with me again in 4 to 6 weeks

## 2020-02-26 NOTE — Patient Instructions (Signed)
Good to see you We will call you

## 2020-02-26 NOTE — Assessment & Plan Note (Signed)
I believe that the foot drop seems to be improved with the AFO.  Patient's no change at this time.  Does have weakness of the lower extremity that I think is contributing and does contribute to the hyperextension of the knee.

## 2020-02-26 NOTE — Progress Notes (Signed)
[place] [address] [place] Phone: [phone] Subjective:   I [name] am serving as a Education administrator for Dr. Hulan Saas.  This visit occurred during the SARS-CoV-2 public health emergency.  Safety protocols were in place, including screening questions prior to the visit, additional usage of staff PPE, and extensive cleaning of exam room while observing appropriate contact time as indicated for disinfecting solutions.   I'm seeing this patient by the request  of:  Janith Lima, MD  CC:   [id number]   01/14/2020 Patient has significant foot drop that he believes is causing more of the gait disturbance.  Discussed with patient the right great length that I do feel that the hyperextension of the knee is also a consideration.  Patient will be sent to the [facility] clinic to discuss an AFO brace and see if this would be beneficial because patient will be likely more compliant wearing this then a knee brace.  We did attempt a playmaker knee brace which patient did not feel was comfortable and possibly an OA stability brace will be necessary.  We did try a heel lift today in the shoe.  Home exercises given to help with some of the strength.  Discussed diet changes that I think will be beneficial as well.  Follow-up with me again in 6 weeks  Update 02/26/2020 [name] is a 74 y.o. male coming in with complaint of right foot drop. Patient states the brace is not doing what he thought it would do. Believes he may need some adjustments from the [facility]. Has purchased new shoes. Also using the lifts in his shoes.  Patient feels like it has not helped the walking as much as he would like.  Patient still feels that the knee is unstable and continues to get pain as well.       Past Medical History:  Diagnosis Date  . Alcohol abuse, unspecified   . Anginal pain ([place])   . Arthritis    in back and shoulder  . H/O renal calculi   .  History of kidney stones   . Hyperlipidemia    preTx 114, postTx 47 LDL  . Hypertension   . Shortness of breath dyspnea    with exertion  . Stroke [facility])    August '11 x 2: L. cerebellar, lateral medulla, ol rd lacunar infarcts;  2nd left pontine infarct.   Past Surgical History:  Procedure Laterality Date  . BACK SURGERY  1986  . CORONARY ARTERY BYPASS GRAFT N/A 09/06/2014   Procedure: CORONARY ARTERY BYPASS GRAFTING (CABG);  Surgeon: Ivin Poot, MD;  Location: [place];  Service: Open Heart Surgery;  Laterality: N/A;  CABG times 4, using internal mammary artery, and right leg saphenous vein harvested endoscopically  . LEFT HEART CATHETERIZATION WITH CORONARY ANGIOGRAM N/A 08/29/2014   Procedure: LEFT HEART CATHETERIZATION WITH CORONARY ANGIOGRAM;  Surgeon: Lorretta Harp, MD;  Location: [facility] CATH LAB;  Service: Cardiovascular;  Laterality: N/A;  . LUMBAR DISC SURGERY  1986   Dr. Juanetta Snow  . ROTATOR CUFF REPAIR  06/10/12   at [place]...left shoulder  . TEE WITHOUT CARDIOVERSION N/A 09/06/2014   Procedure: TRANSESOPHAGEAL ECHOCARDIOGRAM (TEE);  Surgeon: Ivin Poot, MD;  Location: [place];  Service: Open Heart Surgery;  Laterality: N/A;  . TONSILLECTOMY  1954  . TONSILLECTOMY     Social History   Socioeconomic History  . Marital status: Married  Spouse name: Not on file  . Number of children: 2  . Years of education: 3  . Highest education level: Not on file  Occupational History  . Occupation: Administrator, arts - paint  Tobacco Use  . Smoking status: Former Smoker    Packs/day: 1.00    Years: 15.00    Pack years: 15.00    Types: Cigarettes    Quit date: 09/27/1978    Years since quitting: 41.4  . Smokeless tobacco: Never Used  Substance and Sexual Activity  . Alcohol use: Yes    Alcohol/week: 6.0 standard drinks    Types: 6 Cans of beer per week    Comment: stopped drinking after stroke. Had been drink 6 pk per day plus binge drinking  . Drug use: No    . Sexual activity: Yes    Partners: Female  Other Topics Concern  . Not on file  Social History Narrative   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 Married '79. 1 dtr- '68, 1 step son. 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 Marriage in good health. He reports he is very strong in his faith.    Social Determinants of Health   Financial Resource Strain:   . Difficulty of Paying Living Expenses:   Food Insecurity:   . Worried About Charity fundraiser in the Last Year:   . Arboriculturist in the Last Year:   Transportation Needs:   . Film/video editor (Medical):   Marland Kitchen Lack of Transportation (Non-Medical):   Physical Activity:   . Days of Exercise per Week:   . Minutes of Exercise per Session:   Stress:   . Feeling of Stress :   Social Connections:   . Frequency of Communication with Friends and Family:   . Frequency of Social Gatherings with Friends and Family:   . Attends Religious Services:   . Active Member of Clubs or Organizations:   . Attends Archivist Meetings:   Marland Kitchen Marital Status:    No Known Allergies Family History  Problem Relation Age of Onset  . Heart disease Father   . Hypertension Father   . Heart attack Father   . GI problems Mother   . Cancer Neg Hx   . Diabetes Neg Hx   . Early death Neg Hx   . Hearing loss Neg Hx   . Hyperlipidemia Neg Hx   . Kidney disease Neg Hx   . Stroke Neg Hx   . Alcohol abuse Neg Hx      Current Outpatient Medications (Cardiovascular):  .  atorvastatin (LIPITOR) 20 MG tablet, Take 1 tablet (20 mg total) by mouth daily. NEEDS OV FOR REFILL .  metoprolol tartrate (LOPRESSOR) 25 MG tablet, TAKE 1 AND 1/2 TABLETS BY MOUTH TWICE DAILY .  NITROSTAT 0.4 MG SL tablet, Place 1 tablet under the tongue as needed for chest pain.  Marland Kitchen  olmesartan (BENICAR) 20 MG tablet, Take 1 tablet (20 mg total) by mouth daily.   Current Outpatient Medications (Analgesics):   .  acetaminophen (TYLENOL) 325 MG tablet, Take 2 tablets (650 mg total) by mouth every 4 (four) hours as needed for headache or mild pain.  Current Outpatient Medications (Hematological):  .  warfarin (COUMADIN) 5 MG tablet, TAKE 1 TO 1 AND 1/2 TABLETS BY MOUTH DAILY AS DIRECTED BY COUMADIN CLINIC  Current Outpatient Medications (Other):  .  Cholecalciferol 50 MCG (2000 UT) TABS, Take 1 tablet (2,000 Units total) by mouth daily.   Reviewed prior external information including notes and imaging from  primary care provider As well as notes that were available from care everywhere and other healthcare systems.  Past medical history, social, surgical and family history all reviewed in electronic medical record.  No pertanent information unless stated regarding to the chief complaint.   Review of Systems:  No headache, visual changes, nausea, vomiting, diarrhea, constipation, dizziness, abdominal pain, skin rash, fevers, chills, night sweats, weight loss, swollen lymph nodes, body aches,, chest pain, shortness of breath, mood changes. POSITIVE muscle aches, joint swelling  Objective  Blood pressure (!) 156/90, pulse (!) 55, height 6\' 1"  (1.854 m), weight 184 lb (83.5 kg), SpO2 97 %.   General: No apparent distress alert and oriented x3 mood and affect normal, dressed appropriately.  HEENT: Pupils equal, extraocular movements intact  Respiratory: Patient's speak in full sentences and does not appear short of breath  Cardiovascular: No lower extremity edema, non tender, no erythema  Neuro: Cranial nerves II through XII are intact, neurovascularly intact in all extremities with 2+ DTRs and 2+ pulses.  Gait mild antalgic MSK: Patient continues to favor the right leg.  Hyperextends the knee with every step.  The patient's foot drop is significantly better in the AFO.  Patient is minorly tender to palpation over the right knee.  Patient does not have significant instability with valgus or varus  force     Impression and Recommendations:     The above documentation has been reviewed and is accurate and complete Lyndal Pulley, DO       Note: This dictation was prepared with Dragon dictation along with smaller phrase technology. Any transcriptional errors that result from this process are unintentional.

## 2020-03-11 ENCOUNTER — Other Ambulatory Visit: Payer: Self-pay | Admitting: Cardiology

## 2020-03-12 ENCOUNTER — Telehealth: Payer: Self-pay

## 2020-03-12 NOTE — Telephone Encounter (Signed)
Called patient to schedule time to fix foot drop brace

## 2020-03-21 ENCOUNTER — Telehealth: Payer: Self-pay

## 2020-03-21 NOTE — Telephone Encounter (Signed)
Called patient a second time phone rang once and went straight to voicemail. Left a VM.

## 2020-03-21 NOTE — Telephone Encounter (Signed)
-----   Message from Lyndal Pulley, DO sent at 03/15/2020  1:14 PM EDT ----- Regarding: RE: Foot Drop We can try PT but might not be a lot of help. The playmaker brace is the only thing that would work and he did not like it previously I believe  ----- Message ----- From: Kandace Blitz Sent: 03/14/2020   9:49 AM EDT To: Lyndal Pulley, DO Subject: Foot Drop                                      Good morning Dr. Tamala Julian,  Just wanted to let you know we fitted [name] today with the splinting material and it did not help with his hyper extension. Do you have any other suggestions and do you think he would benefit from PT?

## 2020-03-25 NOTE — Telephone Encounter (Signed)
Patient called back to discuss.

## 2020-03-26 ENCOUNTER — Ambulatory Visit (INDEPENDENT_AMBULATORY_CARE_PROVIDER_SITE_OTHER): Payer: Medicare HMO | Admitting: Pharmacist

## 2020-03-26 ENCOUNTER — Other Ambulatory Visit: Payer: Self-pay

## 2020-03-26 DIAGNOSIS — I480 Paroxysmal atrial fibrillation: Secondary | ICD-10-CM | POA: Diagnosis not present

## 2020-03-26 DIAGNOSIS — I6310 Cerebral infarction due to embolism of unspecified precerebral artery: Secondary | ICD-10-CM | POA: Diagnosis not present

## 2020-03-26 DIAGNOSIS — Z7901 Long term (current) use of anticoagulants: Secondary | ICD-10-CM | POA: Diagnosis not present

## 2020-03-26 LAB — POCT INR: INR: 2.8 (ref 2.0–3.0)

## 2020-03-26 NOTE — Patient Instructions (Signed)
Continue taking 1 tablet daily except 1.5 tablets each Wednesday.  Repeat INR in 6 weeks. 

## 2020-03-26 NOTE — Telephone Encounter (Signed)
Returned patient call. Left message to call back.

## 2020-03-27 ENCOUNTER — Other Ambulatory Visit: Payer: Self-pay

## 2020-03-27 DIAGNOSIS — M21371 Foot drop, right foot: Secondary | ICD-10-CM

## 2020-03-27 DIAGNOSIS — R2681 Unsteadiness on feet: Secondary | ICD-10-CM

## 2020-03-27 NOTE — Telephone Encounter (Signed)
Put in PT referral to [facility]. Will call patient to notify.

## 2020-03-27 NOTE — Telephone Encounter (Signed)
Left message stating PT referral was put in.

## 2020-03-27 NOTE — Telephone Encounter (Signed)
Pt called, read Dr. Thompson Caul response regarding the possibility of PT. Pt reiterated that the brace did not work and was frustrated that his issue was unresolved. Advd pt to contact us if he had further questions or wanted the PT referral.

## 2020-03-31 ENCOUNTER — Other Ambulatory Visit: Payer: Self-pay | Admitting: Cardiology

## 2020-03-31 DIAGNOSIS — E785 Hyperlipidemia, unspecified: Secondary | ICD-10-CM

## 2020-04-15 ENCOUNTER — Encounter: Payer: Self-pay | Admitting: Internal Medicine

## 2020-04-15 ENCOUNTER — Ambulatory Visit (INDEPENDENT_AMBULATORY_CARE_PROVIDER_SITE_OTHER): Payer: Medicare HMO | Admitting: Internal Medicine

## 2020-04-15 ENCOUNTER — Other Ambulatory Visit: Payer: Self-pay

## 2020-04-15 VITALS — BP 190/84 | HR 53 | Temp 97.9°F | Resp 16 | Ht 73.0 in | Wt 184.0 lb

## 2020-04-15 DIAGNOSIS — I480 Paroxysmal atrial fibrillation: Secondary | ICD-10-CM

## 2020-04-15 DIAGNOSIS — N400 Enlarged prostate without lower urinary tract symptoms: Secondary | ICD-10-CM | POA: Diagnosis not present

## 2020-04-15 DIAGNOSIS — E785 Hyperlipidemia, unspecified: Secondary | ICD-10-CM

## 2020-04-15 DIAGNOSIS — Z0000 Encounter for general adult medical examination without abnormal findings: Secondary | ICD-10-CM

## 2020-04-15 DIAGNOSIS — I6310 Cerebral infarction due to embolism of unspecified precerebral artery: Secondary | ICD-10-CM

## 2020-04-15 DIAGNOSIS — I10 Essential (primary) hypertension: Secondary | ICD-10-CM | POA: Diagnosis not present

## 2020-04-15 MED ORDER — EDARBYCLOR 40-12.5 MG PO TABS: 1.0000 | ORAL_TABLET | Freq: Every day | ORAL | 0 refills | Status: DC

## 2020-04-15 MED ORDER — METOPROLOL TARTRATE 25 MG PO TABS: 25.0000 mg | ORAL_TABLET | Freq: Two times a day (BID) | ORAL | 0 refills | Status: DC

## 2020-04-15 NOTE — Progress Notes (Signed)
Subjective:  Patient ID: [name], male    DOB: 08-17-1946  Age: 74 y.o. MRN: [id number]  CC: Annual Exam, Hypertension, Hyperlipidemia, and Atrial Fibrillation  This visit occurred during the SARS-CoV-2 public health emergency.  Safety protocols were in place, including screening questions prior to the visit, additional usage of staff PPE, and extensive cleaning of exam room while observing appropriate contact time as indicated for disinfecting solutions.    HPI [name] presents for a CPX.  He does not monitor his blood pressure and has not recently been taking the ARB.  He tells me he is compliant with metoprolol.  He is active and denies any recent episodes of chest pain, shortness of breath, palpitations, edema, fatigue, headache, blurred vision, near syncope, dizziness, or lightheadedness.  Outpatient Medications Prior to Visit  Medication Sig Dispense Refill   acetaminophen (TYLENOL) 325 MG tablet Take 2 tablets (650 mg total) by mouth every 4 (four) hours as needed for headache or mild pain.     atorvastatin (LIPITOR) 20 MG tablet TAKE 1 TABLET (20 MG TOTAL) BY MOUTH DAILY. NEEDS OV FOR REFILL 90 tablet 1   Cholecalciferol 50 MCG (2000 UT) TABS Take 1 tablet (2,000 Units total) by mouth daily. 90 tablet 1   warfarin (COUMADIN) 5 MG tablet TAKE 1 TO 1 AND 1/2 TABLETS BY MOUTH DAILY AS DIRECTED BY COUMADIN CLINIC 45 tablet 2   metoprolol tartrate (LOPRESSOR) 25 MG tablet TAKE 1 AND 1/2 TABLETS BY MOUTH TWICE DAILY 270 tablet 3   olmesartan (BENICAR) 20 MG tablet Take 1 tablet (20 mg total) by mouth daily. 90 tablet 1   NITROSTAT 0.4 MG SL tablet Place 1 tablet under the tongue as needed for chest pain.  (Patient not taking: Reported on 04/15/2020)  2   No facility-administered medications prior to visit.    ROS Review of Systems  Constitutional: Negative for appetite change, diaphoresis, fatigue and fever.  HENT: Negative.   Eyes: Negative.   Respiratory:  Negative for cough, chest tightness, shortness of breath and wheezing.   Cardiovascular: Negative for chest pain, palpitations and leg swelling.  Gastrointestinal: Negative for abdominal pain, constipation, diarrhea, nausea and vomiting.  Endocrine: Negative.  Negative for cold intolerance and heat intolerance.  Genitourinary: Negative.  Negative for difficulty urinating, dysuria, scrotal swelling and testicular pain.  Musculoskeletal: Negative for arthralgias and myalgias.  Skin: Negative.  Negative for color change.  Neurological: Negative.  Negative for dizziness, weakness, light-headedness, numbness and headaches.  Hematological: Negative for adenopathy. Does not bruise/bleed easily.  Psychiatric/Behavioral: Negative.     Objective:  BP (!) 190/84 (BP Location: Left Arm, Patient Position: Sitting, Cuff Size: Normal)    Pulse (!) 53    Temp 97.9 F (36.6 C) (Oral)    Resp 16    Ht 6\' 1"  (1.854 m)    Wt 184 lb (83.5 kg)    SpO2 95%    BMI 24.28 kg/m   BP Readings from Last 3 Encounters:  04/15/20 (!) 190/84  02/26/20 (!) 156/90  01/14/20 132/90    Wt Readings from Last 3 Encounters:  04/15/20 184 lb (83.5 kg)  02/26/20 184 lb (83.5 kg)  01/14/20 189 lb (85.7 kg)    Physical Exam Vitals reviewed. Exam conducted with a chaperone present [name]).  Constitutional:      Appearance: Normal appearance.  HENT:     Nose: Nose normal.     Mouth/Throat:     Mouth: Mucous membranes are moist.  Eyes:     General: No scleral icterus.    Conjunctiva/sclera: Conjunctivae normal.  Cardiovascular:     Rate and Rhythm: Regular rhythm. Bradycardia present.     Heart sounds: No murmur heard.      Comments: EKG- Sinus bradycardia, 50 bpm Otherwise normal EKG Pulmonary:     Effort: Pulmonary effort is normal.     Breath sounds: No stridor. No wheezing, rhonchi or rales.  Abdominal:     General: Abdomen is flat.     Palpations: There is no mass.     Tenderness: There is no  abdominal tenderness. There is no guarding or rebound.     Hernia: There is no hernia in the left inguinal area or right inguinal area.  Genitourinary:    Pubic Area: No rash.      Penis: Normal and circumcised.      Testes: Normal.        Right: Mass not present.        Left: Mass not present.     Epididymis:     Right: Normal. Not inflamed or enlarged. No mass.     Left: Normal. Not inflamed or enlarged. No mass.     Prostate: Enlarged (1+ smooth symm BPH). Not tender and no nodules present.     Rectum: Normal. Guaiac result negative. No mass, tenderness, anal fissure, external hemorrhoid or internal hemorrhoid. Normal anal tone.  Musculoskeletal:     Cervical back: Neck supple.     Right lower leg: No edema.     Left lower leg: No edema.  Lymphadenopathy:     Lower Body: No right inguinal adenopathy. No left inguinal adenopathy.  Neurological:     Mental Status: He is alert.     Lab Results  Component Value Date   WBC 5.6 04/15/2020   HGB 15.1 04/15/2020   HCT 43.9 04/15/2020   PLT 171 04/15/2020   GLUCOSE 91 04/15/2020   CHOL 128 04/15/2020   TRIG 52 04/15/2020   HDL 64 04/15/2020   LDLCALC 51 04/15/2020   ALT 23 04/15/2020   AST 29 04/15/2020   NA 139 04/15/2020   K 4.1 04/15/2020   CL 104 04/15/2020   CREATININE 0.70 04/15/2020   BUN 11 04/15/2020   CO2 25 04/15/2020   TSH 2.41 04/15/2020   PSA 2.5 04/15/2020   INR 2.8 03/26/2020   HGBA1C 5.7 (H) 09/05/2014    No results found.  Assessment & Plan:   [name] was seen today for annual exam, hypertension, hyperlipidemia and atrial fibrillation.  Diagnoses and all orders for this visit:  Essential hypertension- His blood pressure is not adequately well controlled.  We will need to decrease the dose of metoprolol.  I have asked him to start taking an ARB and thiazide diuretic. -     CBC with Differential/Platelet; Future -     Basic metabolic panel; Future -     TSH; Future -     Urinalysis, Routine w  reflex microscopic; Future -     EKG 12-Lead -     Azilsartan-Chlorthalidone (EDARBYCLOR) 40-12.5 MG TABS; Take 1 tablet by mouth daily. -     metoprolol tartrate (LOPRESSOR) 25 MG tablet; Take 1 tablet (25 mg total) by mouth 2 (two) times daily. -     Urinalysis, Routine w reflex microscopic -     TSH -     Basic metabolic panel -     CBC with Differential/Platelet  PAF- CHADVASC -5- He has  had no recent palpitations.  He is anticoagulated with Coumadin.  His heart rate is down to 50 so I recommended a decrease in the dose of metoprolol. -     TSH; Future -     EKG 12-Lead -     metoprolol tartrate (LOPRESSOR) 25 MG tablet; Take 1 tablet (25 mg total) by mouth 2 (two) times daily. -     TSH  Benign prostatic hyperplasia without lower urinary tract symptoms- His PSA is normal which is a reassuring sign that he does not have prostate cancer.  He has no symptoms that need to be treated. -     PSA; Future -     Urinalysis, Routine w reflex microscopic; Future -     Urinalysis, Routine w reflex microscopic -     PSA  Hyperlipidemia with target LDL less than 100- He has achieved his LDL goal and is doing well on the statin. -     Lipid panel; Future -     TSH; Future -     Hepatic function panel; Future -     Hepatic function panel -     TSH -     Lipid panel  Routine general medical examination at a health care facility- Exam completed, labs reviewed, vaccines reviewed and updated, no cancer screenings are indicated, patient education was given.  Cerebrovascular accident (CVA) due to embolism of precerebral artery ([place])  Other orders -     MICROSCOPIC MESSAGE   I have discontinued Quillian Quince C. Buelna's olmesartan and metoprolol tartrate. I am also having him start on Edarbyclor and metoprolol tartrate. Additionally, I am having him maintain his acetaminophen, Nitrostat, Cholecalciferol, warfarin, and atorvastatin.  Meds ordered this encounter  Medications    Azilsartan-Chlorthalidone (EDARBYCLOR) 40-12.5 MG TABS    Sig: Take 1 tablet by mouth daily.    Dispense:  90 tablet    Refill:  0   metoprolol tartrate (LOPRESSOR) 25 MG tablet    Sig: Take 1 tablet (25 mg total) by mouth 2 (two) times daily.    Dispense:  180 tablet    Refill:  0     Follow-up: Return in about 4 weeks (around 05/13/2020).  Scarlette Calico, MD

## 2020-04-15 NOTE — Patient Instructions (Signed)

## 2020-04-16 LAB — URINALYSIS, ROUTINE W REFLEX MICROSCOPIC
Bacteria, UA: NONE SEEN /HPF
Bilirubin Urine: NEGATIVE
Glucose, UA: NEGATIVE
Hgb urine dipstick: NEGATIVE
Hyaline Cast: NONE SEEN /LPF
Ketones, ur: NEGATIVE
Nitrite: NEGATIVE
Protein, ur: NEGATIVE
Specific Gravity, Urine: 1.024 (ref 1.001–1.03)
Squamous Epithelial / HPF: NONE SEEN /HPF (ref <=5)
WBC, UA: NONE SEEN /HPF (ref 0–5)
pH: <=5 (ref 5.0–8.0)

## 2020-04-16 LAB — BASIC METABOLIC PANEL
BUN: 11 mg/dL (ref 7–25)
CO2: 25 mmol/L (ref 20–32)
Calcium: 9.1 mg/dL (ref 8.6–10.3)
Chloride: 104 mmol/L (ref 98–110)
Creat: 0.7 mg/dL (ref 0.70–1.18)
Glucose, Bld: 91 mg/dL (ref 65–99)
Potassium: 4.1 mmol/L (ref 3.5–5.3)
Sodium: 139 mmol/L (ref 135–146)

## 2020-04-16 LAB — HEPATIC FUNCTION PANEL
AG Ratio: 1.6 (calc) (ref 1.0–2.5)
ALT: 23 U/L (ref 9–46)
AST: 29 U/L (ref 10–35)
Albumin: 4.1 g/dL (ref 3.6–5.1)
Alkaline phosphatase (APISO): 55 U/L (ref 35–144)
Bilirubin, Direct: 0.2 mg/dL (ref 0.0–0.2)
Globulin: 2.6 g/dL (calc) (ref 1.9–3.7)
Indirect Bilirubin: 0.5 mg/dL (calc) (ref 0.2–1.2)
Total Bilirubin: 0.7 mg/dL (ref 0.2–1.2)
Total Protein: 6.7 g/dL (ref 6.1–8.1)

## 2020-04-16 LAB — CBC WITH DIFFERENTIAL/PLATELET
Absolute Monocytes: 560 cells/uL (ref 200–950)
Basophils Absolute: 28 cells/uL (ref 0–200)
Basophils Relative: 0.5 %
Eosinophils Absolute: 258 cells/uL (ref 15–500)
Eosinophils Relative: 4.6 %
HCT: 43.9 % (ref 38.5–50.0)
Hemoglobin: 15.1 g/dL (ref 13.2–17.1)
Lymphs Abs: 1775 cells/uL (ref 850–3900)
MCH: 31.9 pg (ref 27.0–33.0)
MCHC: 34.4 g/dL (ref 32.0–36.0)
MCV: 92.6 fL (ref 80.0–100.0)
MPV: 12.3 fL (ref 7.5–12.5)
Monocytes Relative: 10 %
Neutro Abs: 2979 cells/uL (ref 1500–7800)
Neutrophils Relative %: 53.2 %
Platelets: 171 10*3/uL (ref 140–400)
RBC: 4.74 10*6/uL (ref 4.20–5.80)
RDW: 12.3 % (ref 11.0–15.0)
Total Lymphocyte: 31.7 %
WBC: 5.6 10*3/uL (ref 3.8–10.8)

## 2020-04-16 LAB — PSA: PSA: 2.5 ng/mL (ref <=4.0)

## 2020-04-16 LAB — LIPID PANEL
Cholesterol: 128 mg/dL (ref <200)
HDL: 64 mg/dL (ref >=40)
LDL Cholesterol (Calc): 51 mg/dL (calc)
Non-HDL Cholesterol (Calc): 64 mg/dL (calc) (ref <130)
Total CHOL/HDL Ratio: 2 (calc) (ref <5.0)
Triglycerides: 52 mg/dL (ref <150)

## 2020-04-16 LAB — TSH: TSH: 2.41 mIU/L (ref 0.40–4.50)

## 2020-04-21 ENCOUNTER — Telehealth: Payer: Self-pay | Admitting: Internal Medicine

## 2020-04-21 NOTE — Telephone Encounter (Signed)
New message:   Pt is calling and would like a printout of his lab results sent to him by mail. Please advise.

## 2020-04-21 NOTE — Telephone Encounter (Signed)
Results printed and sent per pt request.

## 2020-05-07 ENCOUNTER — Ambulatory Visit (INDEPENDENT_AMBULATORY_CARE_PROVIDER_SITE_OTHER): Payer: Medicare HMO

## 2020-05-07 ENCOUNTER — Other Ambulatory Visit: Payer: Self-pay

## 2020-05-07 DIAGNOSIS — Z7901 Long term (current) use of anticoagulants: Secondary | ICD-10-CM | POA: Diagnosis not present

## 2020-05-07 DIAGNOSIS — I480 Paroxysmal atrial fibrillation: Secondary | ICD-10-CM

## 2020-05-07 DIAGNOSIS — I6310 Cerebral infarction due to embolism of unspecified precerebral artery: Secondary | ICD-10-CM | POA: Diagnosis not present

## 2020-05-07 LAB — POCT INR: INR: 2.7 (ref 2.0–3.0)

## 2020-05-07 NOTE — Patient Instructions (Signed)
Continue taking 1 tablet daily except 1.5 tablets each Wednesday.  Repeat INR in 6 weeks.

## 2020-05-14 ENCOUNTER — Encounter: Payer: Self-pay | Admitting: Physical Therapy

## 2020-05-14 ENCOUNTER — Other Ambulatory Visit: Payer: Self-pay

## 2020-05-14 ENCOUNTER — Ambulatory Visit: Payer: Medicare HMO | Attending: Family Medicine | Admitting: Physical Therapy

## 2020-05-14 DIAGNOSIS — R262 Difficulty in walking, not elsewhere classified: Secondary | ICD-10-CM | POA: Diagnosis not present

## 2020-05-14 DIAGNOSIS — R2681 Unsteadiness on feet: Secondary | ICD-10-CM | POA: Insufficient documentation

## 2020-05-14 DIAGNOSIS — M25671 Stiffness of right ankle, not elsewhere classified: Secondary | ICD-10-CM | POA: Insufficient documentation

## 2020-05-14 NOTE — Patient Instructions (Signed)
Access Code: [email or web address] URL: [email or web address] Date: 05/14/2020 Prepared by: [facility]  Exercises .Seated Gastroc Stretch with Strap - 3 x daily - 7 x weekly - 1 sets - 3 reps - 30 seconds hold    [facility] [address], [place], [place] [address] Phone # [phone] Fax [phone]

## 2020-05-15 NOTE — Therapy (Signed)
[facility] [address] [place], [place], [address] Phone: [phone]   Fax:  [phone]  Physical Therapy Evaluation  Patient Details  Name: [name] MRN: [id number] Date of Birth: Apr 26, 1946 Referring Provider (PT): Hulan Saas, DO   Encounter Date: 05/14/2020   PT End of Session - 05/14/20 1332    Visit Number 1    Date for PT Re-Evaluation 07/14/20    Authorization Type [employer]    Authorization Time Period 05/14/20 to 07/14/20    PT Start Time 1145    PT Stop Time 1230    PT Time Calculation (min) 45 min    Equipment Utilized During Treatment Other (comment)   AFO   Activity Tolerance No increased pain;Patient tolerated treatment well    Behavior During Therapy [facility] for tasks assessed/performed           Past Medical History:  Diagnosis Date  . Alcohol abuse, unspecified   . Anginal pain ([place])   . Arthritis    in back and shoulder  . H/O renal calculi   . History of kidney stones   . Hyperlipidemia    preTx 114, postTx 47 LDL  . Hypertension   . Shortness of breath dyspnea    with exertion  . Stroke [facility])    August '11 x 2: L. cerebellar, lateral medulla, ol rd lacunar infarcts;  2nd left pontine infarct.    Past Surgical History:  Procedure Laterality Date  . BACK SURGERY  1986  . CORONARY ARTERY BYPASS GRAFT N/A 09/06/2014   Procedure: CORONARY ARTERY BYPASS GRAFTING (CABG);  Surgeon: Ivin Poot, MD;  Location: [place];  Service: Open Heart Surgery;  Laterality: N/A;  CABG times 4, using internal mammary artery, and right leg saphenous vein harvested endoscopically  . LEFT HEART CATHETERIZATION WITH CORONARY ANGIOGRAM N/A 08/29/2014   Procedure: LEFT HEART CATHETERIZATION WITH CORONARY ANGIOGRAM;  Surgeon: Lorretta Harp, MD;  Location: [facility] CATH LAB;  Service: Cardiovascular;  Laterality: N/A;  . LUMBAR DISC SURGERY  1986   Dr. Juanetta Snow  . ROTATOR CUFF REPAIR  06/10/12   at  [facility]...left shoulder  . TEE WITHOUT CARDIOVERSION N/A 09/06/2014   Procedure: TRANSESOPHAGEAL ECHOCARDIOGRAM (TEE);  Surgeon: Ivin Poot, MD;  Location: [place];  Service: Open Heart Surgery;  Laterality: N/A;  . TONSILLECTOMY  1954  . TONSILLECTOMY      There were no vitals filed for this visit.    Subjective Assessment - 05/14/20 1147    Subjective Pt reports having 2 strokes almsot 10 years ago. He had rehab and was doing well with his walking. In the last 5 years or so, he noticed a change in his walking. He wears a brace to help with his footdrop but this did not help with his knee hyperextension. The hyperextension is the biggest issue for him.    Pertinent History stroke x2 in 2011    Patient Stated Goals improve walking and knee extension    Currently in Pain? No/denies              [facility] PT Assessment - 05/15/20 0001      Assessment   Medical Diagnosis gait instability; Rt foot drop     Referring Provider (PT) Hulan Saas, DO    Onset Date/Surgical Date --   last 5 years it's gotten worse    Prior Therapy none since after his stroke 10 years ago  Precautions   Precaution Comments wearing Lt AFO      Restrictions   Weight Bearing Restrictions No      Balance Screen   Has the patient fallen in the past 6 months No    Has the patient had a decrease in activity level because of a fear of falling?  No    Is the patient reluctant to leave their home because of a fear of falling?  No      Home Environment   Living Environment Private residence    Additional Comments 10 steps from the garage to the back door, 15 steps up to the rest of the house       Prior Function   Level of Independence Independent      Cognition   Overall Cognitive Status Within Functional Limits for tasks assessed      PROM   Overall PROM Comments Rt ankle DF: -10 deg (knee extended), knee flexed 0 deg       Strength   Overall Strength Comments Rt ankle DF through  available range is 4/5 MMT      Flexibility   Soft Tissue Assessment /Muscle Length yes    Hamstrings 90/90: Rt 50 deg, Lt 45 deg     Quadriceps prone: Rt 90 deg, Lt 100 deg      Ambulation/Gait   Gait Comments with AFO: improved genu-recurvatum, intermittent catching his foot when attempting to step through, without AFO: excessive genu-recurvatum, excessive hip hike to clear foot                       Objective measurements completed on examination: See above findings.       [facility] PT Treatment/Exercise - 05/15/20 0001      Exercises   Exercises Ankle      Ankle Exercises: Stretches   Gastroc Stretch 1 rep;30 seconds    Gastroc Stretch Limitations seated with strap                  PT Education - 05/14/20 1331    Education Details eval findings/POC; implemented HEP    Person(s) Educated Patient    Methods Explanation;Handout;Verbal cues    Comprehension Verbalized understanding;Returned demonstration            PT Short Term Goals - 05/14/20 1339      PT SHORT TERM GOAL #1   Title Pt will be independent with his initial HEP.    Time 8    Period Weeks    Status New             PT Long Term Goals - 05/14/20 1339      PT LONG TERM GOAL #1   Title Pt will have improved gastroc flexibility to allow atleast 5 deg of active DF on the Rt.    Time 8    Period Weeks    Status New      PT LONG TERM GOAL #2   Title Pt will have improved hamstring flexibility, lacking no more than 35 deg of knee extension during 90/90 testing.    Time 8    Period Weeks    Status New      PT LONG TERM GOAL #3   Title Pt will have improved Rt quadriceps flexibility to lacking no more than 100 deg of prone knee flexion.    Time 8    Period Weeks    Status New  PT LONG TERM GOAL #4   Title Pt will be able to ambulate without his AFO atleast 196ft with adequate foot clearance which will decrease his risk of falling while ambulating around his home.      Time 8    Period Weeks    Status New                  Plan - 05/14/20 1333    Clinical Impression Statement Pt is a 74 y.o M referred to OPPT with concerns of worsening gait and knee hyperextension over the last 5 years. He had 2 strokes almost 10 years ago and was working without limitation for several years following this. After her retired, he found himself to be less active and has noticed worsening gait and balance. He was given an AFO in June of this year which helps some with his knee hyperextension, but he has difficulty clearing his foot and controlling the knee in standing positions. Pt has significant tightness in the hamstrings, quadriceps, and gastroc. He is unable to achieve ankle neutral in a knee extended position which is likely a large contributor to his hyperextension. He would benefit from skilled PT to address his restrictions in flexibility and strength to increase his safety with ambulation and other daily activity.    Personal Factors and Comorbidities Age;Time since onset of injury/illness/exacerbation;Fitness;Comorbidity 1    Comorbidities previous stroke    Examination-Activity Limitations Stairs;Locomotion Level    Stability/Clinical Decision Making Stable/Uncomplicated    Clinical Decision Making Low    Rehab Potential Good    PT Frequency 1x / week    PT Duration 8 weeks    PT Treatment/Interventions ADLs/Self Care Home Management;Electrical Stimulation;Stair training;Gait training;Functional mobility training;Manual techniques;Dry needling;Passive range of motion;Neuromuscular re-education;Balance training;Orthotic Fit/Training;Therapeutic exercise;Therapeutic activities;Patient/family education;Taping    PT Next Visit Plan stretching for hamstring/quad/gastroc-add to HEP; hamstring strength; possible d/n to gastroc    PT Home Exercise Plan Access Code: [email or web address]    Consulted and Agree with Plan of Care Patient           Patient will benefit from  skilled therapeutic intervention in order to improve the following deficits and impairments:  Abnormal gait, Decreased balance, Difficulty walking, Impaired flexibility, Hypomobility, Decreased strength, Decreased range of motion, Impaired tone, Improper body mechanics  Visit Diagnosis: Unsteadiness on feet  Stiffness of right ankle, not elsewhere classified  Difficulty in walking, not elsewhere classified     Problem List Patient Active Problem List   Diagnosis Date Noted  . Long term (current) use of anticoagulants 05/08/2018  . Gait instability 08/12/2016  . Right foot drop 08/12/2016  . Hepatitis C antibody test positive 02/27/2015  . Routine general medical examination at a health care facility 02/25/2015  . PAF- CHADVASC -5 09/18/2014  . 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 History of colonic polyps 02/19/2014  . BPH (benign prostatic hyperplasia) 02/19/2014  . Hyperlipidemia with target LDL less than 100   . Stroke ([place])   . Hypertension   . Alcohol abuse, unspecified    5:04 PM,05/15/20 Sherol Dade PT, [place] [facility] [place]  [place] [address], [place], [place], [address] Phone: [phone]   Fax:  [phone]  Name: [name] MRN: [id number] Date of Birth: 04/13/1946

## 2020-05-21 ENCOUNTER — Other Ambulatory Visit: Payer: Self-pay

## 2020-05-21 ENCOUNTER — Ambulatory Visit: Payer: Medicare HMO

## 2020-05-21 DIAGNOSIS — M25671 Stiffness of right ankle, not elsewhere classified: Secondary | ICD-10-CM | POA: Diagnosis not present

## 2020-05-21 DIAGNOSIS — R262 Difficulty in walking, not elsewhere classified: Secondary | ICD-10-CM | POA: Diagnosis not present

## 2020-05-21 DIAGNOSIS — R2681 Unsteadiness on feet: Secondary | ICD-10-CM | POA: Diagnosis not present

## 2020-05-21 NOTE — Therapy (Signed)
[facility] [address] [place], [place], [address] Phone: [phone]   Fax:  [phone]  Physical Therapy Treatment  Patient Details  Name: [name] MRN: [id number] Date of Birth: 1946/01/21 Referring Provider (PT): Hulan Saas, DO   Encounter Date: 05/21/2020   PT End of Session - 05/21/20 1058    Visit Number 2    Date for PT Re-Evaluation 07/14/20    Authorization Type [employer]    PT Start Time 1018    PT Stop Time 1059    PT Time Calculation (min) 41 min    Activity Tolerance No increased pain;Patient tolerated treatment well    Behavior During Therapy [facility] for tasks assessed/performed           Past Medical History:  Diagnosis Date  . Alcohol abuse, unspecified   . Anginal pain ([place])   . Arthritis    in back and shoulder  . H/O renal calculi   . History of kidney stones   . Hyperlipidemia    preTx 114, postTx 47 LDL  . Hypertension   . Shortness of breath dyspnea    with exertion  . Stroke [facility])    August '11 x 2: L. cerebellar, lateral medulla, ol rd lacunar infarcts;  2nd left pontine infarct.    Past Surgical History:  Procedure Laterality Date  . BACK SURGERY  1986  . CORONARY ARTERY BYPASS GRAFT N/A 09/06/2014   Procedure: CORONARY ARTERY BYPASS GRAFTING (CABG);  Surgeon: Ivin Poot, MD;  Location: [place];  Service: Open Heart Surgery;  Laterality: N/A;  CABG times 4, using internal mammary artery, and right leg saphenous vein harvested endoscopically  . LEFT HEART CATHETERIZATION WITH CORONARY ANGIOGRAM N/A 08/29/2014   Procedure: LEFT HEART CATHETERIZATION WITH CORONARY ANGIOGRAM;  Surgeon: Lorretta Harp, MD;  Location[facility] C[facility] LAB;  Service: Cardiovascular;  Laterality: N/A;  . LUMBAR DISC SURGERY  1986   Dr. Juanetta Snow  . ROTATOR CUFF REPAIR  06/10/12   at [place]...left shoulder  . TEE WITHOUT CARDIOVERSION N/A 09/06/2014   Procedure: TRANSESOPHAGEAL  ECHOCARDIOGRAM (TEE);  Surgeon: Ivin Poot, MD;  Location: [place];  Service: Open Heart Surgery;  Laterality: N/A;  . TONSILLECTOMY  1954  . TONSILLECTOMY      There were no vitals filed for this visit.   Subjective Assessment - 05/21/20 1014    Subjective Things are the same.  I've done the stretch that Clarise Cruz gave.    Currently in Pain? No/denies                             [facility] Adult PT Treatment/Exercise - 05/21/20 0001      Exercises   Exercises Knee/Hip;Lumbar      Lumbar Exercises: Stretches   Active Hamstring Stretch 3 reps;20 seconds;Left;Right      Knee/Hip Exercises: Stretches   Hip Flexor Stretch 3 reps;20 seconds    Hip Flexor Stretch Limitations using step      Knee/Hip Exercises: Standing   Hip Abduction Stengthening;Both;2 sets;10 reps;Knee straight    Abduction Limitations verbal cues to unlock the standing leg    Hip Extension Stengthening;Both;2 sets;10 reps;Knee straight    Forward Step Up Both;1 set;Step Height: 6";15 reps;Hand Hold: 1    Forward Step Up Limitations max verbal cues to use only the UE support needed for stability      Ankle Exercises: Stretches   Gastroc  Stretch 1 rep;30 seconds    Gastroc Stretch Limitations seated with strap, standing edge of step      Ankle Exercises: Aerobic   Nustep Level 2x 8 minutes - PT present to monitor for fatigue   Legs only                 PT Education - 05/21/20 1056    Education Details Access Code: [email or web address]    Person(s) Educated Patient    Methods Explanation;Demonstration;Handout    Comprehension Verbalized understanding;Returned demonstration            PT Short Term Goals - 05/14/20 1339      PT SHORT TERM GOAL #1   Title Pt will be independent with his initial HEP.    Time 8    Period Weeks    Status New             PT Long Term Goals - 05/14/20 1339      PT LONG TERM GOAL #1   Title Pt will have improved gastroc flexibility to allow atleast 5 deg  of active DF on the Rt.    Time 8    Period Weeks    Status New      PT LONG TERM GOAL #2   Title Pt will have improved hamstring flexibility, lacking no more than 35 deg of knee extension during 90/90 testing.    Time 8    Period Weeks    Status New      PT LONG TERM GOAL #3   Title Pt will have improved Rt quadriceps flexibility to lacking no more than 100 deg of prone knee flexion.    Time 8    Period Weeks    Status New      PT LONG TERM GOAL #4   Title Pt will be able to ambulate without his AFO atleast 150ft with adequate foot clearance which will decrease his risk of falling while ambulating around his home.    Time 8    Period Weeks    Status New                 Plan - 05/21/20 1026    Clinical Impression Statement Pt with first time follow-up after evaluation.  PT reviewed HEP for gastroc stretch and added to HEP to include hip stretching, additional positions for gastroc stretch and balance/strength.  Pt required max tactile and verbal cues for technique and stand by assistance for safety with standing exercise.  Pt will continue to benefit from skilled PT to address LE flexibility, strength and balance to improve safety and endurance for home and community tasks.    PT Frequency 1x / week    PT Duration 8 weeks    PT Treatment/Interventions ADLs/Self Care Home Management;Electrical Stimulation;Stair training;Gait training;Functional mobility training;Manual techniques;Dry needling;Passive range of motion;Neuromuscular re-education;Balance training;Orthotic Fit/Training;Therapeutic exercise;Therapeutic activities;Patient/family education;Taping    PT Next Visit Plan review HEP- more review is needed, balance, strength and endurance    PT Home Exercise Plan Access Code: [email or web address]           Patient will benefit from skilled therapeutic intervention in order to improve the following deficits and impairments:  Abnormal gait, Decreased balance, Difficulty walking,  Impaired flexibility, Hypomobility, Decreased strength, Decreased range of motion, Impaired tone, Improper body mechanics  Visit Diagnosis: Stiffness of right ankle, not elsewhere classified  Unsteadiness on feet  Difficulty in walking, not elsewhere classified     Problem List Patient Active  Problem List   Diagnosis Date Noted  . Long term (current) use of anticoagulants 05/08/2018  . Gait instability 08/12/2016  . Right foot drop 08/12/2016  . Hepatitis C antibody test positive 02/27/2015  . Routine general medical examination at a health care facility 02/25/2015  . PAF- CHADVASC -5 09/18/2014  . 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 History of colonic polyps 02/19/2014  . BPH (benign prostatic hyperplasia) 02/19/2014  . Hyperlipidemia with target LDL less than 100   . Stroke ([place])   . Hypertension   . Alcohol abuse, unspecified     Sigurd Sos, PT 05/21/20 11:07 AM  [facility] [address], [place], [place], [address] Phone: [phone]   Fax:  [phone]  Name: [name] MRN: [id number] Date of Birth: [birth year]

## 2020-05-21 NOTE — Patient Instructions (Signed)
Access Code: [email or web address] URL: [email or web address] Date: 05/21/2020 Prepared by: Claiborne Billings  Exercises   Seated Hamstring Stretch - 3 x daily - 7 x weekly - 1 sets - 3 reps - 30 hold Standing Bilateral Gastroc Stretch with Step - 1 x daily - 7 x weekly - 3 sets - 10 reps - 30 hold Forward Step Up - 2 x daily - 7 x weekly - 2 sets - 10 reps Standing Hip Abduction with Counter Support - 2 x daily - 7 x weekly - 2 sets - 10 reps Standing Hip Extension with Counter Support - 2 x daily - 7 x weekly - 2 sets - 10 reps

## 2020-05-28 ENCOUNTER — Encounter: Payer: Self-pay | Admitting: Physical Therapy

## 2020-05-28 ENCOUNTER — Ambulatory Visit: Payer: Medicare HMO | Attending: Family Medicine | Admitting: Physical Therapy

## 2020-05-28 ENCOUNTER — Other Ambulatory Visit: Payer: Self-pay

## 2020-05-28 DIAGNOSIS — R262 Difficulty in walking, not elsewhere classified: Secondary | ICD-10-CM | POA: Diagnosis not present

## 2020-05-28 DIAGNOSIS — R2681 Unsteadiness on feet: Secondary | ICD-10-CM

## 2020-05-28 DIAGNOSIS — M25671 Stiffness of right ankle, not elsewhere classified: Secondary | ICD-10-CM

## 2020-05-28 NOTE — Patient Instructions (Signed)
Access Code: [email or web address] URL: [email or web address] Date: 05/28/2020 Prepared by: [place]  Exercises Seated Gastroc Stretch with Strap - 3 x daily - 7 x weekly - 1 sets - 3 reps - 30 seconds hold Standing Bilateral Gastroc Stretch with Step - 3 x daily - 7 x weekly - 1 sets - 3 reps - 30 seconds hold Forward Step Up - 2 x daily - 7 x weekly - 2 sets - 10 reps Standing Hamstring Stretch on Chair - 1 x daily - 7 x weekly - 3 reps - 30 seconds hold Standing Hip Abduction with Resistance at Ankles and Counter Support - 1 x daily - 7 x weekly - 2-3 sets - 5 reps Seated Ankle Dorsiflexion AROM - 7 x weekly  [facility] [address] [place], [place] [address] Phone # [phone] Fax [phone]

## 2020-05-28 NOTE — Therapy (Addendum)
[facility] [address] [place], [place], [address] Phone: [phone]   Fax:  [phone]  Physical Therapy Treatment/Discharge  Patient Details  Name: [name] MRN: [id number] Date of Birth: 12-Jul-1946 Referring Provider (PT): Hulan Saas, DO   Encounter Date: 05/28/2020   PT End of Session - 05/28/20 1024    Visit Number 3    Date for PT Re-Evaluation 07/14/20    Authorization Type [employer]    PT Start Time 7001    PT Stop Time 1059    PT Time Calculation (min) 44 min    Activity Tolerance No increased pain;Patient tolerated treatment well    Behavior During Therapy [facility] for tasks assessed/performed           Past Medical History:  Diagnosis Date  . Alcohol abuse, unspecified   . Anginal pain ([place])   . Arthritis    in back and shoulder  . H/O renal calculi   . History of kidney stones   . Hyperlipidemia    preTx 114, postTx 47 LDL  . Hypertension   . Shortness of breath dyspnea    with exertion  . Stroke [facility])    August '11 x 2: L. cerebellar, lateral medulla, ol rd lacunar infarcts;  2nd left pontine infarct.    Past Surgical History:  Procedure Laterality Date  . BACK SURGERY  1986  . CORONARY ARTERY BYPASS GRAFT N/A 09/06/2014   Procedure: CORONARY ARTERY BYPASS GRAFTING (CABG);  Surgeon: Ivin Poot, MD;  Location: [place];  Service: Open Heart Surgery;  Laterality: N/A;  CABG times 4, using internal mammary artery, and right leg saphenous vein harvested endoscopically  . LEFT HEART CATHETERIZATION WITH CORONARY ANGIOGRAM N/A 08/29/2014   Procedure: LEFT HEART CATHETERIZATION WITH CORONARY ANGIOGRAM;  Surgeon: Lorretta Harp, MD;  Location: [facility] CATH LAB;  Service: Cardiovascular;  Laterality: N/A;  . LUMBAR DISC SURGERY  1986   Dr. Juanetta Snow  . ROTATOR CUFF REPAIR  06/10/12   at [place]...left shoulder  . TEE WITHOUT CARDIOVERSION N/A 09/06/2014   Procedure:  TRANSESOPHAGEAL ECHOCARDIOGRAM (TEE);  Surgeon: Ivin Poot, MD;  Location: [place];  Service: Open Heart Surgery;  Laterality: N/A;  . TONSILLECTOMY  1954  . TONSILLECTOMY      There were no vitals filed for this visit.   Subjective Assessment - 05/28/20 1018    Subjective Pt states that things are going well. He would like more challenging exercises.    Currently in Pain? No/denies                             [facility] Adult PT Treatment/Exercise - 05/28/20 0001      Knee/Hip Exercises: Stretches   Passive Hamstring Stretch 2 reps;Both;30 seconds    Passive Hamstring Stretch Limitations LE on 2nd step     Gastroc Stretch Right;2 reps;30 seconds    Gastroc Stretch Limitations 1 on step, 1 against wall       Knee/Hip Exercises: Machines for Strengthening   Total Gym Leg Press Seat 9: 100 2x10 reps; Rt only #60 2x15 reps - avoiding knee hyperextension      Knee/Hip Exercises: Standing   Hip Abduction Stengthening;Right;3 sets;5 reps;Knee straight    Abduction Limitations yellow TB around feet, moved to ankles for last 2 sets       Ankle Exercises: Seated   Other Seated Ankle Exercises Rt ankle DF with  yellow TB x10 reps, seated active ankle DF for home x20 reps                   PT Education - 05/28/20 1103    Education Details technique with therex; updates to HEP-importance of completing HEP    Person(s) Educated Patient    Methods Explanation;Handout;Verbal cues    Comprehension Verbalized understanding;Returned demonstration            PT Short Term Goals - 05/14/20 1339      PT SHORT TERM GOAL #1   Title Pt will be independent with his initial HEP.    Time 8    Period Weeks    Status New             PT Long Term Goals - 05/14/20 1339      PT LONG TERM GOAL #1   Title Pt will have improved gastroc flexibility to allow atleast 5 deg of active DF on the Rt.    Time 8    Period Weeks    Status New      PT LONG TERM GOAL #2    Title Pt will have improved hamstring flexibility, lacking no more than 35 deg of knee extension during 90/90 testing.    Time 8    Period Weeks    Status New      PT LONG TERM GOAL #3   Title Pt will have improved Rt quadriceps flexibility to lacking no more than 100 deg of prone knee flexion.    Time 8    Period Weeks    Status New      PT LONG TERM GOAL #4   Title Pt will be able to ambulate without his AFO atleast 124f with adequate foot clearance which will decrease his risk of falling while ambulating around his home.    Time 8    Period Weeks    Status New                 Plan - 05/28/20 1105    Clinical Impression Statement Pt arrived with multiple questions and concerns regarding wearing his AFO and other components of his HEP. PT reviewed his HEP and made updates to increase challenge, per pt's request. PT provided education regarding the importance of full HEP adherence. Pt was able to complete leg press with emphasis on knee control and avoiding hyperextension. Pt demonstrated understanding of his HEP. Will continue wit current POC.    PT Frequency 1x / week    PT Duration 8 weeks    PT Treatment/Interventions ADLs/Self Care Home Management;Electrical Stimulation;Stair training;Gait training;Functional mobility training;Manual techniques;Dry needling;Passive range of motion;Neuromuscular re-education;Balance training;Orthotic Fit/Training;Therapeutic exercise;Therapeutic activities;Patient/family education;Taping    PT Next Visit Plan f/u on HEP adherence; ankle TB resistance, leg press progression; balance, strength and endurance    PT Home Exercise Plan Access Code: [email or web address]           Patient will benefit from skilled therapeutic intervention in order to improve the following deficits and impairments:  Abnormal gait, Decreased balance, Difficulty walking, Impaired flexibility, Hypomobility, Decreased strength, Decreased range of motion, Impaired tone, Improper  body mechanics  Visit Diagnosis: Stiffness of right ankle, not elsewhere classified  Unsteadiness on feet  Difficulty in walking, not elsewhere classified     Problem List Patient Active Problem List   Diagnosis Date Noted  . Long term (current) use of anticoagulants 05/08/2018  . Gait instability 08/12/2016  . Right foot drop  08/12/2016  . Hepatitis C antibody test positive 02/27/2015  . Routine general medical examination at a health care facility 02/25/2015  . PAF- CHADVASC -5 09/18/2014  . 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 History of colonic polyps 02/19/2014  . BPH (benign prostatic hyperplasia) 02/19/2014  . Hyperlipidemia with target LDL less than 100   . Stroke ([place])   . Hypertension   . Alcohol abuse, unspecified     11:10 AM,05/28/20 Sherol Dade PT, DPT [place] [facility] [place]  [facility] [address], [place], [place], [address] Phone: [phone]   Fax:  [phone]  Name: [name] MRN: [id number] Date of Birth: April 17, 1946  *addendum to resolve episode of care and d/c pt from [place]  Visits from Start of Care: 3  Current functional level related to goals / functional outcomes: See above for more details    Remaining deficits: See above for more details    Education / Equipment: See above for more details   Plan: Patient agrees to discharge.  Patient goals were not met. Patient is being discharged due to the patient's request.  ?????    Pt requested discharge from PT. He feels he is not making progress. He has only attended 2 sessions since his evaluation and has not been completing his HEP despite encouragement from both treating therapists.   10:23 AM,06/04/20 [place], [place] [facility] [place]

## 2020-06-04 ENCOUNTER — Ambulatory Visit: Payer: Medicare HMO | Admitting: Physical Therapy

## 2020-06-04 ENCOUNTER — Other Ambulatory Visit: Payer: Self-pay

## 2020-06-10 ENCOUNTER — Ambulatory Visit: Payer: Medicare HMO

## 2020-06-18 ENCOUNTER — Other Ambulatory Visit: Payer: Self-pay

## 2020-06-18 ENCOUNTER — Ambulatory Visit (INDEPENDENT_AMBULATORY_CARE_PROVIDER_SITE_OTHER): Payer: Medicare HMO

## 2020-06-18 ENCOUNTER — Encounter: Payer: Medicare HMO | Admitting: Physical Therapy

## 2020-06-18 DIAGNOSIS — I480 Paroxysmal atrial fibrillation: Secondary | ICD-10-CM | POA: Diagnosis not present

## 2020-06-18 DIAGNOSIS — Z7901 Long term (current) use of anticoagulants: Secondary | ICD-10-CM | POA: Diagnosis not present

## 2020-06-18 DIAGNOSIS — I6310 Cerebral infarction due to embolism of unspecified precerebral artery: Secondary | ICD-10-CM

## 2020-06-18 LAB — POCT INR: INR: 3.1 — AB (ref 2.0–3.0)

## 2020-06-18 NOTE — Patient Instructions (Signed)
Will eat salad tonight and then Continue taking 1 tablet daily except 1.5 tablets each Wednesday.  Repeat INR in 6 weeks.

## 2020-07-02 ENCOUNTER — Encounter: Payer: Medicare HMO | Admitting: Physical Therapy

## 2020-07-03 ENCOUNTER — Telehealth: Payer: Self-pay | Admitting: Internal Medicine

## 2020-07-03 NOTE — Progress Notes (Signed)
  Chronic Care Management   Outreach Note  07/03/2020 Name: [name] MRN: [id number] DOB: [birth year]  Referred by: Janith Lima, MD Reason for referral : No chief complaint on file.   An unsuccessful telephone outreach was attempted today. The patient was referred to the pharmacist for assistance with care management and care coordination.   Follow Up Plan:   Carley Perdue [employer] Scheduler

## 2020-07-09 ENCOUNTER — Telehealth: Payer: Self-pay | Admitting: Internal Medicine

## 2020-07-09 ENCOUNTER — Encounter: Payer: Medicare HMO | Admitting: Physical Therapy

## 2020-07-09 NOTE — Progress Notes (Signed)
  Chronic Care Management   Outreach Note  07/09/2020 Name: [name] MRN: [id number] DOB: 07-02-1946  Referred by: Janith Lima, MD Reason for referral : No chief complaint on file.   A second unsuccessful telephone outreach was attempted today. The patient was referred to pharmacist for assistance with care management and care coordination.  Follow Up Plan:   Carley Perdue [employer] Scheduler

## 2020-07-16 ENCOUNTER — Encounter: Payer: Medicare HMO | Admitting: Physical Therapy

## 2020-07-28 NOTE — Progress Notes (Signed)
HPI: FU CAD. Also with h/oHTN, dyslipidemia, remote tobacco abuse, and posterior circulation CVA. Found to have severe three vessel coronary artery disease with preserved LVF at cath 08/29/14. He underwent CABG x 4 on 09/06/14(LIMA to the LAD, saphenous vein graft to the ramus intermedius, saphenous vein graft to the first obtuse marginal and saphenous vein graft to the right coronary artery).Preoperative echocardiogram December 2015 showed normal LV functionand grade 1 diastolic dysfunction. Preoperative carotid Dopplers December 2015 showed 1-39% bilateral stenosis.Also with PAF.Abdominal ultrasound October 2017 showed no aneurysm. Since last seen,  patient denies dyspnea, chest pain, palpitations or syncope.  Current Outpatient Medications  Medication Sig Dispense Refill  . acetaminophen (TYLENOL) 325 MG tablet Take 2 tablets (650 mg total) by mouth every 4 (four) hours as needed for headache or mild pain.    Marland Kitchen atorvastatin (LIPITOR) 20 MG tablet TAKE 1 TABLET (20 MG TOTAL) BY MOUTH DAILY. NEEDS OV FOR REFILL 90 tablet 1  . metoprolol tartrate (LOPRESSOR) 25 MG tablet Take 1 tablet (25 mg total) by mouth 2 (two) times daily. 180 tablet 0  . NITROSTAT 0.4 MG SL tablet Place 1 tablet under the tongue as needed for chest pain.   2  . warfarin (COUMADIN) 5 MG tablet TAKE 1 TO 1 AND 1/2 TABLETS BY MOUTH DAILY AS DIRECTED BY COUMADIN CLINIC 45 tablet 2   No current facility-administered medications for this visit.     Past Medical History:  Diagnosis Date  . Alcohol abuse, unspecified   . Anginal pain ([place])   . Arthritis    in back and shoulder  . H/O renal calculi   . History of kidney stones   . Hyperlipidemia    preTx 114, postTx 47 LDL  . Hypertension   . Shortness of breath dyspnea    with exertion  . Stroke [facility])    August '11 x 2: L. cerebellar, lateral medulla, ol rd lacunar infarcts;  2nd left pontine infarct.    Past Surgical History:  Procedure Laterality Date   . BACK SURGERY  1986  . CORONARY ARTERY BYPASS GRAFT N/A 09/06/2014   Procedure: CORONARY ARTERY BYPASS GRAFTING (CABG);  Surgeon: Ivin Poot, MD;  Location: [place];  Service: Open Heart Surgery;  Laterality: N/A;  CABG times 4, using internal mammary artery, and right leg saphenous vein harvested endoscopically  . LEFT HEART CATHETERIZATION WITH CORONARY ANGIOGRAM N/A 08/29/2014   Procedure: LEFT HEART CATHETERIZATION WITH CORONARY ANGIOGRAM;  Surgeon: Lorretta Harp, MD;  Location: [facility] CATH LAB;  Service: Cardiovascular;  Laterality: N/A;  . LUMBAR DISC SURGERY  1986   Dr. Juanetta Snow  . ROTATOR CUFF REPAIR  06/10/12   at [place]...left shoulder  . TEE WITHOUT CARDIOVERSION N/A 09/06/2014   Procedure: TRANSESOPHAGEAL ECHOCARDIOGRAM (TEE);  Surgeon: Ivin Poot, MD;  Location: [place];  Service: Open Heart Surgery;  Laterality: N/A;  . TONSILLECTOMY  1954  . TONSILLECTOMY      Social History   Socioeconomic History  . Marital status: Married    Spouse name: Not on file  . Number of children: 2  . Years of education: 73  . Highest education level: Not on file  Occupational History  . Occupation: Administrator, arts - paint  Tobacco Use  . Smoking status: Former Smoker    Packs/day: 1.00    Years: 15.00    Pack years: 15.00    Types: Cigarettes    Quit date: 09/27/1978    Years  since quitting: 41.8  . Smokeless tobacco: Never Used  Vaping Use  . Vaping Use: Never used  Substance and Sexual Activity  . Alcohol use: Yes    Alcohol/week: 30.0 standard drinks    Types: 30 Cans of beer per week    Comment: stopped drinking after stroke. Had been drink 6 pk per day plus binge drinking  . Drug use: Yes    Types: Marijuana  . Sexual activity: Yes    Partners: Female  Other Topics Concern  . Not on file  Social History Narrative   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 Married '79. 1 dtr- '68, 1 step son. 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 Marriage in good health. He reports he is very strong in his faith.    Social Determinants of Health   Financial Resource Strain:   . Difficulty of Paying Living Expenses: Not on file  Food Insecurity:   . Worried About Charity fundraiser in the Last Year: Not on file  . Ran Out of Food in the Last Year: Not on file  Transportation Needs:   . Lack of Transportation (Medical): Not on file  . Lack of Transportation (Non-Medical): Not on file  Physical Activity:   . Days of Exercise per Week: Not on file  . Minutes of Exercise per Session: Not on file  Stress:   . Feeling of Stress : Not on file  Social Connections:   . Frequency of Communication with Friends and Family: Not on file  . Frequency of Social Gatherings with Friends and Family: Not on file  . Attends Religious Services: Not on file  . Active Member of Clubs or Organizations: Not on file  . Attends Archivist Meetings: Not on file  . Marital Status: Not on file  Intimate Partner Violence:   . Fear of Current or Ex-Partner: Not on file  . Emotionally Abused: Not on file  . Physically Abused: Not on file  . Sexually Abused: Not on file    Family History  Problem Relation Age of Onset  . Heart disease Father   . Hypertension Father   . Heart attack Father   . GI problems Mother   . Cancer Neg Hx   . Diabetes Neg Hx   . Early death Neg Hx   . Hearing loss Neg Hx   . Hyperlipidemia Neg Hx   . Kidney disease Neg Hx   . Stroke Neg Hx   . Alcohol abuse Neg Hx     ROS: no fevers or chills, productive cough, hemoptysis, dysphasia, odynophagia, melena, hematochezia, dysuria, hematuria, rash, seizure activity, orthopnea, PND, pedal edema, claudication. Remaining systems are negative.  Physical Exam: Well-developed well-nourished in no acute distress.  Skin is warm and dry.  HEENT is normal.  Neck is supple.  Chest is clear to auscultation with normal  expansion.  Cardiovascular exam is regular rate and rhythm.  Abdominal exam nontender or distended. No masses palpated. Extremities show no edema. neuro grossly intact  A/P  1 coronary artery disease status post coronary artery bypass graft-patient doing well with no recurrent chest pain.  Plan to continue statin.  No aspirin given need for anticoagulation.  2 paroxysmal atrial fibrillation-patient remains in sinus rhythm.  Continue beta-blocker and Coumadin.  I have provided the name of apixaban and he will check price with his insurance and we will  switch if affordable.  3 hypertension-blood pressure controlled.  Continue present medical regimen and follow.  4 hyperlipidemia-given documented coronary disease I will increase Lipitor to 80 mg daily.  Check lipids and liver in 12 weeks.  5 previous cryptogenic stroke  Kirk Ruths, MD

## 2020-08-04 ENCOUNTER — Ambulatory Visit (INDEPENDENT_AMBULATORY_CARE_PROVIDER_SITE_OTHER): Payer: Medicare HMO

## 2020-08-04 ENCOUNTER — Encounter: Payer: Self-pay | Admitting: Cardiology

## 2020-08-04 ENCOUNTER — Other Ambulatory Visit: Payer: Self-pay

## 2020-08-04 ENCOUNTER — Ambulatory Visit: Payer: Medicare HMO | Admitting: Cardiology

## 2020-08-04 VITALS — BP 140/83 | HR 79 | Temp 97.3°F | Ht 68.0 in | Wt 176.2 lb

## 2020-08-04 DIAGNOSIS — E785 Hyperlipidemia, unspecified: Secondary | ICD-10-CM

## 2020-08-04 DIAGNOSIS — I2510 Atherosclerotic heart disease of native coronary artery without angina pectoris: Secondary | ICD-10-CM

## 2020-08-04 DIAGNOSIS — I480 Paroxysmal atrial fibrillation: Secondary | ICD-10-CM

## 2020-08-04 DIAGNOSIS — Z7901 Long term (current) use of anticoagulants: Secondary | ICD-10-CM | POA: Diagnosis not present

## 2020-08-04 DIAGNOSIS — I10 Essential (primary) hypertension: Secondary | ICD-10-CM

## 2020-08-04 DIAGNOSIS — I6310 Cerebral infarction due to embolism of unspecified precerebral artery: Secondary | ICD-10-CM | POA: Diagnosis not present

## 2020-08-04 LAB — POCT INR: INR: 3.3 — AB (ref 2.0–3.0)

## 2020-08-04 MED ORDER — ATORVASTATIN CALCIUM 80 MG PO TABS: 80.0000 mg | ORAL_TABLET | Freq: Every day | ORAL | 3 refills | Status: DC

## 2020-08-04 NOTE — Patient Instructions (Signed)
Medication Instructions:   INCREASE ATORVASTATIN TO 80 MG ONCE DAILY= 4 OF THE 20 MG TABLETS ONCE DAILY  *If you need a refill on your cardiac medications before your next appointment, please call your pharmacy*   Lab Work:  Your physician recommends that you return for lab work in: [place]  If you have labs (blood work) drawn today and your tests are completely normal, you will receive your results only by: Marland Kitchen MyChart Message (if you have MyChart) OR . A paper copy in the mail If you have any lab test that is abnormal or we need to change your treatment, we will call you to review the results   Follow-Up: At [facility], you and your health needs are our priority.  As part of our continuing mission to provide you with exceptional heart care, we have created designated Provider Care Teams.  These Care Teams include your primary Cardiologist (physician) and Advanced Practice Providers (APPs -  Physician Assistants and Nurse Practitioners) who all work together to provide you with the care you need, when you need it.  We recommend signing up for the patient portal called "MyChart".  Sign up information is provided on this After Visit Summary.  MyChart is used to connect with patients for Virtual Visits (Telemedicine).  Patients are able to view lab/test results, encounter notes, upcoming appointments, etc.  Non-urgent messages can be sent to your provider as well.   To learn more about what you can do with MyChart, go to [email or web address].    Your next appointment:   12 month(s)  The format for your next appointment:   In Person  Provider:   Kirk Ruths, MD

## 2020-08-04 NOTE — Patient Instructions (Signed)
Hold tomorrow, pt already took dose this morning and then Continue taking 1 tablet daily except 1.5 tablets each Wednesday.  Repeat INR in 6 weeks.

## 2020-08-07 ENCOUNTER — Other Ambulatory Visit: Payer: Self-pay | Admitting: Cardiology

## 2020-08-11 ENCOUNTER — Telehealth: Payer: Self-pay | Admitting: Cardiology

## 2020-08-11 NOTE — Telephone Encounter (Signed)
Pt c/o medication issue:  1. Name of Medication: atorvastatin (LIPITOR) 80 MG tablet  2. How are you currently taking this medication (dosage and times per day)? 3 tablets (20 mg total) by mouth daily  3. Are you having a reaction (difficulty breathing--STAT)? Yes   4. What is your medication issue? Patient states medication increase has caused jitteriness. He states on 08/04/20 he began taking 40 MG daily in order to work himself up to the 80 MG. Per patient, today he increased to 60 MG and he has been extremely jittery. He states he did not have any additional symptoms and he is unsure why medication was initially increased (20 MG to 80 MG) as he assumes his cholesterol levels have been fine. He states he will continue taking the 40 MG daily to prevent jitteriness and he would like a call back to discuss.

## 2020-08-11 NOTE — Telephone Encounter (Signed)
Okay to continue Lipitor 80 mg daily.  Check lipids and liver in 12 weeks. Kirk Ruths

## 2020-08-11 NOTE — Telephone Encounter (Signed)
Patient to continue 40 mg daily of atorvastatin per dr Stanford Breed

## 2020-08-11 NOTE — Telephone Encounter (Signed)
Called left message for patient to return call   per  Last office visit  08/04/20 With Dr Stanford Breed - increase Atorvastatin to 80 mg daily from 20 mg .  Patient diagnosis of CAD   Defer to Dr Stanford Breed

## 2020-08-14 NOTE — Telephone Encounter (Signed)
Left message for patient to continue on the 40 mg of atorvastatin if tolerating it okay. He was reminded to get labs in 12 weeks as before. He is to call with questions.

## 2020-08-18 ENCOUNTER — Telehealth: Payer: Self-pay | Admitting: Internal Medicine

## 2020-08-18 ENCOUNTER — Other Ambulatory Visit: Payer: Self-pay | Admitting: Internal Medicine

## 2020-08-18 DIAGNOSIS — I779 Disorder of arteries and arterioles, unspecified: Secondary | ICD-10-CM

## 2020-08-18 DIAGNOSIS — N521 Erectile dysfunction due to diseases classified elsewhere: Secondary | ICD-10-CM

## 2020-08-18 MED ORDER — TADALAFIL 10 MG PO TABS: ORAL_TABLET | ORAL | 3 refills | Status: DC

## 2020-08-18 NOTE — Telephone Encounter (Signed)
   Patient calling to request order for Cialis 10mg .  Advised patient last office note requested him to follow up in August. Patient state there is no need for appointment Patient adamantly declined appointment.

## 2020-09-02 ENCOUNTER — Telehealth: Payer: Self-pay | Admitting: Internal Medicine

## 2020-09-02 NOTE — Progress Notes (Signed)
  Chronic Care Management   Outreach Note  09/02/2020 Name: [name] MRN: [id number] DOB: Sep 18, 1946  Referred by: Janith Lima, MD Reason for referral : No chief complaint on file.   Third unsuccessful telephone outreach was attempted today. The patient was referred to the pharmacist for assistance with care management and care coordination.   Follow Up Plan:   Carley Perdue [employer] Scheduler

## 2020-09-15 ENCOUNTER — Ambulatory Visit (INDEPENDENT_AMBULATORY_CARE_PROVIDER_SITE_OTHER): Payer: Medicare HMO

## 2020-09-15 ENCOUNTER — Other Ambulatory Visit: Payer: Self-pay

## 2020-09-15 DIAGNOSIS — I6310 Cerebral infarction due to embolism of unspecified precerebral artery: Secondary | ICD-10-CM | POA: Diagnosis not present

## 2020-09-15 DIAGNOSIS — I480 Paroxysmal atrial fibrillation: Secondary | ICD-10-CM

## 2020-09-15 DIAGNOSIS — Z7901 Long term (current) use of anticoagulants: Secondary | ICD-10-CM | POA: Diagnosis not present

## 2020-09-15 LAB — POCT INR: INR: 3.5 — AB (ref 2.0–3.0)

## 2020-09-15 NOTE — Patient Instructions (Signed)
Hold tomorrow, pt already took dose this morning and then Continue taking 1 tablet daily except 1.5 tablets each Wednesday.  Repeat INR in 6 weeks. Be consistent with alcohol.

## 2020-09-22 ENCOUNTER — Other Ambulatory Visit: Payer: Self-pay | Admitting: Cardiology

## 2020-09-22 DIAGNOSIS — E785 Hyperlipidemia, unspecified: Secondary | ICD-10-CM

## 2020-09-26 DIAGNOSIS — Z20822 Contact with and (suspected) exposure to covid-19: Secondary | ICD-10-CM | POA: Diagnosis not present

## 2020-10-23 ENCOUNTER — Other Ambulatory Visit: Payer: Self-pay | Admitting: Cardiology

## 2020-10-23 DIAGNOSIS — I480 Paroxysmal atrial fibrillation: Secondary | ICD-10-CM

## 2020-10-23 DIAGNOSIS — I10 Essential (primary) hypertension: Secondary | ICD-10-CM

## 2020-10-29 ENCOUNTER — Other Ambulatory Visit: Payer: Self-pay

## 2020-10-29 ENCOUNTER — Ambulatory Visit (INDEPENDENT_AMBULATORY_CARE_PROVIDER_SITE_OTHER): Payer: Medicare HMO

## 2020-10-29 DIAGNOSIS — Z7901 Long term (current) use of anticoagulants: Secondary | ICD-10-CM | POA: Diagnosis not present

## 2020-10-29 DIAGNOSIS — I480 Paroxysmal atrial fibrillation: Secondary | ICD-10-CM

## 2020-10-29 DIAGNOSIS — I6310 Cerebral infarction due to embolism of unspecified precerebral artery: Secondary | ICD-10-CM | POA: Diagnosis not present

## 2020-10-29 LAB — POCT INR: INR: 3.8 — AB (ref 2.0–3.0)

## 2020-10-29 NOTE — Patient Instructions (Signed)
Hold tomorrow and then Continue taking 1 tablet daily except 1.5 tablets each Wednesday.  Repeat INR in 6 weeks. Be consistent with alcohol.

## 2020-11-14 ENCOUNTER — Other Ambulatory Visit: Payer: Self-pay | Admitting: Cardiology

## 2020-11-14 ENCOUNTER — Other Ambulatory Visit: Payer: Self-pay | Admitting: Internal Medicine

## 2020-11-14 DIAGNOSIS — I10 Essential (primary) hypertension: Secondary | ICD-10-CM

## 2020-12-01 ENCOUNTER — Encounter: Payer: Self-pay | Admitting: *Deleted

## 2020-12-10 ENCOUNTER — Other Ambulatory Visit: Payer: Self-pay

## 2020-12-10 ENCOUNTER — Ambulatory Visit (INDEPENDENT_AMBULATORY_CARE_PROVIDER_SITE_OTHER): Payer: Medicare HMO

## 2020-12-10 DIAGNOSIS — Z7901 Long term (current) use of anticoagulants: Secondary | ICD-10-CM

## 2020-12-10 DIAGNOSIS — I480 Paroxysmal atrial fibrillation: Secondary | ICD-10-CM

## 2020-12-10 DIAGNOSIS — I6310 Cerebral infarction due to embolism of unspecified precerebral artery: Secondary | ICD-10-CM | POA: Diagnosis not present

## 2020-12-10 LAB — POCT INR: INR: 3.4 — AB (ref 2.0–3.0)

## 2020-12-10 NOTE — Patient Instructions (Signed)
Hold tomorrow and then decrease to 1 tablet daily.  Repeat INR in 6 weeks.

## 2021-01-07 ENCOUNTER — Telehealth: Payer: Self-pay | Admitting: Internal Medicine

## 2021-01-07 NOTE — Telephone Encounter (Signed)
LVM for pt to rtn my call to schedule AWV with [facility]. Please schedule AWV if pt calls the office  

## 2021-01-21 ENCOUNTER — Other Ambulatory Visit: Payer: Self-pay

## 2021-01-21 ENCOUNTER — Ambulatory Visit (INDEPENDENT_AMBULATORY_CARE_PROVIDER_SITE_OTHER): Payer: Medicare HMO

## 2021-01-21 DIAGNOSIS — I6310 Cerebral infarction due to embolism of unspecified precerebral artery: Secondary | ICD-10-CM

## 2021-01-21 DIAGNOSIS — I480 Paroxysmal atrial fibrillation: Secondary | ICD-10-CM

## 2021-01-21 DIAGNOSIS — Z7901 Long term (current) use of anticoagulants: Secondary | ICD-10-CM | POA: Diagnosis not present

## 2021-01-21 LAB — POCT INR: INR: 2.2 (ref 2.0–3.0)

## 2021-01-21 NOTE — Patient Instructions (Signed)
Continue 1 tablet daily.  Repeat INR in 6 weeks.

## 2021-02-17 ENCOUNTER — Telehealth: Payer: Self-pay | Admitting: Internal Medicine

## 2021-02-17 NOTE — Progress Notes (Signed)
  Chronic Care Management   Note  02/17/2021 Name: SLAYDEN MENNENGA MRN: [id number] DOB: 11/05/1945  JURON VORHEES is a 75 y.o. year old male who is a primary care patient of Janith Lima, MD. I reached out to Georgette Dover by phone today in response to a referral sent by Mr. Damany Eastman Bockrath's PCP, Janith Lima, MD.   Mr. Pokorny was given information about Chronic Care Management services today including:  1. CCM service includes personalized support from designated clinical staff supervised by his physician, including individualized plan of care and coordination with other care providers 2. 24/7 contact phone numbers for assistance for urgent and routine care needs. 3. Service will only be billed when office clinical staff spend 20 minutes or more in a month to coordinate care. 4. Only one practitioner may furnish and bill the service in a calendar month. 5. The patient may stop CCM services at any time (effective at the end of the month) by phone call to the office staff.   Patient wishes to consider information provided and/or speak with a member of the care team before deciding about enrollment in care management services.   Follow up plan:   [place]

## 2021-03-04 ENCOUNTER — Ambulatory Visit (INDEPENDENT_AMBULATORY_CARE_PROVIDER_SITE_OTHER): Payer: Medicare HMO

## 2021-03-04 ENCOUNTER — Other Ambulatory Visit: Payer: Self-pay

## 2021-03-04 DIAGNOSIS — I480 Paroxysmal atrial fibrillation: Secondary | ICD-10-CM

## 2021-03-04 DIAGNOSIS — Z7901 Long term (current) use of anticoagulants: Secondary | ICD-10-CM | POA: Diagnosis not present

## 2021-03-04 DIAGNOSIS — I6310 Cerebral infarction due to embolism of unspecified precerebral artery: Secondary | ICD-10-CM | POA: Diagnosis not present

## 2021-03-04 LAB — POCT INR: INR: 2.8 (ref 2.0–3.0)

## 2021-03-04 NOTE — Patient Instructions (Signed)
Continue 1 tablet daily.  Repeat INR in 8 weeks.

## 2021-03-16 ENCOUNTER — Other Ambulatory Visit: Payer: Self-pay | Admitting: Cardiology

## 2021-03-16 DIAGNOSIS — E785 Hyperlipidemia, unspecified: Secondary | ICD-10-CM

## 2021-04-15 ENCOUNTER — Other Ambulatory Visit: Payer: Self-pay | Admitting: Cardiology

## 2021-04-29 ENCOUNTER — Other Ambulatory Visit: Payer: Self-pay

## 2021-04-29 ENCOUNTER — Ambulatory Visit (INDEPENDENT_AMBULATORY_CARE_PROVIDER_SITE_OTHER): Payer: Medicare HMO

## 2021-04-29 DIAGNOSIS — I6310 Cerebral infarction due to embolism of unspecified precerebral artery: Secondary | ICD-10-CM | POA: Diagnosis not present

## 2021-04-29 DIAGNOSIS — I480 Paroxysmal atrial fibrillation: Secondary | ICD-10-CM | POA: Diagnosis not present

## 2021-04-29 DIAGNOSIS — Z7901 Long term (current) use of anticoagulants: Secondary | ICD-10-CM

## 2021-04-29 LAB — POCT INR: INR: 2.6 (ref 2.0–3.0)

## 2021-04-29 NOTE — Patient Instructions (Signed)
Continue 1 tablet daily.  Repeat INR in 8 weeks.

## 2021-05-17 ENCOUNTER — Encounter: Payer: Self-pay | Admitting: Gastroenterology

## 2021-06-11 ENCOUNTER — Other Ambulatory Visit: Payer: Self-pay

## 2021-06-11 ENCOUNTER — Encounter: Payer: Self-pay | Admitting: Internal Medicine

## 2021-06-11 ENCOUNTER — Ambulatory Visit (INDEPENDENT_AMBULATORY_CARE_PROVIDER_SITE_OTHER): Payer: Medicare HMO | Admitting: Internal Medicine

## 2021-06-11 VITALS — BP 180/92 | HR 90 | Temp 98.0°F | Resp 16 | Ht 68.0 in | Wt 184.0 lb

## 2021-06-11 DIAGNOSIS — N400 Enlarged prostate without lower urinary tract symptoms: Secondary | ICD-10-CM

## 2021-06-11 DIAGNOSIS — Z0001 Encounter for general adult medical examination with abnormal findings: Secondary | ICD-10-CM | POA: Diagnosis not present

## 2021-06-11 DIAGNOSIS — E785 Hyperlipidemia, unspecified: Secondary | ICD-10-CM | POA: Diagnosis not present

## 2021-06-11 DIAGNOSIS — F10188 Alcohol abuse with other alcohol-induced disorder: Secondary | ICD-10-CM

## 2021-06-11 DIAGNOSIS — I10 Essential (primary) hypertension: Secondary | ICD-10-CM | POA: Diagnosis not present

## 2021-06-11 DIAGNOSIS — I480 Paroxysmal atrial fibrillation: Secondary | ICD-10-CM

## 2021-06-11 DIAGNOSIS — Z23 Encounter for immunization: Secondary | ICD-10-CM

## 2021-06-11 LAB — CBC WITH DIFFERENTIAL/PLATELET
Basophils Absolute: 0 10*3/uL (ref 0.0–0.1)
Basophils Relative: 0.8 % (ref 0.0–3.0)
Eosinophils Absolute: 0.3 10*3/uL (ref 0.0–0.7)
Eosinophils Relative: 7.1 % — ABNORMAL HIGH (ref 0.0–5.0)
HCT: 43.3 % (ref 39.0–52.0)
Hemoglobin: 14.3 g/dL (ref 13.0–17.0)
Lymphocytes Relative: 30.6 % (ref 12.0–46.0)
Lymphs Abs: 1.4 10*3/uL (ref 0.7–4.0)
MCHC: 33.1 g/dL (ref 30.0–36.0)
MCV: 94.6 fl (ref 78.0–100.0)
Monocytes Absolute: 0.6 10*3/uL (ref 0.1–1.0)
Monocytes Relative: 13.1 % — ABNORMAL HIGH (ref 3.0–12.0)
Neutro Abs: 2.2 10*3/uL (ref 1.4–7.7)
Neutrophils Relative %: 48.4 % (ref 43.0–77.0)
Platelets: 151 10*3/uL (ref 150.0–400.0)
RBC: 4.58 Mil/uL (ref 4.22–5.81)
RDW: 13.1 % (ref 11.5–15.5)
WBC: 4.5 10*3/uL (ref 4.0–10.5)

## 2021-06-11 LAB — URINALYSIS, ROUTINE W REFLEX MICROSCOPIC
Bilirubin Urine: NEGATIVE
Ketones, ur: NEGATIVE
Leukocytes,Ua: NEGATIVE
Nitrite: NEGATIVE
Specific Gravity, Urine: 1.025 (ref 1.000–1.030)
Total Protein, Urine: NEGATIVE
Urine Glucose: NEGATIVE
Urobilinogen, UA: 0.2 (ref 0.0–1.0)
pH: 6 (ref 5.0–8.0)

## 2021-06-11 LAB — BASIC METABOLIC PANEL
BUN: 11 mg/dL (ref 6–23)
CO2: 30 mEq/L (ref 19–32)
Calcium: 8.9 mg/dL (ref 8.4–10.5)
Chloride: 104 mEq/L (ref 96–112)
Creatinine, Ser: 0.69 mg/dL (ref 0.40–1.50)
GFR: 91.46 mL/min (ref >60.00)
Glucose, Bld: 92 mg/dL (ref 70–99)
Potassium: 4.1 mEq/L (ref 3.5–5.1)
Sodium: 139 mEq/L (ref 135–145)

## 2021-06-11 LAB — HEPATIC FUNCTION PANEL
ALT: 23 U/L (ref 0–53)
AST: 29 U/L (ref 0–37)
Albumin: 4 g/dL (ref 3.5–5.2)
Alkaline Phosphatase: 56 U/L (ref 39–117)
Bilirubin, Direct: 0.1 mg/dL (ref 0.0–0.3)
Total Bilirubin: 0.7 mg/dL (ref 0.2–1.2)
Total Protein: 6.9 g/dL (ref 6.0–8.3)

## 2021-06-11 LAB — LIPID PANEL
Cholesterol: 118 mg/dL (ref 0–200)
HDL: 57.8 mg/dL (ref >39.00)
LDL Cholesterol: 51 mg/dL (ref 0–99)
NonHDL: 59.8
Total CHOL/HDL Ratio: 2
Triglycerides: 46 mg/dL (ref 0.0–149.0)
VLDL: 9.2 mg/dL (ref 0.0–40.0)

## 2021-06-11 LAB — TSH: TSH: 2.56 u[IU]/mL (ref 0.35–5.50)

## 2021-06-11 LAB — PSA: PSA: 2.9 ng/mL (ref 0.10–4.00)

## 2021-06-11 MED ORDER — BOOSTRIX 5-2.5-18.5 LF-MCG/0.5 IM SUSP: 0.5000 mL | Freq: Once | INTRAMUSCULAR | 0 refills | Status: AC

## 2021-06-11 MED ORDER — OLMESARTAN MEDOXOMIL-HCTZ 20-12.5 MG PO TABS
1.0000 | ORAL_TABLET | Freq: Every day | ORAL | 0 refills | Status: DC
Start: 2021-06-11 — End: 2021-08-27

## 2021-06-11 NOTE — Patient Instructions (Signed)

## 2021-06-11 NOTE — Progress Notes (Signed)
Subjective:  Patient ID: [name], male    DOB: November 10, 1945  Age: 75 y.o. MRN: [id number]  CC: Annual Exam, Hypertension, Hyperlipidemia, and Atrial Fibrillation  This visit occurred during the SARS-CoV-2 public health emergency.  Safety protocols were in place, including screening questions prior to the visit, additional usage of staff PPE, and extensive cleaning of exam room while observing appropriate contact time as indicated for disinfecting solutions.    HPI [name] presents for a CPX and f/up -   He has not been monitoring his blood pressure.  He denies headache, blurred vision, chest pain, shortness of breath, palpitations, dizziness, lightheadedness, or edema.  He continues to use cannabis and drinks at least 4 beers a day.  Outpatient Medications Prior to Visit  Medication Sig Dispense Refill   acetaminophen (TYLENOL) 325 MG tablet Take 2 tablets (650 mg total) by mouth every 4 (four) hours as needed for headache or mild pain.     atorvastatin (LIPITOR) 20 MG tablet TAKE 1 TABLET (20 MG TOTAL) BY MOUTH DAILY. NEEDS OV FOR REFILL 90 tablet 2   atorvastatin (LIPITOR) 80 MG tablet Take 1 tablet (80 mg total) by mouth daily. NEEDS OV FOR REFILL 90 tablet 3   metoprolol tartrate (LOPRESSOR) 25 MG tablet TAKE 1 AND 1/2 TABLETS BY MOUTH TWICE DAILY 270 tablet 3   NITROSTAT 0.4 MG SL tablet Place 1 tablet under the tongue as needed for chest pain.   2   tadalafil (CIALIS) 10 MG tablet TAKE 1 TABLET ORALLY 7 tablet 3   warfarin (COUMADIN) 5 MG tablet TAKE 1 TO 1 AND 1/2 TABLETS BY MOUTH DAILY AS DIRECTED BY COUMADIN CLINIC 120 tablet 1   No facility-administered medications prior to visit.    ROS Review of Systems  Musculoskeletal:  Positive for arthralgias and gait problem. Negative for myalgias.  Hematological:  Negative for adenopathy. Does not bruise/bleed easily.  Psychiatric/Behavioral: Negative.     Objective:  BP (!) 180/92 (BP Location: Left Arm, Patient  Position: Sitting, Cuff Size: Large)   Pulse 90   Temp 98 F (36.7 C) (Oral)   Resp 16   Ht '5\' 8"'$  (1.727 m)   Wt 184 lb (83.5 kg)   SpO2 98%   BMI 27.98 kg/m   BP Readings from Last 3 Encounters:  06/11/21 (!) 180/92  08/04/20 140/83  04/15/20 (!) 190/84    Wt Readings from Last 3 Encounters:  06/11/21 184 lb (83.5 kg)  08/04/20 176 lb 3.2 oz (79.9 kg)  04/15/20 184 lb (83.5 kg)    Physical Exam Vitals reviewed.  Constitutional:      Appearance: Normal appearance.  HENT:     Mouth/Throat:     Mouth: Mucous membranes are moist.     Comments: ++ fruity odor on his breath  Eyes:     General: No scleral icterus.    Conjunctiva/sclera: Conjunctivae normal.  Cardiovascular:     Rate and Rhythm: Regular rhythm. Bradycardia present.     Heart sounds: Normal heart sounds, S1 normal and S2 normal. No murmur heard.   No friction rub. No gallop.     Comments: EKG- Sinus bradycardia, 54 bpm Otherwise normal EKG Pulmonary:     Effort: Pulmonary effort is normal.     Breath sounds: No stridor. No wheezing, rhonchi or rales.  Abdominal:     General: Abdomen is flat.     Palpations: There is no mass.     Tenderness: There is no abdominal  tenderness. There is no guarding.     Hernia: No hernia is present. There is no hernia in the left inguinal area or right inguinal area.  Genitourinary:    Pubic Area: No rash.      Penis: Normal and circumcised.      Testes: Normal.     Epididymis:     Right: Normal.     Left: Normal.     Prostate: Enlarged. Not tender and no nodules present.     Rectum: Normal. Guaiac result negative. No mass, tenderness, anal fissure, external hemorrhoid or internal hemorrhoid. Normal anal tone.  Musculoskeletal:        General: Normal range of motion.     Cervical back: Neck supple.     Right lower leg: No edema.     Left lower leg: No edema.  Lymphadenopathy:     Cervical: No cervical adenopathy.     Lower Body: No right inguinal adenopathy. No  left inguinal adenopathy.  Skin:    General: Skin is warm and dry.  Neurological:     General: No focal deficit present.     Mental Status: He is alert.     Cranial Nerves: Cranial nerves are intact.     Coordination: Coordination is intact.  Psychiatric:        Attention and Perception: He is inattentive.        Mood and Affect: Mood is anxious.        Speech: Speech is delayed and tangential.        Behavior: Behavior normal. Behavior is cooperative.        Thought Content: Thought content normal. Thought content is not delusional. Thought content does not include homicidal or suicidal ideation. Thought content does not include homicidal plan.        Cognition and Memory: Cognition is impaired. Memory is impaired.    Lab Results  Component Value Date   WBC 4.5 06/11/2021   HGB 14.3 06/11/2021   HCT 43.3 06/11/2021   PLT 151.0 06/11/2021   GLUCOSE 92 06/11/2021   CHOL 118 06/11/2021   TRIG 46.0 06/11/2021   HDL 57.80 06/11/2021   LDLCALC 51 06/11/2021   ALT 23 06/11/2021   AST 29 06/11/2021   NA 139 06/11/2021   K 4.1 06/11/2021   CL 104 06/11/2021   CREATININE 0.69 06/11/2021   BUN 11 06/11/2021   CO2 30 06/11/2021   TSH 2.56 06/11/2021   PSA 2.90 06/11/2021   INR 2.6 04/29/2021   HGBA1C 5.7 (H) 09/05/2014    No results found.  Assessment & Plan:   Gorge was seen today for annual exam, hypertension, hyperlipidemia and atrial fibrillation.  Diagnoses and all orders for this visit:  Primary hypertension- His blood pressure remains too high.  I will check labs to screen for secondary causes and endorgan damage.  I have asked him to add an ARB and thiazide diuretic to his current regimen. -     CBC with Differential/Platelet; Future -     Basic metabolic panel; Future -     TSH; Future -     Aldosterone + renin activity w/ ratio; Future -     EKG 12-Lead -     Aldosterone + renin activity w/ ratio -     TSH -     Basic metabolic panel -     CBC with  Differential/Platelet -     olmesartan-hydrochlorothiazide (BENICAR HCT) 20-12.5 MG tablet; Take 1 tablet by mouth daily.  PAF- CHADVASC -5- He has good rate and rhythm controlled and is anticoagulated with Coumadin. -     TSH; Future -     TSH  Benign prostatic hyperplasia without lower urinary tract symptoms- His PSA is reassuring. -     PSA; Future -     Urinalysis, Routine w reflex microscopic; Future -     Urinalysis, Routine w reflex microscopic -     PSA  Hyperlipidemia with target LDL less than 100- LDL goal achieved. Doing well on the statin  -     Lipid panel; Future -     TSH; Future -     Hepatic function panel; Future -     Hepatic function panel -     TSH -     Lipid panel  Encounter for general adult medical examination with abnormal findings- Exam completed, labs reviewed, vaccines reviewed and updated, cancer screenings are up-to-date, patient education was given.  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 -     thiamine (VITAMIN B-1) 50 MG tablet; Take 1 tablet (50 mg total) by mouth daily.  Need for prophylactic vaccination with combined diphtheria-tetanus-pertussis (DTP) vaccine -     Tdap (BOOSTRIX) 5-2.5-18.5 LF-MCG/0.5 injection; Inject 0.5 mLs into the muscle once for 1 dose.  I am having Shraga C. Boullion start on Boostrix, olmesartan-hydrochlorothiazide, and thiamine. I am also having him maintain his acetaminophen, Nitrostat, atorvastatin, tadalafil, metoprolol tartrate, atorvastatin, and warfarin.  Meds ordered this encounter  Medications   Tdap (BOOSTRIX) 5-2.5-18.5 LF-MCG/0.5 injection    Sig: Inject 0.5 mLs into the muscle once for 1 dose.    Dispense:  0.5 mL    Refill:  0   olmesartan-hydrochlorothiazide (BENICAR HCT) 20-12.5 MG tablet    Sig: Take 1 tablet by mouth daily.     Dispense:  90 tablet    Refill:  0   thiamine (VITAMIN B-1) 50 MG tablet    Sig: Take 1 tablet (50 mg total) by mouth daily.    Dispense:  90 tablet    Refill:  1      Follow-up: Return in about 6 weeks (around 07/23/2021).  Scarlette Calico, MD

## 2021-06-12 MED ORDER — VITAMIN B-1 50 MG PO TABS: 50.0000 mg | ORAL_TABLET | Freq: Every day | ORAL | 1 refills | Status: DC

## 2021-06-18 LAB — ALDOSTERONE + RENIN ACTIVITY W/ RATIO
ALDO / PRA Ratio: 4.9 Ratio (ref 0.9–28.9)
Aldosterone: 2 ng/dL
Renin Activity: 0.41 ng/mL/h (ref 0.25–5.82)

## 2021-06-24 ENCOUNTER — Ambulatory Visit (INDEPENDENT_AMBULATORY_CARE_PROVIDER_SITE_OTHER): Payer: Medicare HMO

## 2021-06-24 ENCOUNTER — Other Ambulatory Visit: Payer: Self-pay

## 2021-06-24 DIAGNOSIS — Z7901 Long term (current) use of anticoagulants: Secondary | ICD-10-CM | POA: Diagnosis not present

## 2021-06-24 DIAGNOSIS — I6310 Cerebral infarction due to embolism of unspecified precerebral artery: Secondary | ICD-10-CM | POA: Diagnosis not present

## 2021-06-24 DIAGNOSIS — I480 Paroxysmal atrial fibrillation: Secondary | ICD-10-CM

## 2021-06-24 LAB — POCT INR: INR: 3.2 — AB (ref 2.0–3.0)

## 2021-06-24 NOTE — Patient Instructions (Signed)
Continue 1 tablet daily.  Repeat INR in 8 weeks. Eat greens tonight.

## 2021-07-01 ENCOUNTER — Telehealth: Payer: Self-pay | Admitting: Internal Medicine

## 2021-07-01 NOTE — Telephone Encounter (Signed)
Labs have been printed and mailed to address on file.

## 2021-07-01 NOTE — Telephone Encounter (Signed)
Patient would like all results & labs from previous visit mailed to address on file

## 2021-07-23 ENCOUNTER — Ambulatory Visit: Payer: Medicare HMO | Admitting: Internal Medicine

## 2021-08-07 ENCOUNTER — Telehealth: Payer: Self-pay | Admitting: Internal Medicine

## 2021-08-07 NOTE — Telephone Encounter (Signed)
Pt. Calls and states he has a problem with R knee. States Dr. Ronnald Ramp referred him to Dr. Hulan Saas. Provider recommended that he try a leg brace, did not help.    Wants provider to recommend to another provider to see. States that knee continues to pop. Wants a specialist that can diagnose what problem is.    Callback #- [phone]

## 2021-08-10 ENCOUNTER — Other Ambulatory Visit: Payer: Self-pay | Admitting: Internal Medicine

## 2021-08-10 DIAGNOSIS — G8929 Other chronic pain: Secondary | ICD-10-CM | POA: Insufficient documentation

## 2021-08-10 DIAGNOSIS — M25561 Pain in right knee: Secondary | ICD-10-CM | POA: Insufficient documentation

## 2021-08-19 ENCOUNTER — Other Ambulatory Visit: Payer: Self-pay

## 2021-08-19 ENCOUNTER — Ambulatory Visit (INDEPENDENT_AMBULATORY_CARE_PROVIDER_SITE_OTHER): Payer: Medicare HMO

## 2021-08-19 DIAGNOSIS — I6310 Cerebral infarction due to embolism of unspecified precerebral artery: Secondary | ICD-10-CM

## 2021-08-19 DIAGNOSIS — Z7901 Long term (current) use of anticoagulants: Secondary | ICD-10-CM

## 2021-08-19 DIAGNOSIS — I480 Paroxysmal atrial fibrillation: Secondary | ICD-10-CM | POA: Diagnosis not present

## 2021-08-19 LAB — POCT INR: INR: 2.7 (ref 2.0–3.0)

## 2021-08-19 NOTE — Patient Instructions (Signed)
Continue 1 tablet daily.  Repeat INR in 8 weeks.

## 2021-08-25 ENCOUNTER — Ambulatory Visit (HOSPITAL_BASED_OUTPATIENT_CLINIC_OR_DEPARTMENT_OTHER): Payer: Medicare HMO | Admitting: Orthopaedic Surgery

## 2021-08-25 ENCOUNTER — Telehealth: Payer: Self-pay | Admitting: *Deleted

## 2021-08-25 NOTE — Telephone Encounter (Signed)
Left message for pt, handicap plaque signed and placed in the mail to his home address.

## 2021-08-27 ENCOUNTER — Encounter: Payer: Self-pay | Admitting: Gastroenterology

## 2021-08-27 ENCOUNTER — Ambulatory Visit: Payer: Medicare HMO | Admitting: Gastroenterology

## 2021-08-27 ENCOUNTER — Telehealth: Payer: Self-pay | Admitting: *Deleted

## 2021-08-27 VITALS — BP 132/84 | HR 60 | Ht 68.0 in | Wt 186.0 lb

## 2021-08-27 DIAGNOSIS — Z86010 Personal history of colonic polyps: Secondary | ICD-10-CM | POA: Diagnosis not present

## 2021-08-27 DIAGNOSIS — Z7901 Long term (current) use of anticoagulants: Secondary | ICD-10-CM | POA: Diagnosis not present

## 2021-08-27 MED ORDER — NA SULFATE-K SULFATE-MG SULF 17.5-3.13-1.6 GM/177ML PO SOLN: 1.0000 | Freq: Once | ORAL | 0 refills | Status: AC

## 2021-08-27 NOTE — Progress Notes (Signed)
Agree with assessment and plan as outlined.  

## 2021-08-27 NOTE — Patient Instructions (Signed)
You have been scheduled for a colonoscopy. Please follow written instructions given to you at your visit today.  Please pick up your prep supplies at the pharmacy within the next 1-3 days. If you use inhalers (even only as needed), please bring them with you on the day of your procedure.  If you are age 74 or older, your body mass index should be between 23-30. Your Body mass index is 28.28 kg/m. If this is out of the aforementioned range listed, please consider follow up with your Primary Care Provider.  If you are age 7 or younger, your body mass index should be between 19-25. Your Body mass index is 28.28 kg/m. If this is out of the aformentioned range listed, please consider follow up with your Primary Care Provider.   ________________________________________________________  The [facility] providers would like to encourage you to use [facility] to communicate with providers for non-urgent requests or questions.  Due to long hold times on the telephone, sending your provider a message by [facility] may be a faster and more efficient way to get a response.  Please allow 48 business hours for a response.  Please remember that this is for non-urgent requests.  _______________________________________________________

## 2021-08-27 NOTE — Progress Notes (Addendum)
08/27/2021 [name] [id number] 29-Jan-1946   HISTORY OF PRESENT ILLNESS: This is a 75 year old male who is a patient of Dr. Doyne Keel.  He is here today to discuss surveillance colonoscopy as he had several small adenomatous colon polyps removed in March 2019 as listed below.  He has past medical history atrial fibrillation and remote history of CVA.  He was previously on Eliquis, but it became too expensive so he is now on Coumadin.  This is prescribed by his cardiologist, Dr. Stanford Breed.  He denies any GI complaints.  Moves his bowels well.  No rectal bleeding.  Colonoscopy 11/2017:  - Three 3 to 6 mm polyps in the cecum, removed with a cold snare. Resected and retrieved. - Three 4 to 5 mm polyps in the transverse colon, removed with a cold snare. Resected and retrieved. - One 3 mm polyp in the rectum, removed with a cold snare. Resected and retrieved. - Diverticulosis in the transverse colon and in the left colon. - Internal hemorrhoids. - The examination was otherwise normal.  Surgical [P], rectum, cecum, transverse, polyps (7): - TUBULAR ADENOMA(S) WITHOUT HIGH GRADE DYSPLASIA OR MALIGNANCY. - SESSILE SERRATED POLYP(S) WITHOUT CYTOLOGIC DYSPLASIA. - HYPERPLASTIC POLYP.   Past Medical History:  Diagnosis Date   Alcohol abuse, unspecified    Anginal pain ([place])    Arthritis    in back and shoulder   H/O renal calculi    History of kidney stones    Hyperlipidemia    preTx 114, postTx 47 LDL   Hypertension    Shortness of breath dyspnea    with exertion   Stroke [facility])    August '11 x 2: L. cerebellar, lateral medulla, ol rd lacunar infarcts;  2nd left pontine infarct.   Past Surgical History:  Procedure Laterality Date   BACK SURGERY  1986   CORONARY ARTERY BYPASS GRAFT N/A 09/06/2014   Procedure: CORONARY ARTERY BYPASS GRAFTING (CABG);  Surgeon: Ivin Poot, MD;  Location: [place];  Service: Open Heart Surgery;  Laterality: N/A;  CABG times 4, using internal  mammary artery, and right leg saphenous vein harvested endoscopically   LEFT HEART CATHETERIZATION WITH CORONARY ANGIOGRAM N/A 08/29/2014   Procedure: LEFT HEART CATHETERIZATION WITH CORONARY ANGIOGRAM;  Surgeon: Lorretta Harp, MD;  Location: [facility] CATH LAB;  Service: Cardiovascular;  Laterality: N/A;   LUMBAR DISC SURGERY  1986   Dr. Abbe Amsterdam Deaton   [place]  06/10/12   at [place]...left shoulder   TEE WITHOUT CARDIOVERSION N/A 09/06/2014   Procedure: TRANSESOPHAGEAL ECHOCARDIOGRAM (TEE);  Surgeon: Ivin Poot, MD;  Location: [place];  Service: Open Heart Surgery;  Laterality: N/A;   TONSILLECTOMY  1954   TONSILLECTOMY      reports that he quit smoking about 42 years ago. His smoking use included cigarettes. He has a 15.00 pack-year smoking history. He has never used smokeless tobacco. He reports current alcohol use of about 30.0 standard drinks per week. He reports current drug use. Drug: Marijuana. family history includes GI problems in his mother; Heart attack in his father; Heart disease in his father; Hypertension in his father. No Known Allergies    Outpatient Encounter Medications as of 08/27/2021  Medication Sig   atorvastatin (LIPITOR) 20 MG tablet TAKE 1 TABLET (20 MG TOTAL) BY MOUTH DAILY. NEEDS OV FOR REFILL   metoprolol tartrate (LOPRESSOR) 25 MG tablet TAKE 1 AND 1/2 TABLETS BY MOUTH TWICE DAILY   NITROSTAT 0.4 MG SL tablet Place  1 tablet under the tongue as needed for chest pain.    warfarin (COUMADIN) 5 MG tablet Take 5 mg by mouth daily.   [DISCONTINUED] acetaminophen (TYLENOL) 325 MG tablet Take 2 tablets (650 mg total) by mouth every 4 (four) hours as needed for headache or mild pain.   [DISCONTINUED] atorvastatin (LIPITOR) 80 MG tablet Take 1 tablet (80 mg total) by mouth daily. NEEDS OV FOR REFILL   [DISCONTINUED] olmesartan-hydrochlorothiazide (BENICAR HCT) 20-12.5 MG tablet Take 1 tablet by mouth daily.   [DISCONTINUED] tadalafil (CIALIS) 10 MG  tablet TAKE 1 TABLET ORALLY   [DISCONTINUED] thiamine (VITAMIN B-1) 50 MG tablet Take 1 tablet (50 mg total) by mouth daily.   [DISCONTINUED] warfarin (COUMADIN) 5 MG tablet TAKE 1 TO 1 AND 1/2 TABLETS BY MOUTH DAILY AS DIRECTED BY COUMADIN CLINIC   No facility-administered encounter medications on file as of 08/27/2021.    REVIEW OF SYSTEMS  : All other systems reviewed and negative except where noted in the History of Present Illness.   PHYSICAL EXAM: Ht 5\' 8"  (1.727 m)   Wt 186 lb (84.4 kg)   BMI 28.28 kg/m  General: Well developed white male in no acute distress Head: Normocephalic and atraumatic Eyes:  Sclerae anicteric. Ears: Normal auditory acuity Lungs: Clear throughout to auscultation; no W/R/R. Heart: Regular rate and rhythm; no M/R/G. Abdomen: Soft, non-distended.  BS present.  Non-tender. Rectal:  Will be done at the time of colonoscopy. Musculoskeletal: Symmetrical with no gross deformities  Skin: No lesions on visible extremities Extremities: No edema  Neurological: Alert oriented x 4, grossly non-focal Psychological:  Alert and cooperative. Normal mood and affect  ASSESSMENT AND PLAN: *Personal history of adenomatous colon polyps: Several small adenomatous polyps removed on colonoscopy March 5019.  3-year recall recommended.  We will schedule with Dr. Havery Moros. *Atrial fibrillation on chronic anticoagulation with coumadin:  Was previously on Eliquis but it was too expensive.  Will hold coumadin for 5 days prior to endoscopic procedures - will instruct when and how to resume after procedure. Benefits and risks of procedure explained including risks of bleeding, perforation, infection, missed lesions, reactions to medications and possible need for hospitalization and surgery for complications. Additional rare but real risk of stroke or other vascular clotting events off of coumadin also explained and need to seek urgent help if any signs of these problems occur. Will  communicate by phone or EMR with patient's prescribing provide, Dr. Stanford Breed to confirm that holding coumadin is reasonable in this case.    CC:  Janith Lima, MD

## 2021-08-27 NOTE — Telephone Encounter (Signed)
   Name: [name]  DOB: [birth year]  MRN: [id number]  Primary Cardiologist: Dr. Stanford Breed  Chart reviewed as part of pre-operative protocol coverage. Only Pharmacy clearance was requested but patient has not been seen in our office in over 1 year (last visit 08/04/2020). Therefore, he will require a follow-up visit in order to better assess preoperative cardiovascular risk.  Pre-op covering staff: - Please schedule appointment and call patient to inform them. If patient already had an upcoming appointment within acceptable timeframe, please add "pre-op clearance" to the appointment notes so provider is aware. - Please contact requesting surgeon's office via preferred method (i.e, phone, fax) to inform them of need for appointment prior to surgery.  If applicable, this message will also be routed to pharmacy pool and/or primary cardiologist for input on holding anticoagulant/antiplatelet agent as requested below so that this information is available to the clearing provider at time of patient's appointment. Pharmacy, can you please comment on how long Coumadin can be held for upcoming procedure?  Darreld Mclean, PA-C  08/27/2021, 6:41 PM

## 2021-08-27 NOTE — Telephone Encounter (Signed)
[facility] Pre-operative Risk Assessment     Request for surgical clearance:     Endoscopy Procedure  What type of surgery is being performed?     Colonoscopy  When is this surgery scheduled?     Monday 10/26/21  What type of clearance is required ?   Pharmacy  Are there any medications that need to be held prior to surgery and how long? Coumadin 5 days  Practice name and name of physician performing surgery?      [facility]  What is your office phone and fax number?      Phone- [phone]  [phone]  Anesthesia type (None, local, MAC, general) ?       MAC

## 2021-08-28 NOTE — Telephone Encounter (Signed)
Patient with diagnosis of afib on warfarin for anticoagulation.    Procedure: colonoscopy Date of procedure: 10/26/21  CHA2DS2-VASc Score = 5  This indicates a 7.2% annual risk of stroke. The patient's score is based upon: CHF History: 0 HTN History: 1 Diabetes History: 0 Stroke History: 2 Vascular Disease History: 1 Age Score: 1 Gender Score: 0   CrCl 51mL/min using adjusted body weight Platelet count 151K  Per office protocol, patient can hold warfarin for 5 days prior to procedure, however he will require periprocedural bridging with Lovenox due to hx of afib and stroke. He is followed at the [facility] who can coordinate. Looks like Dr Stanford Breed mentioned Eliquis to him at his last visit and pt was going to look into the cost. Eliquis is $45/month on his formulary. If pt preferred to change to Eliquis, he would just need to hold it for 1 day prior to procedure and could avoid Lovenox injections.

## 2021-08-28 NOTE — Telephone Encounter (Signed)
I s/w the pt and informed him that he is going to need an appt for pre op clearance. Pt said he just saw the PA and she went over everything and so why did he need an appt. I stated I did not see any appt with the cardiology office yesterday. Pt said no it was with GI. I explained to the pt that it is GI asking for the pre op clearance. I explained our pre op protocol to the pt and that we have not seen him since 07/2020 and that he would need an appt as it has been longer than 6 months since we last saw him. Pt has been scheduled to see Jory Sims, DNP 09/07/21 @ 8:50 at the [facility] office. I will update DNP of clearance notes for upcoming appt. Will send FYI to requesting office the pt has appt 09/07/21.

## 2021-08-28 NOTE — Telephone Encounter (Signed)
Pt has been scheduled earlier today for appt with Jory Sims, DNP 09/07/21.

## 2021-08-28 NOTE — Telephone Encounter (Signed)
Primary Cardiologist:None  Chart reviewed as part of pre-operative protocol coverage. Because of [name]'s past medical history and time since last visit, he/she will require a follow-up visit in order to better assess preoperative cardiovascular risk.  Pre-op covering staff: - Please schedule appointment and call patient to inform them. - Please contact requesting surgeon's office via preferred method (i.e, phone, fax) to inform them of need for appointment prior to surgery.  If applicable, this message will also be routed to pharmacy pool and/or primary cardiologist for input on holding anticoagulant/antiplatelet agent as requested below so that this information is available at time of patient's appointment.   Deberah Pelton, NP  08/28/2021, 1:02 PM

## 2021-09-02 NOTE — Telephone Encounter (Signed)
Patient called stating he has not received the form for the handicap plaque.

## 2021-09-02 NOTE — Telephone Encounter (Signed)
Left message for patient that or mail is slow and if he does not receive by the end of the week to let us know.

## 2021-09-04 NOTE — Progress Notes (Signed)
Cardiology Office Note   Date:  09/07/2021   ID:  [name], DOB [birth year], MRN [id number]  PCP:  Janith Lima, MD  Cardiologist: Dr. Stanford Breed CC: Pre-Operative Evaluation     History of Present Illness: [name] is a 75 y.o. male who presents for cardiology pre-operative evaluation for colonoscopy on Monday 10/26/2021. He is on coumadin and will need to be off of this for 5 days pre-operatively.   [name] has a history of hypertension, dyslipidemia, remote tobacco abuse, posterior circulation CVA, with severe three-vessel CAD and preserved LVEF at cath on 08/29/2014.  As result of this the patient had CABG x4 on 08/27/2014 with LIMA to LAD, SVG to ramus intermedius, SVG to the first obtuse \\marginal  and saphenous vein graft to the right coronary artery.  Was last seen in the office by Dr. Stanford Breed on 08/04/2020  Pharmacy has weighed in the patient was doing well and was asymptomatic.  He was reminded that he did not need to take aspirin as he was also on Coumadin.  Lipitor was also increased to 80 mg daily especially  with history of 5 previous cryptogenic strokes.  Blood pressure was well controlled.  "Per office protocol, patient can hold warfarin for 5 days prior to procedure, however he will require periprocedural bridging with Lovenox due to hx of afib and stroke. He is followed at the [facility] who can coordinate. Looks like Dr Stanford Breed mentioned Eliquis to him at his last visit and pt was going to look into the cost. Eliquis is $45/month on his formulary. If pt preferred to change to Eliquis, he would just need to hold it for 1 day prior to procedure and could avoid Lovenox injections".  He comes today without any complaints of chest pain, dyspnea on exertion, bleeding, syncope or near syncope.  He continues to have a right leg limp from his CVA but he does not use any ambulatory assist device with walking.  He has multiple questions, especially concerning  his cholesterol medication.  Labs are followed by his primary care physician, Dr. Ronnald Ramp.  LDL has been consistently in the 50s on atorvastatin 20 mg daily.  Past Medical History:  Diagnosis Date   Alcohol abuse, unspecified    Anginal pain ([place])    Arthritis    in back and shoulder   H/O renal calculi    History of kidney stones    Hyperlipidemia    preTx 114, postTx 47 LDL   Hypertension    Shortness of breath dyspnea    with exertion   Stroke [facility])    August '11 x 2: L. cerebellar, lateral medulla, ol rd lacunar infarcts;  2nd left pontine infarct.    Past Surgical History:  Procedure Laterality Date   BACK SURGERY  1986   CORONARY ARTERY BYPASS GRAFT N/A 09/06/2014   Procedure: CORONARY ARTERY BYPASS GRAFTING (CABG);  Surgeon: Ivin Poot, MD;  Location: [facility];  Service: Open Heart Surgery;  Laterality: N/A;  CABG times 4, using internal mammary artery, and right leg saphenous vein harvested endoscopically   LEFT HEART CATHETERIZATION WITH CORONARY ANGIOGRAM N/A 08/29/2014   Procedure: LEFT HEART CATHETERIZATION WITH CORONARY ANGIOGRAM;  Surgeon: Lorretta Harp, MD;  Location: [facility] CATH LAB;  Service: Cardiovascular;  Laterality: N/A;   LUMBAR DISC SURGERY  1986   Dr. Abbe Amsterdam Deaton   [place]  06/10/12   at [place]...left shoulder   TEE WITHOUT CARDIOVERSION N/A 09/06/2014  Procedure: TRANSESOPHAGEAL ECHOCARDIOGRAM (TEE);  Surgeon: Ivin Poot, MD;  Location: [facility];  Service: Open Heart Surgery;  Laterality: N/A;   TONSILLECTOMY  1954   TONSILLECTOMY       Current Outpatient Medications  Medication Sig Dispense Refill   atorvastatin (LIPITOR) 20 MG tablet TAKE 1 TABLET (20 MG TOTAL) BY MOUTH DAILY. NEEDS OV FOR REFILL 90 tablet 2   metoprolol tartrate (LOPRESSOR) 25 MG tablet TAKE 1 AND 1/2 TABLETS BY MOUTH TWICE DAILY 270 tablet 3   NITROSTAT 0.4 MG SL tablet Place 1 tablet under the tongue as needed for chest pain.   2   warfarin  (COUMADIN) 5 MG tablet Take 5 mg by mouth daily.     No current facility-administered medications for this visit.    Allergies:   Patient has no known allergies.    Social History:  The patient  reports that he quit smoking about 42 years ago. His smoking use included cigarettes. He has a 15.00 pack-year smoking history. He has never used smokeless tobacco. He reports current alcohol use of about 30.0 standard drinks per week. He reports current drug use. Drug: Marijuana.   Family History:  The patient's family history includes GI problems in his mother; Heart attack in his father; Heart disease in his father; Hypertension in his father.    ROS: All other systems are reviewed and negative. Unless otherwise mentioned in H&P    PHYSICAL EXAM: VS:  BP 136/72   Pulse (!) 54   Ht 6\' 1"  (1.854 m)   Wt 184 lb (83.5 kg)   SpO2 98%   BMI 24.28 kg/m  , BMI Body mass index is 24.28 kg/m. GEN: Well nourished, well developed, in no acute distress HEENT: normal, slight left-sided facial droop Neck: no JVD, carotid bruits, or masses Cardiac: RRR; no murmurs, rubs, or gallops,no edema  Respiratory:  Clear to auscultation bilaterally, normal work of breathing GI: soft, nontender, nondistended, + BS MS: no deformity or atrophy, does walk with a limp on the right. Skin: warm and dry, no rash Neuro:  Strength and sensation are intact Psych: euthymic mood, full affect   EKG:  EKG is ordered today. The ekg ordered today demonstrates personally reviewed-sinus bradycardia heart rate 56 bpm otherwise normal.   Recent Labs: 06/11/2021: ALT 23; BUN 11; Creatinine, Ser 0.69; Hemoglobin 14.3; Platelets 151.0; Potassium 4.1; Sodium 139; TSH 2.56    Lipid Panel    Component Value Date/Time   CHOL 118 06/11/2021 1001   TRIG 46.0 06/11/2021 1001   HDL 57.80 06/11/2021 1001   CHOLHDL 2 06/11/2021 1001   VLDL 9.2 06/11/2021 1001   LDLCALC 51 06/11/2021 1001   LDLCALC 51 04/15/2020 0940      Wt  Readings from Last 3 Encounters:  09/07/21 184 lb (83.5 kg)  08/27/21 186 lb (84.4 kg)  06/11/21 184 lb (83.5 kg)      Other studies Reviewed: Echocardiogram 09/28/2014 Left ventricle: The cavity size was normal. Wall thickness was    normal. Systolic function was normal. The estimated ejection    fraction was in the range of 50% to 55%. Wall motion was normal;    there were no regional wall motion abnormalities.  - Staged echo: Limited post-CPB exam: Good, vigorous LVEF. No    change post bypass in aortic valve function. No change post    bypass in mitral valve function.   CT of Head without contrast 06/05/2010 IMPRESSION:  1.  Both vertebral arteries are  widely patent in the neck, the left  is dominant.  See intracranial findings below.  2.  Left greater than right carotid atherosclerosis without  significant cervical ICA stenosis with respect to the distal  vessel.   CT of Head with contrast 06/05/3010  IMPRESSION:  1.  Intracranial vertebrobasilar system is patent.  There appear to  be diminutive but patent bilateral PICA vessels.  2.  Stable moderate left PCA P2 segment stenosis with preserved  distal flow.  3.  Mild ICA siphon atherosclerosis with no stenosis.  Stable  irregularity of the distal left MCA M1 segment.  4.  Acute brainstem and cerebellar infarcts seen on today's MRI are  not yet apparent on CT.    ASSESSMENT AND PLAN:  Preoperative evaluation: From a cardiac standpoint he is cleared to have colonoscopy without any further testing.  He is followed by Coumadin clinic who has left detailed instructions in writing with the patient along with verbal instructions concerning stopping Coumadin 5 days before the procedure and beginning Lovenox bridging.  He does have a follow-up appointment with Coumadin clinic on October 14, 2021 prior to having the colonoscopy on October 26, 2021 at which time he will have reinforcement of instructions.  2.   Coronary artery disease:  History of CABG x4 on 08/27/2014. Doing well without chest pain, DOE, or fatigue.  No changes in his current medication regimen.  Continue secondary management.  3.  History of CVA: Continues to have some residual slight left-sided facial droop, and writes leg limping.  This does not stop him from completing ADLs.  He continues on Coumadin therapy with instructions as above.  He will see Coumadin clinic prior to his colonoscopy on October 26, 2021.  4.  Hypercholesterolemia: I have reviewed his LDL as far back as a year ago.  I have explained to him that his LDL must be less than 70 with coronary artery disease, and new guidelines less than 50 with multiple cardiovascular risk factors.  He is currently at an LDL of 51.  I will continue his atorvastatin 20 mg daily.  He does not wish to take the high dose of atorvastatin 80 mg daily prescribed on last office visit with Dr. Stanford Breed.  He states that it gave him a lot of jitteriness.  He is due to follow-up with labs with PCP.  Would like to keep his LDL < 50.  May need to recommend increased dose of 40 mg of atorvastatin daily if this is not maintained.  Current medicines are reviewed at length with the patient today.  I have spent 30 minutes dedicated to the care of this patient on the date of this encounter to include pre-visit review of records, assessment, management and diagnostic testing,with shared decision making.  Labs/ tests ordered today include: None. Phill Myron. [place] Pugh, ANP, AACC   09/07/2021 11:35 AM    [facility] [place] [address] Office [phone] Fax [phone]  Notice: This dictation was prepared with Dragon dictation along with smaller phrase technology. Any transcriptional errors that result from this process are unintentional and may not be corrected upon review.

## 2021-09-07 ENCOUNTER — Other Ambulatory Visit: Payer: Self-pay

## 2021-09-07 ENCOUNTER — Ambulatory Visit: Payer: Medicare HMO | Admitting: Adult Health

## 2021-09-07 ENCOUNTER — Encounter: Payer: Self-pay | Admitting: Adult Health

## 2021-09-07 VITALS — BP 136/72 | HR 54 | Ht 73.0 in | Wt 184.0 lb

## 2021-09-07 DIAGNOSIS — I480 Paroxysmal atrial fibrillation: Secondary | ICD-10-CM | POA: Diagnosis not present

## 2021-09-07 DIAGNOSIS — I2510 Atherosclerotic heart disease of native coronary artery without angina pectoris: Secondary | ICD-10-CM

## 2021-09-07 DIAGNOSIS — I10 Essential (primary) hypertension: Secondary | ICD-10-CM | POA: Diagnosis not present

## 2021-09-07 DIAGNOSIS — Z951 Presence of aortocoronary bypass graft: Secondary | ICD-10-CM

## 2021-09-07 DIAGNOSIS — E7800 Pure hypercholesterolemia, unspecified: Secondary | ICD-10-CM | POA: Diagnosis not present

## 2021-09-07 NOTE — Patient Instructions (Signed)
Medication Instructions:  No Changes *If you need a refill on your cardiac medications before your next appointment, please call your pharmacy*   Lab Work: No Labs If you have labs (blood work) drawn today and your tests are completely normal, you will receive your results only by: . MyChart Message (if you have MyChart) OR . A paper copy in the mail If you have any lab test that is abnormal or we need to change your treatment, we will call you to review the results.   Testing/Procedures: No Testing   Follow-Up: At [facility], you and your health needs are our priority.  As part of our continuing mission to provide you with exceptional heart care, we have created designated Provider Care Teams.  These Care Teams include your primary Cardiologist (physician) and Advanced Practice Providers (APPs -  Physician Assistants and Nurse Practitioners) who all work together to provide you with the care you need, when you need it.  We recommend signing up for the patient portal called "MyChart".  Sign up information is provided on this After Visit Summary.  MyChart is used to connect with patients for Virtual Visits (Telemedicine).  Patients are able to view lab/test results, encounter notes, upcoming appointments, etc.  Non-urgent messages can be sent to your provider as well.   To learn more about what you can do with MyChart, go to [email or web address].    Your next appointment:   1 year(s)  The format for your next appointment:   In Person  Provider:   Brian Crenshaw, MD   

## 2021-09-11 ENCOUNTER — Other Ambulatory Visit: Payer: Self-pay | Admitting: Cardiology

## 2021-09-11 DIAGNOSIS — I10 Essential (primary) hypertension: Secondary | ICD-10-CM

## 2021-09-11 DIAGNOSIS — I480 Paroxysmal atrial fibrillation: Secondary | ICD-10-CM

## 2021-09-14 ENCOUNTER — Other Ambulatory Visit: Payer: Self-pay | Admitting: Cardiology

## 2021-09-22 NOTE — Telephone Encounter (Signed)
Did patient receive clearance at recent appointment? Please advise.

## 2021-09-23 NOTE — Telephone Encounter (Signed)
Per OV with Jory Sims, DNP on 09-07-21:   "Preoperative evaluation: From a cardiac standpoint he is cleared to have colonoscopy without any further testing.  He is followed by Coumadin clinic who has left detailed instructions in writing with the patient along with verbal instructions concerning stopping Coumadin 5 days before the procedure and beginning Lovenox bridging.  He does have a follow-up appointment with Coumadin clinic on October 14, 2021 prior to having the colonoscopy on October 26, 2021 at which time he will have reinforcement of instructions."

## 2021-10-14 ENCOUNTER — Other Ambulatory Visit: Payer: Self-pay

## 2021-10-14 ENCOUNTER — Ambulatory Visit (INDEPENDENT_AMBULATORY_CARE_PROVIDER_SITE_OTHER): Payer: Medicare HMO

## 2021-10-14 DIAGNOSIS — I480 Paroxysmal atrial fibrillation: Secondary | ICD-10-CM

## 2021-10-14 DIAGNOSIS — I6310 Cerebral infarction due to embolism of unspecified precerebral artery: Secondary | ICD-10-CM

## 2021-10-14 DIAGNOSIS — Z7901 Long term (current) use of anticoagulants: Secondary | ICD-10-CM | POA: Diagnosis not present

## 2021-10-14 LAB — POCT INR: INR: 2.6 (ref 2.0–3.0)

## 2021-10-14 MED ORDER — ENOXAPARIN SODIUM 80 MG/0.8ML IJ SOSY: 80.0000 mg | PREFILLED_SYRINGE | Freq: Two times a day (BID) | INTRAMUSCULAR | 1 refills | Status: DC

## 2021-10-14 NOTE — Patient Instructions (Signed)
Continue 1 tablet daily.  Repeat INR in 3 weeks. [phone];  Colonoscopy 1/30;  Lovenox Bridged;  1/24: Last dose of warfarin.  1/25: No warfarin or enoxaparin (Lovenox).  1/26: Inject enoxaparin 80 mg in the fatty abdominal tissue at least 2 inches from the belly button twice a day about 12 hours apart, 8am and 8pm rotate sites. No warfarin.  1/27: Inject enoxaparin in the fatty tissue every 12 hours, 8am and 8pm. No warfarin.  1/28: Inject enoxaparin in the fatty tissue every 12 hours, 8am and 8pm. No warfarin.  1/29: Inject enoxaparin in the fatty tissue in the morning at 8 am (No PM dose). No warfarin.  1/30: Procedure Day - No enoxaparin - Resume warfarin in the evening or as directed by doctor (take an extra half tablet with usual dose for 2 days then resume normal dose).  1/31: Resume enoxaparin inject in the fatty tissue every 12 hours and take warfarin  2/1: Inject enoxaparin in the fatty tissue every 12 hours and take warfarin  2/2: Inject enoxaparin in the fatty tissue every 12 hours and take warfarin  2/3: Inject enoxaparin in the fatty tissue every 12 hours and take warfarin  2/4: Inject enoxaparin in the fatty tissue every 12 hours and take warfarin  2/5: Inject enoxaparin in the fatty tissue every 12 hours and take warfarin.  2/6:  Check INR

## 2021-10-26 ENCOUNTER — Encounter: Payer: Self-pay | Admitting: Gastroenterology

## 2021-10-26 ENCOUNTER — Ambulatory Visit (AMBULATORY_SURGERY_CENTER): Payer: Medicare HMO | Admitting: Gastroenterology

## 2021-10-26 VITALS — BP 154/84 | HR 81 | Temp 98.4°F | Resp 12 | Ht 68.0 in | Wt 186.0 lb

## 2021-10-26 DIAGNOSIS — D120 Benign neoplasm of cecum: Secondary | ICD-10-CM | POA: Diagnosis not present

## 2021-10-26 DIAGNOSIS — D123 Benign neoplasm of transverse colon: Secondary | ICD-10-CM | POA: Diagnosis not present

## 2021-10-26 DIAGNOSIS — D124 Benign neoplasm of descending colon: Secondary | ICD-10-CM

## 2021-10-26 DIAGNOSIS — I2510 Atherosclerotic heart disease of native coronary artery without angina pectoris: Secondary | ICD-10-CM | POA: Diagnosis not present

## 2021-10-26 DIAGNOSIS — D128 Benign neoplasm of rectum: Secondary | ICD-10-CM | POA: Diagnosis not present

## 2021-10-26 DIAGNOSIS — I10 Essential (primary) hypertension: Secondary | ICD-10-CM | POA: Diagnosis not present

## 2021-10-26 DIAGNOSIS — Z86010 Personal history of colonic polyps: Secondary | ICD-10-CM | POA: Diagnosis not present

## 2021-10-26 DIAGNOSIS — D121 Benign neoplasm of appendix: Secondary | ICD-10-CM

## 2021-10-26 DIAGNOSIS — E669 Obesity, unspecified: Secondary | ICD-10-CM | POA: Diagnosis not present

## 2021-10-26 MED ORDER — SODIUM CHLORIDE 0.9 % IV SOLN: 500.0000 mL | Freq: Once | INTRAVENOUS | Status: DC

## 2021-10-26 NOTE — Patient Instructions (Addendum)
Resume Coumadin Tonight 10/26/21  Resume Lovenox tomorrow 10/27/21  Handout on polyps, Hemorrhoids,and diverticulosis provided.  Await pathology results.  YOU HAD AN ENDOSCOPIC PROCEDURE TODAY AT [facility]:   Refer to the procedure report that was given to you for any specific questions about what was found during the examination.  If the procedure report does not answer your questions, please call your gastroenterologist to clarify.  If you requested that your care partner not be given the details of your procedure findings, then the procedure report has been included in a sealed envelope for you to review at your convenience later.  YOU SHOULD EXPECT: Some feelings of bloating in the abdomen. Passage of more gas than usual.  Walking can help get rid of the air that was put into your GI tract during the procedure and reduce the bloating. If you had a lower endoscopy (such as a colonoscopy or flexible sigmoidoscopy) you may notice spotting of blood in your stool or on the toilet paper. If you underwent a bowel prep for your procedure, you may not have a normal bowel movement for a few days.  Please Note:  You might notice some irritation and congestion in your nose or some drainage.  This is from the oxygen used during your procedure.  There is no need for concern and it should clear up in a day or so.  SYMPTOMS TO REPORT IMMEDIATELY:  Following lower endoscopy (colonoscopy or flexible sigmoidoscopy):  Excessive amounts of blood in the stool  Significant tenderness or worsening of abdominal pains  Swelling of the abdomen that is new, acute  Fever of 100F or higher  For urgent or emergent issues, a gastroenterologist can be reached at any hour by calling [phone]. Do not use MyChart messaging for urgent concerns.    DIET:  We do recommend a small meal at first, but then you may proceed to your regular diet.  Drink plenty of fluids but you should avoid alcoholic  beverages for 24 hours.  ACTIVITY:  You should plan to take it easy for the rest of today and you should NOT DRIVE or use heavy machinery until tomorrow (because of the sedation medicines used during the test).    FOLLOW UP: Our staff will call the number listed on your records 48-72 hours following your procedure to check on you and address any questions or concerns that you may have regarding the information given to you following your procedure. If we do not reach you, we will leave a message.  We will attempt to reach you two times.  During this call, we will ask if you have developed any symptoms of COVID 19. If you develop any symptoms (ie: fever, flu-like symptoms, shortness of breath, cough etc.) before then, please call [phone].  If you test positive for Covid 19 in the 2 weeks post procedure, please call and report this information to [place].    If any biopsies were taken you will be contacted by phone or by letter within the next 1-3 weeks.  Please call us at [phone] if you have not heard about the biopsies in 3 weeks.    SIGNATURES/CONFIDENTIALITY: You and/or your care partner have signed paperwork which will be entered into your electronic medical record.  These signatures attest to the fact that that the information above on your After Visit Summary has been reviewed and is understood.  Full responsibility of the confidentiality of this discharge information lies with you and/or your  care-partner.

## 2021-10-26 NOTE — Progress Notes (Signed)
[facility] History and Physical   Primary Care Physician:  Janith Lima, MD   Reason for Procedure:   History of colon polyps  Plan:    colonoscopy     HPI: [name] is a 76 y.o. male  here for colonoscopy surveillance - 6 SSPs / TAs removed 11/2017. Patient denies any bowel symptoms at this time. No family history of colon cancer known. Otherwise feels well without any cardiopulmonary symptoms. Last took coumadin 5 days ago, was placed on lovenox bridge and last dose yesterday AM. He denies any complaints today.   Past Medical History:  Diagnosis Date   Alcohol abuse, unspecified    Anginal pain ([place])    Arthritis    in back and shoulder   H/O renal calculi    History of kidney stones    Hyperlipidemia    preTx 114, postTx 47 LDL   Hypertension    Shortness of breath dyspnea    with exertion   Stroke [facility])    August '11 x 2: L. cerebellar, lateral medulla, ol rd lacunar infarcts;  2nd left pontine infarct.    Past Surgical History:  Procedure Laterality Date   BACK SURGERY  1986   CORONARY ARTERY BYPASS GRAFT N/A 09/06/2014   Procedure: CORONARY ARTERY BYPASS GRAFTING (CABG);  Surgeon: Ivin Poot, MD;  Location: [place] [facility];  Service: Open Heart Surgery;  Laterality: N/A;  CABG times 4, using internal mammary artery, and right leg saphenous vein harvested endoscopically   LEFT HEART CATHETERIZATION WITH CORONARY ANGIOGRAM N/A 08/29/2014   Procedure: LEFT HEART CATHETERIZATION WITH CORONARY ANGIOGRAM;  Surgeon: Lorretta Harp, MD;  Location: [facility] CATH LAB;  Service: Cardiovascular;  Laterality: N/A;   LUMBAR DISC SURGERY  1986   Dr. Abbe Amsterdam [place]   [place]  06/10/12   at [facility]...left shoulder   TEE WITHOUT CARDIOVERSION N/A 09/06/2014   Procedure: TRANSESOPHAGEAL ECHOCARDIOGRAM (TEE);  Surgeon: Ivin Poot, MD;  Location: [place];  Service: Open Heart Surgery;  Laterality: N/A;   TONSILLECTOMY  1954   TONSILLECTOMY       Prior to Admission medications   Medication Sig Start Date End Date Taking? Authorizing Provider  atorvastatin (LIPITOR) 20 MG tablet TAKE 1 TABLET (20 MG TOTAL) BY MOUTH DAILY. NEEDS OV FOR REFILL 03/16/21  Yes Buford Dresser, MD  enoxaparin (LOVENOX) 80 MG/0.8ML injection Inject 0.8 mLs (80 mg total) into the skin every 12 (twelve) hours. 10/14/21  Yes Lelon Perla, MD  metoprolol tartrate (LOPRESSOR) 25 MG tablet TAKE 1 AND 1/2 TABLETS BY MOUTH TWICE DAILY 09/14/21  Yes Crenshaw, Denice Bors, MD  NITROSTAT 0.4 MG SL tablet Place 1 tablet under the tongue as needed for chest pain.  08/29/14   [provider]  warfarin (COUMADIN) 5 MG tablet TAKE 1 TO 1 AND 1/2 TABLETS BY MOUTH DAILY AS DIRECTED BY COUMADIN CLINIC 09/14/21   Lelon Perla, MD    Current Outpatient Medications  Medication Sig Dispense Refill   atorvastatin (LIPITOR) 20 MG tablet TAKE 1 TABLET (20 MG TOTAL) BY MOUTH DAILY. NEEDS OV FOR REFILL 90 tablet 2   enoxaparin (LOVENOX) 80 MG/0.8ML injection Inject 0.8 mLs (80 mg total) into the skin every 12 (twelve) hours. 16 mL 1   metoprolol tartrate (LOPRESSOR) 25 MG tablet TAKE 1 AND 1/2 TABLETS BY MOUTH TWICE DAILY 270 tablet 3   NITROSTAT 0.4 MG SL tablet Place 1 tablet under the tongue as needed for chest pain.  2   warfarin (COUMADIN) 5 MG tablet TAKE 1 TO 1 AND 1/2 TABLETS BY MOUTH DAILY AS DIRECTED BY COUMADIN CLINIC 120 tablet 1   Current Facility-Administered Medications  Medication Dose Route Frequency Provider Last Rate Last Admin   0.9 %  sodium chloride infusion  500 mL Intravenous Once Phillp Dolores, Carlota Raspberry, MD        Allergies as of 10/26/2021   (No Known Allergies)    Family History  Problem Relation Age of Onset   GI problems Mother    Heart disease Father    Hypertension Father    Heart attack Father    Cancer Neg Hx    Diabetes Neg Hx    Early death Neg Hx    Hearing loss Neg Hx    Hyperlipidemia Neg Hx    Kidney disease Neg  Hx    Stroke Neg Hx    Alcohol abuse Neg Hx    Colon cancer Neg Hx    Stomach cancer Neg Hx     Social History   Socioeconomic History   Marital status: Married    Spouse name: Not on file   Number of children: 2   Years of education: 12   Highest education level: Not on file  Occupational History   Occupation: Administrator, arts - paint  Tobacco Use   Smoking status: Former    Packs/day: 1.00    Years: 15.00    Pack years: 15.00    Types: Cigarettes    Quit date: 09/27/1978    Years since quitting: 43.1   Smokeless tobacco: Never  Vaping Use   Vaping Use: Never used  Substance and Sexual Activity   Alcohol use: Yes    Alcohol/week: 30.0 standard drinks    Types: 30 Cans of beer per week   Drug use: Yes    Types: Marijuana    Comment: stopped a few days ago per patientr   Sexual activity: Yes    Partners: Female  Other Topics Concern   Not on file  Social History Narrative   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 Married '79. 1 dtr- '68, 1 step son. 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 Marriage in good health. He reports he is very strong in his faith.    Social Determinants of Health   Financial Resource Strain: Not on file  Food Insecurity: Not on file  Transportation Needs: Not on file  Physical Activity: Not on file  Stress: Not on file  Social Connections: Not on file  Intimate Partner Violence: Not on file    Review of Systems: All other review of systems negative except as mentioned in the HPI.  Physical Exam: Vital signs BP (!) 142/74    Pulse 69    Temp 98.4 F (36.9 C) (Temporal)    Resp 15    Ht 5\' 8"  (2.633 m)    Wt 186 lb (84.4 kg)    SpO2 98%    BMI 28.28 kg/m   General:   Alert,  Well-developed, pleasant and cooperative in NAD Lungs:  Clear throughout to auscultation.   Heart:  Regular rate and rhythm Abdomen:  Soft, nontender and nondistended.   Neuro/Psych:  Alert and  cooperative. Normal mood and affect. A and O x 3  Jolly Mango, MD [facility]

## 2021-10-26 NOTE — Progress Notes (Signed)
Called to room to assist during endoscopic procedure.  Patient ID and intended procedure confirmed with present staff. Received instructions for my participation in the procedure from the performing physician.  

## 2021-10-26 NOTE — Progress Notes (Signed)
Report to PACU, RN, vss, BBS= Clear.  

## 2021-10-26 NOTE — Op Note (Addendum)
[place] Patient Name: [name] Procedure Date: 10/26/2021 1:24 PM MRN: [id number] Endoscopist: Remo Lipps P. Havery Moros , MD Age: 76 Referring MD:  Date of Birth: 28-Aug-1946 Gender: Male Account #: [id number] Procedure:                Colonoscopy Indications:              High risk colon cancer surveillance: Personal                            history of colonic polyps - 6 adenomas / SSPs                            removed 11/2017, on Coumadin / Lovenox bridge Medicines:                Monitored Anesthesia Care Procedure:                Pre-Anesthesia Assessment:                           - Prior to the procedure, a History and Physical                            was performed, and patient medications and                            allergies were reviewed. The patient's tolerance of                            previous anesthesia was also reviewed. The risks                            and benefits of the procedure and the sedation                            options and risks were discussed with the patient.                            All questions were answered, and informed consent                            was obtained. Prior Anticoagulants: The patient has                            taken Coumadin (warfarin), last dose was 5 days                            prior to procedure, last dose of Lovenox yesterday.                            ASA Grade Assessment: III - A patient with severe                            systemic disease. After reviewing the risks and  benefits, the patient was deemed in satisfactory                            condition to undergo the procedure.                           After obtaining informed consent, the colonoscope                            was passed under direct vision. Throughout the                            procedure, the patient's blood pressure, pulse, and                            oxygen saturations were  monitored continuously. The                            CF [id number] #[id number] was introduced through the anus                            and advanced to the the cecum, identified by                            appendiceal orifice and ileocecal valve. The                            colonoscopy was performed without difficulty. The                            patient tolerated the procedure well. The quality                            of the bowel preparation was adequate. The                            ileocecal valve, appendiceal orifice, and rectum                            were photographed. Scope In: 1:28:57 PM Scope Out: 1:56:55 PM Scope Withdrawal Time: 0 hours 24 minutes 57 seconds  Total Procedure Duration: 0 hours 27 minutes 58 seconds  Findings:                 The perianal and digital rectal examinations were                            normal.                           The mucosa of the appendiceal orifice appeared                            abnormal, concerning for a flat polyp, which  appeared to be within the AO upon pleating it back                            with the forceps. Biopsies were taken with a cold                            forceps for histology.                           Two flat polyps were found in the cecum. The polyps                            were 2 to 4 mm in size. These polyps were removed                            with a cold snare. Resection and retrieval were                            complete.                           A 3 mm polyp was found in the transverse colon. The                            polyp was flat. The polyp was removed with a cold                            snare. Resection and retrieval were complete.                           A 4 mm polyp was found in the descending colon. The                            polyp was sessile. The polyp was removed with a                            cold snare. Resection and  retrieval were complete.                           Two sessile polyps were found in the rectum. The                            polyps were 2 to 4 mm in size. These polyps were                            removed with a cold snare. Resection and retrieval                            were complete.                           Multiple small-mouthed diverticula were found in  the left colon and right colon.                           Internal hemorrhoids were found during retroflexion.                           The exam was otherwise without abnormality. Several                            minutes spent lavaging the colon to achieve                            adequate views due to adherent liquid stool but                            prep was adequate. Complications:            No immediate complications. Estimated blood loss:                            Minimal. Estimated Blood Loss:     Estimated blood loss was minimal. Impression:               - Abnormal mucosa at the appendiceal orifice,                            concerning for flat polyp arising within the                            appendix. Biopsied.                           - Two 2 to 4 mm polyps in the cecum, removed with a                            cold snare. Resected and retrieved.                           - One 3 mm polyp in the transverse colon, removed                            with a cold snare. Resected and retrieved.                           - One 4 mm polyp in the descending colon, removed                            with a cold snare. Resected and retrieved.                           - Two 2 to 4 mm polyps in the rectum, removed with                            a cold snare. Resected and retrieved.                           -  Diverticulosis in the left colon and in the right                            colon.                           - Internal hemorrhoids.                           - The  examination was otherwise normal. Recommendation:           - Patient has a contact number available for                            emergencies. The signs and symptoms of potential                            delayed complications were discussed with the                            patient. Return to normal activities tomorrow.                            Written discharge instructions were provided to the                            patient.                           - Resume previous diet.                           - Continue present medications.                           - Resume coumadin tonight                           - Resume lovenox tomorrow                           - Await pathology results. Remo Lipps P. Bernese Doffing, MD 10/26/2021 2:04:28 PM This report has been signed electronically.

## 2021-10-28 ENCOUNTER — Telehealth: Payer: Self-pay

## 2021-10-28 NOTE — Telephone Encounter (Signed)
°  Follow up Call-  Call back number 10/26/2021  Post procedure Call Back phone  # [phone]  Permission to leave phone message Yes  Some recent data might be hidden     Left message

## 2021-10-28 NOTE — Telephone Encounter (Signed)
No answer, left message to call back later today, B.Amandine Covino RN. 

## 2021-11-02 ENCOUNTER — Other Ambulatory Visit: Payer: Self-pay

## 2021-11-02 ENCOUNTER — Ambulatory Visit (INDEPENDENT_AMBULATORY_CARE_PROVIDER_SITE_OTHER): Payer: Medicare HMO

## 2021-11-02 DIAGNOSIS — I6310 Cerebral infarction due to embolism of unspecified precerebral artery: Secondary | ICD-10-CM

## 2021-11-02 DIAGNOSIS — I480 Paroxysmal atrial fibrillation: Secondary | ICD-10-CM

## 2021-11-02 DIAGNOSIS — Z7901 Long term (current) use of anticoagulants: Secondary | ICD-10-CM

## 2021-11-02 LAB — POCT INR: INR: 1.9 — AB (ref 2.0–3.0)

## 2021-11-02 NOTE — Patient Instructions (Signed)
TAKE 1 MORE TABLET TODAY and then Continue 1 tablet daily.  Repeat INR in 6 weeks. [phone];

## 2021-12-11 ENCOUNTER — Other Ambulatory Visit: Payer: Self-pay | Admitting: Cardiology

## 2021-12-11 DIAGNOSIS — E785 Hyperlipidemia, unspecified: Secondary | ICD-10-CM

## 2021-12-14 ENCOUNTER — Telehealth: Payer: Self-pay

## 2021-12-14 NOTE — Telephone Encounter (Signed)
Lmom to r/s missed appt  

## 2021-12-16 ENCOUNTER — Other Ambulatory Visit: Payer: Self-pay

## 2021-12-16 ENCOUNTER — Ambulatory Visit (INDEPENDENT_AMBULATORY_CARE_PROVIDER_SITE_OTHER): Payer: Medicare HMO

## 2021-12-16 DIAGNOSIS — Z7901 Long term (current) use of anticoagulants: Secondary | ICD-10-CM

## 2021-12-16 DIAGNOSIS — I6310 Cerebral infarction due to embolism of unspecified precerebral artery: Secondary | ICD-10-CM

## 2021-12-16 DIAGNOSIS — I480 Paroxysmal atrial fibrillation: Secondary | ICD-10-CM | POA: Diagnosis not present

## 2021-12-16 LAB — POCT INR: INR: 2.2 (ref 2.0–3.0)

## 2021-12-16 NOTE — Patient Instructions (Signed)
Continue 1 tablet daily.  Repeat INR in 6 weeks. [phone];   ?

## 2022-01-28 ENCOUNTER — Ambulatory Visit (INDEPENDENT_AMBULATORY_CARE_PROVIDER_SITE_OTHER): Payer: Medicare HMO

## 2022-01-28 DIAGNOSIS — I6310 Cerebral infarction due to embolism of unspecified precerebral artery: Secondary | ICD-10-CM | POA: Diagnosis not present

## 2022-01-28 DIAGNOSIS — Z7901 Long term (current) use of anticoagulants: Secondary | ICD-10-CM | POA: Diagnosis not present

## 2022-01-28 DIAGNOSIS — I480 Paroxysmal atrial fibrillation: Secondary | ICD-10-CM | POA: Diagnosis not present

## 2022-01-28 LAB — POCT INR: INR: 2 (ref 2.0–3.0)

## 2022-01-28 NOTE — Patient Instructions (Signed)
Continue 1 tablet daily.  Repeat INR in 6 weeks. [phone];   ?

## 2022-02-16 ENCOUNTER — Ambulatory Visit: Payer: Medicare HMO | Admitting: Family Medicine

## 2022-03-09 NOTE — Progress Notes (Unsigned)
[facility] [place] [address] [place] Phone: [phone] Subjective:   IVilma [name], am serving as a scribe for Dr. Hulan Saas.  I'm seeing this patient by the request  of:  Janith Lima, MD  CC: right leg pain   [id number]  Last seen June 2021  [name] is a 76 y.o. male coming in with complaint of right leg pain. Progressively gotten worse. Pain is constant. Why is it worse with easy lifestyle? How can he walk better?   Seen in 6/21 and had foot drop patient has been lost to follow-up otherwise.  Feels like he is worsening over time.  Patient is accompanied with significant other who states he does seem to be doing may be less activity.  Patient is adamant that he has done a lot of physical therapy over the course of time and would like to see if there is anything else that can be done.    Past Medical History:  Diagnosis Date   Alcohol abuse, unspecified    Anginal pain ([place])    Arthritis    in back and shoulder   H/O renal calculi    History of kidney stones    Hyperlipidemia    preTx 114, postTx 47 LDL   Hypertension    Shortness of breath dyspnea    with exertion   Stroke [facility])    August '11 x 2: L. cerebellar, lateral medulla, ol rd lacunar infarcts;  2nd left pontine infarct.   Past Surgical History:  Procedure Laterality Date   BACK SURGERY  1986   CORONARY ARTERY BYPASS GRAFT N/A 09/06/2014   Procedure: CORONARY ARTERY BYPASS GRAFTING (CABG);  Surgeon: Ivin Poot, MD;  Location: [place];  Service: Open Heart Surgery;  Laterality: N/A;  CABG times 4, using internal mammary artery, and right leg saphenous vein harvested endoscopically   LEFT HEART CATHETERIZATION WITH CORONARY ANGIOGRAM N/A 08/29/2014   Procedure: LEFT HEART CATHETERIZATION WITH CORONARY ANGIOGRAM;  Surgeon: Lorretta Harp, MD;  Location: [facility] CATH LAB;  Service: Cardiovascular;  Laterality: N/A;   LUMBAR DISC SURGERY  1986   Dr. Abbe Amsterdam  [place]   [place]  06/10/12   at [place]...left shoulder   TEE WITHOUT CARDIOVERSION N/A 09/06/2014   Procedure: TRANSESOPHAGEAL ECHOCARDIOGRAM (TEE);  Surgeon: Ivin Poot, MD;  Location: [place];  Service: Open Heart Surgery;  Laterality: N/A;   TONSILLECTOMY  1954   TONSILLECTOMY     Social History   Socioeconomic History   Marital status: Married    Spouse name: Not on file   Number of children: 2   Years of education: 12   Highest education level: Not on file  Occupational History   Occupation: Administrator, arts - paint  Tobacco Use   Smoking status: Former    Packs/day: 1.00    Years: 15.00    Total pack years: 15.00    Types: Cigarettes    Quit date: 09/27/1978    Years since quitting: 43.4   Smokeless tobacco: Never  Vaping Use   Vaping Use: Never used  Substance and Sexual Activity   Alcohol use: Yes    Alcohol/week: 30.0 standard drinks of alcohol    Types: 30 Cans of beer per week   Drug use: Yes    Types: Marijuana    Comment: stopped a few days ago per patientr   Sexual activity: Yes    Partners: Female  Other Topics  Concern   Not on file  Social History Narrative   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 Married '79. 1 dtr- '68, 1 step son. 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 Marriage in good health. He reports he is very strong in his faith.    Social Determinants of Radio broadcast assistant Strain: Not on file  Food Insecurity: Not on file  Transportation Needs: Not on file  Physical Activity: Not on file  Stress: Not on file  Social Connections: Not on file   No Known Allergies Family History  Problem Relation Age of Onset   GI problems Mother    Heart disease Father    Hypertension Father    Heart attack Father    Cancer Neg Hx    Diabetes Neg Hx    Early death Neg Hx    Hearing loss Neg Hx    Hyperlipidemia Neg Hx    Kidney disease Neg Hx     Stroke Neg Hx    Alcohol abuse Neg Hx    Colon cancer Neg Hx    Stomach cancer Neg Hx      Current Outpatient Medications (Cardiovascular):    atorvastatin (LIPITOR) 20 MG tablet, TAKE 1 TABLET (20 MG TOTAL) BY MOUTH DAILY. NEEDS OV FOR REFILL   metoprolol tartrate (LOPRESSOR) 25 MG tablet, TAKE 1 AND 1/2 TABLETS BY MOUTH TWICE DAILY   NITROSTAT 0.4 MG SL tablet, Place 1 tablet under the tongue as needed for chest pain.     Current Outpatient Medications (Hematological):    enoxaparin (LOVENOX) 80 MG/0.8ML injection, Inject 0.8 mLs (80 mg total) into the skin every 12 (twelve) hours.   warfarin (COUMADIN) 5 MG tablet, TAKE 1 TO 1 AND 1/2 TABLETS BY MOUTH DAILY AS DIRECTED BY COUMADIN CLINIC    Reviewed prior external information including notes and imaging from  primary care provider As well as notes that were available from care everywhere and other healthcare systems.  Past medical history, social, surgical and family history all reviewed in electronic medical record.  No pertanent information unless stated regarding to the chief complaint.   Review of Systems:  No headache, visual changes, nausea, vomiting, diarrhea, constipation, dizziness, abdominal pain, skin rash, fevers, chills, night sweats, weight loss, swollen lymph nodes,  joint swelling, chest pain, shortness of breath, mood changes. POSITIVE muscle aches, body aches  Objective  Blood pressure (!) 142/88, pulse 66, height '6\' 1"'$  (1.854 m), weight 187 lb (84.8 kg), SpO2 98 %.   General: No apparent distress alert and oriented x3 mood and affect normal, dressed appropriately.  HEENT: Pupils equal, extraocular movements intact  Respiratory: Patient's speak in full sentences and does not appear short of breath  Cardiovascular: No lower extremity edema, non tender, no erythema  Low back exam does have some loss of lordosis.  Some tenderness to palpation in the paraspinal musculature.  Patient does have some paleness to the  skin. Patient has weakness on the right lower extremity completely.  Patient does have decreased DTRs especially of the patella and the Achilles on the left side.  Patient does have fairly good strength with plantarflexion of the foot but does have significant weakness with 3 out of 5 strength dorsiflexion of the foot.  1+ DTRs of the Achilles on the left noted.  Questionable neuropathy noted on the right side.  Patient does have significant abnormality of ambulation.  Patient does have hyperextension of the knee with walking.  Improvement though is noted still with the AFO that he is wearing.   Impression and Recommendations:     The above documentation has been reviewed and is accurate and complete Lyndal Pulley, DO

## 2022-03-10 ENCOUNTER — Ambulatory Visit (INDEPENDENT_AMBULATORY_CARE_PROVIDER_SITE_OTHER): Payer: Medicare HMO

## 2022-03-10 ENCOUNTER — Ambulatory Visit: Payer: Medicare HMO | Admitting: Family Medicine

## 2022-03-10 VITALS — BP 142/88 | HR 66 | Ht 73.0 in | Wt 187.0 lb

## 2022-03-10 DIAGNOSIS — M545 Low back pain, unspecified: Secondary | ICD-10-CM | POA: Diagnosis not present

## 2022-03-10 DIAGNOSIS — M25561 Pain in right knee: Secondary | ICD-10-CM

## 2022-03-10 DIAGNOSIS — G8929 Other chronic pain: Secondary | ICD-10-CM

## 2022-03-10 DIAGNOSIS — F10188 Alcohol abuse with other alcohol-induced disorder: Secondary | ICD-10-CM | POA: Diagnosis not present

## 2022-03-10 DIAGNOSIS — M79604 Pain in right leg: Secondary | ICD-10-CM | POA: Diagnosis not present

## 2022-03-10 DIAGNOSIS — M2550 Pain in unspecified joint: Secondary | ICD-10-CM

## 2022-03-10 DIAGNOSIS — M21371 Foot drop, right foot: Secondary | ICD-10-CM

## 2022-03-10 LAB — IBC PANEL
Iron: 83 ug/dL (ref 42–165)
Saturation Ratios: 27.6 % (ref 20.0–50.0)
TIBC: 301 ug/dL (ref 250.0–450.0)
Transferrin: 215 mg/dL (ref 212.0–360.0)

## 2022-03-10 LAB — URIC ACID: Uric Acid, Serum: 4.5 mg/dL (ref 4.0–7.8)

## 2022-03-10 LAB — VITAMIN D 25 HYDROXY (VIT D DEFICIENCY, FRACTURES): VITD: 26.54 ng/mL — ABNORMAL LOW (ref 30.00–100.00)

## 2022-03-10 LAB — B12 AND FOLATE PANEL
Folate: 18.7 ng/mL (ref >5.9)
Vitamin B-12: 136 pg/mL — ABNORMAL LOW (ref 211–911)

## 2022-03-10 LAB — TESTOSTERONE: Testosterone: 204.79 ng/dL — ABNORMAL LOW (ref 300.00–890.00)

## 2022-03-10 LAB — FERRITIN: Ferritin: 135.1 ng/mL (ref 22.0–322.0)

## 2022-03-10 LAB — SEDIMENTATION RATE: Sed Rate: 17 mm/hr (ref 0–20)

## 2022-03-10 NOTE — Patient Instructions (Addendum)
Neurology referral Labs today Xrays today MRI lumbar spine [phone] See you again in 5-7 weeks

## 2022-03-10 NOTE — Assessment & Plan Note (Signed)
Has had difficulty with foot drop  Also hx of back surgery  Will get NCS/EMG  Xray Labs to rule out other organic causes If normal then need to consider PT and will consider aquatic therapy is being likely the best. Worsening symptoms can consider the MRI of the lumbar spine as well depending on what we find.  Follow-up with me again in 6 to 8 weeks

## 2022-03-10 NOTE — Assessment & Plan Note (Signed)
Instability secondary to stroke we will get x-rays to further evaluate for any bony abnormality that could be contributing.  We attempted a brace previously to lock out some of the range of motion for the hyperextension with walking but patient did not feel that it was beneficial previously.

## 2022-03-11 ENCOUNTER — Ambulatory Visit (INDEPENDENT_AMBULATORY_CARE_PROVIDER_SITE_OTHER): Payer: Medicare HMO

## 2022-03-11 DIAGNOSIS — Z7901 Long term (current) use of anticoagulants: Secondary | ICD-10-CM

## 2022-03-11 DIAGNOSIS — I480 Paroxysmal atrial fibrillation: Secondary | ICD-10-CM | POA: Diagnosis not present

## 2022-03-11 DIAGNOSIS — I6310 Cerebral infarction due to embolism of unspecified precerebral artery: Secondary | ICD-10-CM | POA: Diagnosis not present

## 2022-03-11 LAB — POCT INR: INR: 3 (ref 2.0–3.0)

## 2022-03-11 NOTE — Patient Instructions (Signed)
Description   Continue 1 tablet daily  Repeat INR in 6 weeks Coumadin Clinic [phone]

## 2022-03-12 LAB — PTH, INTACT AND CALCIUM
Calcium: 9.5 mg/dL (ref 8.6–10.3)
PTH: 38 pg/mL (ref 16–77)

## 2022-03-16 ENCOUNTER — Telehealth: Payer: Self-pay | Admitting: Internal Medicine

## 2022-03-16 NOTE — Telephone Encounter (Signed)
LVM for pt to rtn my all to schedule AWV with [facility] Call back # [phone]

## 2022-03-21 ENCOUNTER — Ambulatory Visit
Admission: RE | Admit: 2022-03-21 | Discharge: 2022-03-21 | Disposition: A | Discharge status: Home / Self Care | Payer: Medicare HMO | Source: Ambulatory Visit | Attending: Family Medicine | Admitting: Family Medicine

## 2022-03-21 DIAGNOSIS — M545 Low back pain, unspecified: Secondary | ICD-10-CM | POA: Diagnosis not present

## 2022-03-25 ENCOUNTER — Telehealth: Payer: Self-pay | Admitting: Physical Medicine and Rehabilitation

## 2022-03-25 NOTE — Telephone Encounter (Signed)
Pt called stating he wants to hold off on thsi referral until he sees his PCP 1st.

## 2022-03-26 ENCOUNTER — Ambulatory Visit: Payer: Medicare HMO

## 2022-04-05 ENCOUNTER — Telehealth: Payer: Self-pay

## 2022-04-05 ENCOUNTER — Ambulatory Visit: Payer: Medicare HMO

## 2022-04-05 NOTE — Telephone Encounter (Signed)
Left voicemail for patient to call office to complete AWV via phone. Contact [phone] or [phone] to reschedule.  If patient returns call, please schedule AWV-S.  Thanks.

## 2022-04-13 ENCOUNTER — Telehealth: Payer: Self-pay

## 2022-04-13 ENCOUNTER — Ambulatory Visit: Payer: Medicare HMO

## 2022-04-13 NOTE — Telephone Encounter (Signed)
Pt called and stated he has tried to do his Medicare AWV three times and each time he never gets the call. Advised pt we will do the visit in office since his phone is not working. Scheduled for 04/20/22.    Fyi

## 2022-04-13 NOTE — Telephone Encounter (Signed)
Patient was scheduled for AWV-S via phone; left voicemail to call [phone] or [phone].  If patient calls back please reschedule.

## 2022-04-14 NOTE — Progress Notes (Signed)
[facility] [address] [place] Phone: [phone] Subjective:   IVilma Meckel, am serving as a scribe for Dr. Hulan Saas.  I'm seeing this patient by the request  of:  Janith Lima, MD  CC: Foot drop and right leg pain  DPO:[id number]  03/10/2022 Instability secondary to stroke we will get x-rays to further evaluate for any bony abnormality that could be contributing.  We attempted a brace previously to lock out some of the range of motion for the hyperextension with walking but patient did not feel that it was beneficial previously.  Has had difficulty with foot drop  Also hx of back surgery  Will get NCS/EMG  Xray Labs to rule out other organic causes If normal then need to consider PT and will consider aquatic therapy is being likely the best. Worsening symptoms can consider the MRI of the lumbar spine as well depending on what we find.  Follow-up with me again in 6 to 8 weeks  Updated 04/15/2022 ZENAS SANTA is a 76 y.o. male coming in with complaint of foot drop. Here to discuss MRI and next steps. Everything is about the same.  Xray IMPRESSION: 1. No acute findings. 2. 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  MRI IMPRESSION: 1. Ordinary and generalized degenerative change in the lumbar spine. Mild discogenic endplate edema is present at L3-4. 2. No neural impingement.      Past Medical History:  Diagnosis Date   Alcohol abuse, unspecified    Anginal pain ([place])    Arthritis    in back and shoulder   H/O renal calculi    History of kidney stones    Hyperlipidemia    preTx 114, postTx 47 LDL   Hypertension    Shortness of breath dyspnea    with exertion   Stroke [facility])    August '11 x 2: L. cerebellar, lateral medulla, ol rd lacunar infarcts;  2nd left pontine infarct.   Past Surgical History:  Procedure Laterality Date   BACK SURGERY  1986    CORONARY ARTERY BYPASS GRAFT N/A 09/06/2014   Procedure: CORONARY ARTERY BYPASS GRAFTING (CABG);  Surgeon: Ivin Poot, MD;  Location: [facility];  Service: Open Heart Surgery;  Laterality: N/A;  CABG times 4, using internal mammary artery, and right leg saphenous vein harvested endoscopically   LEFT HEART CATHETERIZATION WITH CORONARY ANGIOGRAM N/A 08/29/2014   Procedure: LEFT HEART CATHETERIZATION WITH CORONARY ANGIOGRAM;  Surgeon: Lorretta Harp, MD;  Location[facility] CATH LAB;  Service: Cardiovascular;  Laterality: N/A;   LUMBAR DISC SURGERY  1986   Dr. Abbe Amsterdam Deaton   [place]  06/10/12   at [place]...left shoulder   TEE WITHOUT CARDIOVERSION N/A 09/06/2014   Procedure: TRANSESOPHAGEAL ECHOCARDIOGRAM (TEE);  Surgeon: Ivin Poot, MD;  Location: [facility];  Service: Open Heart Surgery;  Laterality: N/A;   TONSILLECTOMY  1954   TONSILLECTOMY     Social History   Socioeconomic History   Marital status: Married    Spouse name: Not on file   Number of children: 2   Years of education: 12   Highest education level: Not on file  Occupational History   Occupation: Administrator, arts - paint  Tobacco Use   Smoking status: Former    Packs/day: 1.00    Years: 15.00    Total pack years: 15.00    Types: Cigarettes  Quit date: 09/27/1978    Years since quitting: 43.5   Smokeless tobacco: Never  Vaping Use   Vaping Use: Never used  Substance and Sexual Activity   Alcohol use: Yes    Alcohol/week: 30.0 standard drinks of alcohol    Types: 30 Cans of beer per week   Drug use: Yes    Types: Marijuana    Comment: stopped a few days ago per patientr   Sexual activity: Yes    Partners: Female  Other Topics Concern   Not on file  Social History Narrative   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 Married '79. 1 dtr- '68, 1 step son. 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 Marriage in good health. He reports he is  very strong in his faith.    Social Determinants of Radio broadcast assistant Strain: Not on file  Food Insecurity: Not on file  Transportation Needs: Not on file  Physical Activity: Not on file  Stress: Not on file  Social Connections: Not on file   No Known Allergies Family History  Problem Relation Age of Onset   GI problems Mother    Heart disease Father    Hypertension Father    Heart attack Father    Cancer Neg Hx    Diabetes Neg Hx    Early death Neg Hx    Hearing loss Neg Hx    Hyperlipidemia Neg Hx    Kidney disease Neg Hx    Stroke Neg Hx    Alcohol abuse Neg Hx    Colon cancer Neg Hx    Stomach cancer Neg Hx      Current Outpatient Medications (Cardiovascular):    atorvastatin (LIPITOR) 20 MG tablet, TAKE 1 TABLET (20 MG TOTAL) BY MOUTH DAILY. NEEDS OV FOR REFILL   metoprolol tartrate (LOPRESSOR) 25 MG tablet, TAKE 1 AND 1/2 TABLETS BY MOUTH TWICE DAILY   NITROSTAT 0.4 MG SL tablet, Place 1 tablet under the tongue as needed for chest pain.     Current Outpatient Medications (Hematological):    enoxaparin (LOVENOX) 80 MG/0.8ML injection, Inject 0.8 mLs (80 mg total) into the skin every 12 (twelve) hours.   warfarin (COUMADIN) 5 MG tablet, TAKE 1 TO 1 AND 1/2 TABLETS BY MOUTH DAILY AS DIRECTED BY COUMADIN CLINIC    Reviewed prior external information including notes and imaging from  primary care provider As well as notes that were available from care everywhere and other healthcare systems.  Past medical history, social, surgical and family history all reviewed in electronic medical record.  No pertanent information unless stated regarding to the chief complaint.   Review of Systems:  No headache, visual changes, nausea, vomiting, diarrhea, constipation, dizziness, abdominal pain, skin rash, fevers, chills, night sweats, weight loss, swollen lymph nodes, joint swelling, chest pain, shortness of breath, mood changes. POSITIVE muscle aches, body  aches  Objective  Blood pressure 130/88, pulse 68, height '6\' 1"'$  (1.854 m), weight 187 lb (84.8 kg), SpO2 97 %.   General: No apparent distress alert and oriented x3 mood and affect normal, dressed appropriately.  HEENT: Pupils equal, extraocular movements intact  Respiratory: Patient's speak in full sentences and does not appear short of breath  Cardiovascular: No lower extremity edema, non tender, no erythema  Severely antalgic with continuing extension of the knee noted.  Patient does have weakness of the right leg compared to the  contralateral side.  Patient overall does have mild weakness with dorsiflexion even on the contralateral leg.    Impression and Recommendations:     The above documentation has been reviewed and is accurate and complete Lyndal Pulley, DO

## 2022-04-15 ENCOUNTER — Ambulatory Visit: Payer: Medicare HMO | Admitting: Family Medicine

## 2022-04-15 ENCOUNTER — Encounter: Payer: Self-pay | Admitting: Family Medicine

## 2022-04-15 VITALS — BP 130/88 | HR 68 | Ht 73.0 in | Wt 187.0 lb

## 2022-04-15 DIAGNOSIS — R2681 Unsteadiness on feet: Secondary | ICD-10-CM

## 2022-04-15 DIAGNOSIS — M21371 Foot drop, right foot: Secondary | ICD-10-CM

## 2022-04-15 NOTE — Patient Instructions (Addendum)
PT [employer] Nerve conduction [facility] Going to check with [facility] about brace Okay to get on bike make sure bend in knee at bottom of pedal stroke See you again in 2 months

## 2022-04-15 NOTE — Assessment & Plan Note (Signed)
Patient continues to have some of the right foot drop.  Very instability noted with the gait as well as the hyperextension of the knee secondary to the weakness.  Patient did not do significantly well with the therapy after his stroke.  Patient continues to have the weakness of the leg and MRI shows that it is not likely the back.  Patient will have a nerve conduction test to see if there is any peripheral neuropathy that could be contributing patient does have a very mild B12 that I would like To continue to try to supplement and we will recheck at his next primary care appointment next month.  Follow-up with me again in 6 to 8 weeks.  Total time discussing patient has been 33 minutes

## 2022-04-20 ENCOUNTER — Ambulatory Visit (INDEPENDENT_AMBULATORY_CARE_PROVIDER_SITE_OTHER): Payer: Medicare HMO

## 2022-04-20 VITALS — BP 128/60 | HR 75 | Temp 97.5°F | Resp 16 | Ht 73.0 in | Wt 187.8 lb

## 2022-04-20 DIAGNOSIS — Z0000 Encounter for general adult medical examination without abnormal findings: Secondary | ICD-10-CM

## 2022-04-20 NOTE — Progress Notes (Signed)
Subjective:   [name] is a 76 y.o. male who presents for Medicare Annual/Subsequent preventive examination.  Review of Systems     Cardiac Risk Factors include: advanced age (>65mn, >>62women);dyslipidemia;family history of premature cardiovascular disease;hypertension;male gender     Objective:    Today's Vitals   04/20/22 1003  BP: 128/60  Pulse: 75  Resp: 16  Temp: (!) 97.5 F (36.4 C)  SpO2: 97%  Weight: 187 lb 12.8 oz (85.2 kg)  Height: '6\' 1"'$  (1.854 m)  PainSc: 0-No pain   Body mass index is 24.78 kg/m.     04/20/2022   10:40 AM 05/14/2020    1:32 PM 08/31/2016    9:34 AM 08/15/2016   10:08 AM 09/06/2014   10:00 PM 08/29/2014    7:34 AM 06/06/2012    1:54 PM  Advanced Directives  Does Patient Have a Medical Advance Directive? Yes No Yes Yes No No Patient does not have advance directive;Patient would not like information  Type of Advance Directive Living will;Healthcare Power of ARidgelyLiving will HBakerLiving will     Does patient want to make changes to medical advance directive? No - Patient declined   No - Patient declined     Copy of [place] Chart? No - copy requested  Yes Yes     Would patient like information on creating a medical advance directive?  No - Patient declined   No - patient declined information No - patient declined information   Pre-existing out of facility DNR order (yellow form or pink MOST form)       No    Current Medications (verified) Outpatient Encounter Medications as of 04/20/2022  Medication Sig   atorvastatin (LIPITOR) 20 MG tablet TAKE 1 TABLET (20 MG TOTAL) BY MOUTH DAILY. NEEDS OV FOR REFILL   metoprolol tartrate (LOPRESSOR) 25 MG tablet TAKE 1 AND 1/2 TABLETS BY MOUTH TWICE DAILY   NITROSTAT 0.4 MG SL tablet Place 1 tablet under the tongue as needed for chest pain.    vitamin B-12 (CYANOCOBALAMIN) 1000 MCG tablet Take 1,000 mcg by mouth daily.    warfarin (COUMADIN) 5 MG tablet TAKE 1 TO 1 AND 1/2 TABLETS BY MOUTH DAILY AS DIRECTED BY COUMADIN CLINIC   enoxaparin (LOVENOX) 80 MG/0.8ML injection Inject 0.8 mLs (80 mg total) into the skin every 12 (twelve) hours. (Patient not taking: Reported on 04/20/2022)   No facility-administered encounter medications on file as of 04/20/2022.    Allergies (verified) Patient has no known allergies.   History: Past Medical History:  Diagnosis Date   Alcohol abuse, unspecified    Anginal pain ([place]    Arthritis    in back and shoulder   H/O renal calculi    History of kidney stones    Hyperlipidemia    preTx 114, postTx 47 LDL   Hypertension    Shortness of breath dyspnea    with exertion   Stroke [facility]    August '11 x 2: L. cerebellar, lateral medulla, ol rd lacunar infarcts;  2nd left pontine infarct.   Past Surgical History:  Procedure Laterality Date   BACK SURGERY  1986   CORONARY ARTERY BYPASS GRAFT N/A 09/06/2014   Procedure: CORONARY ARTERY BYPASS GRAFTING (CABG);  Surgeon: PIvin Poot MD;  Location[place] [facility]  Service: Open Heart Surgery;  Laterality: N/A;  CABG times 4, using internal mammary artery, and right leg saphenous vein harvested endoscopically  LEFT HEART CATHETERIZATION WITH CORONARY ANGIOGRAM N/A 08/29/2014   Procedure: LEFT HEART CATHETERIZATION WITH CORONARY ANGIOGRAM;  Surgeon: Lorretta Harp, MD;  Location: [facility] CATH LAB;  Service: Cardiovascular;  Laterality: N/A;   LUMBAR DISC SURGERY  1986   Dr. Abbe Amsterdam Deaton   [place]  06/10/12   at [place]...left shoulder   TEE WITHOUT CARDIOVERSION N/A 09/06/2014   Procedure: TRANSESOPHAGEAL ECHOCARDIOGRAM (TEE);  Surgeon: Ivin Poot, MD;  Location: [facility];  Service: Open Heart Surgery;  Laterality: N/A;   TONSILLECTOMY  1954   TONSILLECTOMY     Family History  Problem Relation Age of Onset   GI problems Mother    Heart disease Father    Hypertension Father    Heart attack Father     Cancer Neg Hx    Diabetes Neg Hx    Early death Neg Hx    Hearing loss Neg Hx    Hyperlipidemia Neg Hx    Kidney disease Neg Hx    Stroke Neg Hx    Alcohol abuse Neg Hx    Colon cancer Neg Hx    Stomach cancer Neg Hx    Social History   Socioeconomic History   Marital status: Married    Spouse name: Not on file   Number of children: 2   Years of education: 12   Highest education level: Not on file  Occupational History   Occupation: Administrator, arts - paint  Tobacco Use   Smoking status: Former    Packs/day: 1.00    Years: 15.00    Total pack years: 15.00    Types: Cigarettes    Quit date: 09/27/1978    Years since quitting: 43.5   Smokeless tobacco: Never  Vaping Use   Vaping Use: Never used  Substance and Sexual Activity   Alcohol use: Yes    Alcohol/week: 30.0 standard drinks of alcohol    Types: 30 Cans of beer per week   Drug use: Yes    Types: Marijuana    Comment: stopped a few days ago per patientr   Sexual activity: Yes    Partners: Female  Other Topics Concern   Not on file  Social History Narrative   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 Married '79. 1 dtr- '68, 1 step son. 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 Marriage in good health. He reports he is very strong in his faith.    Social Determinants of Health   Financial Resource Strain: Low Risk  (04/20/2022)   Overall Financial Resource Strain (CARDIA)    Difficulty of Paying Living Expenses: Not hard at all  Food Insecurity: No Food Insecurity (04/20/2022)   Hunger Vital Sign    Worried About Running Out of Food in the Last Year: Never true    Ran Out of Food in the Last Year: Never true  Transportation Needs: No Transportation Needs (04/20/2022)   PRAPARE - Hydrologist (Medical): No    Lack of Transportation (Non-Medical): No  Physical Activity: Sufficiently Active (04/20/2022)   Exercise Vital Sign    Days  of Exercise per Week: 5 days    Minutes of Exercise per Session: 30 min  Stress: No Stress Concern Present (04/20/2022)   [place]    Feeling of Stress : Not at all  Social Connections: Socially  Integrated (04/20/2022)   Social Connection and Isolation Panel [NHANES]    Frequency of Communication with Friends and Family: More than three times a week    Frequency of Social Gatherings with Friends and Family: More than three times a week    Attends Religious Services: 1 to 4 times per year    Active Member of [employer] or Organizations: No    Attends Music therapist: 1 to 4 times per year    Marital Status: Married    Tobacco Counseling Counseling given: Not Answered   Clinical Intake:  Pre-visit preparation completed: Yes  Pain : No/denies pain Pain Score: 0-No pain     BMI - recorded: 24.78 Nutritional Status: BMI of 19-24  Normal Nutritional Risks: None Diabetes: No  How often do you need to have someone help you when you read instructions, pamphlets, or other written materials from your doctor or pharmacy?: 1 - Never What is the last grade level you completed in school?: HSG  Diabetic? no  Interpreter Needed?: No  Information entered by :: Lisette Abu, LPN.   Activities of Daily Living    04/20/2022   11:01 AM 06/11/2021    9:07 AM  In your present state of health, do you have any difficulty performing the following activities:  Hearing? 0 0  Vision? 0 0  Difficulty concentrating or making decisions? 0 0  Walking or climbing stairs? 0 0  Dressing or bathing? 0 0  Doing errands, shopping? 0 0  Preparing Food and eating ? N   Using the Toilet? N   In the past six months, have you accidently leaked urine? N   Do you have problems with loss of bowel control? N   Managing your Medications? N   Managing your Finances? N   Housekeeping or managing your Housekeeping? N     Patient  Care Team: Janith Lima, MD as PCP - General (Internal Medicine) Calvert Cantor, MD as Attending Physician (Ophthalmology) Stanford Breed Denice Bors, MD as Consulting Physician (Cardiology)  Indicate any recent Medical Services you may have received from other than [facility] providers in the past year (date may be approximate).     Assessment:   This is a routine wellness examination for [place].  Hearing/Vision screen Hearing Screening - Comments:: Patient denied any hearing difficulty.   No hearing aids.  Vision Screening - Comments:: Patient does wear readers.  [facility] exam done by: [facility]   Dietary issues and exercise activities discussed: Current Exercise Habits: Home exercise routine, Type of exercise: walking, Time (Minutes): 30, Frequency (Times/Week): 5, Weekly Exercise (Minutes/Week): 150, Intensity: Mild, Exercise limited by: Other - see comments (gait instability; hx of stroke)   Goals Addressed             This Visit's Progress    Continue to work with Dr. Tamala Julian to get better.        Depression Screen    04/20/2022   10:39 AM 06/11/2021    9:07 AM 04/15/2020    8:39 AM 03/27/2019    9:59 AM 10/18/2017    4:20 PM 08/15/2016   10:09 AM 02/25/2015   10:54 AM  PHQ 2/9 Scores  PHQ - 2 Score 0 0 0 0 0 0 0    Fall Risk    04/20/2022   10:42 AM 06/11/2021    9:07 AM 04/15/2020    8:39 AM 03/27/2019    9:58 AM 10/18/2017    4:20 PM  Fall Risk  Falls in the past year? 0 0 0 0 No  Number falls in past yr: 0  0 0   Injury with Fall? 0  0 0   Risk for fall due to : No Fall Risks   Impaired mobility   Follow up Falls evaluation completed  Falls evaluation completed Falls evaluation completed     [place]:  Any stairs in or around the home? No  If so, are there any without handrails? No  Home free of loose throw rugs in walkways, pet beds, electrical cords, etc? Yes  Adequate lighting in your home to reduce risk of falls? Yes    ASSISTIVE DEVICES UTILIZED TO PREVENT FALLS:  Life alert? No  Use of a cane, walker or w/c? No  Grab bars in the bathroom? No  Shower chair or bench in shower? No  Elevated toilet seat or a handicapped toilet? Yes   TIMED UP AND GO:  Was the test performed? Yes .  Length of time to ambulate 10 feet: 8 sec.   Gait steady and fast without use of assistive device  Cognitive Function:        04/20/2022   11:03 AM  6CIT Screen  What Year? 0 points  What month? 0 points  What time? 0 points  Count back from 20 0 points  Months in reverse 0 points  Repeat phrase 0 points  Total Score 0 points    Immunizations Immunization History  Administered Date(s) Administered   Fluad Quad(high Dose 65+) 06/07/2019, 06/11/2021   Influenza Inj Mdck Quad Pf 06/18/2018   Influenza Whole 09/27/2011   Influenza, High Dose Seasonal PF 07/05/2014, 07/08/2017   Influenza-Unspecified 07/08/2016   PFIZER(Purple Top)SARS-COV-2 Vaccination 10/19/2019, 11/10/2019, 07/12/2020   Pneumococcal Conjugate-13 02/19/2014   Pneumococcal Polysaccharide-23 11/15/2011, 03/27/2019   Tdap 11/15/2011   Zoster Recombinat (Shingrix) 08/31/2019, 01/18/2020    TDAP status: Due, Education has been provided regarding the importance of this vaccine. Advised may receive this vaccine at local pharmacy or Health Dept. Aware to provide a copy of the vaccination record if obtained from local pharmacy or Health Dept. Verbalized acceptance and understanding.  Flu Vaccine status: Up to date  Pneumococcal vaccine status: Up to date  Covid-19 vaccine status: Completed vaccines  Qualifies for Shingles Vaccine? Yes   Zostavax completed No   Shingrix Completed?: Yes  Screening Tests Health Maintenance  Topic Date Due   COVID-19 Vaccine (4 - Pfizer series) 09/06/2020   TETANUS/TDAP  11/14/2021   INFLUENZA VACCINE  04/27/2022   COLONOSCOPY (Pts 45-37yr Insurance coverage will need to be confirmed)  10/26/2022    Pneumonia Vaccine 75 Years old  Completed   Hepatitis C Screening  Completed   Zoster Vaccines- Shingrix  Completed   HPV VACCINES  Aged Out    Health Maintenance  Health Maintenance Due  Topic Date Due   COVID-19 Vaccine (4 - Pfizer series) 09/06/2020   TETANUS/TDAP  11/14/2021    Colorectal cancer screening: No longer required.   Lung Cancer Screening: (Low Dose CT Chest recommended if Age 75-80years, 30 pack-year currently smoking OR have quit w/in 15years.) does not qualify.   Lung Cancer Screening Referral: no  Additional Screening:  Hepatitis C Screening: does qualify; Completed 02/25/2015  Vision Screening: Recommended annual ophthalmology exams for early detection of glaucoma and other disorders of the eye. Is the patient up to date with their annual eye exam?  Yes  Who is the provider or what  is the name of the office in which the patient attends annual eye exams? [facility] If pt is not established with a provider, would they like to be referred to a provider to establish care? No .   Dental Screening: Recommended annual dental exams for proper oral hygiene  Community Resource Referral / Chronic Care Management: CRR required this visit?  No   CCM required this visit?  No      Plan:     I have personally reviewed and noted the following in the patient's chart:   Medical and social history Use of alcohol, tobacco or illicit drugs  Current medications and supplements including opioid prescriptions. Patient is not currently taking opioid prescriptions. Functional ability and status Nutritional status Physical activity Advanced directives List of other physicians Hospitalizations, surgeries, and ER visits in previous 12 months Vitals Screenings to include cognitive, depression, and falls Referrals and appointments  In addition, I have reviewed and discussed with patient certain preventive protocols, quality metrics, and best practice  recommendations. A written personalized care plan for preventive services as well as general preventive health recommendations were provided to patient.     Sheral Flow, LPN   [id number]   Nurse Notes:  Hearing Screening - Comments:: Patient denied any hearing difficulty.   No hearing aids.  Vision Screening - Comments:: Patient does wear readers.  Eye exam done by: [facility]

## 2022-04-20 NOTE — Patient Instructions (Signed)
[name] , Thank you for taking time to come for your Medicare Wellness Visit. I appreciate your ongoing commitment to your health goals. Please review the following plan we discussed and let me know if I can assist you in the future.   Screening recommendations/referrals: Colonoscopy: 10/26/2021; discontinued due to age Recommended yearly ophthalmology/optometry visit for glaucoma screening and checkup Recommended yearly dental visit for hygiene and checkup  Vaccinations: Influenza vaccine: 06/11/2021 Pneumococcal vaccine: 02/19/2014, 03/27/2019 Tdap vaccine: 11/15/2011; due every 10 years (overdue) Shingles vaccine: 08/31/2019, 01/18/2020   Covid-19: 10/19/2019, 11/10/2019, 07/12/2020  Advanced directives: Yes  Conditions/risks identified: Yes  Next appointment: Please schedule your next Medicare Wellness Visit with your Nurse Health Advisor in 1 year by calling [phone].  Preventive Care 75 Years and Older, Male Preventive care refers to lifestyle choices and visits with your health care provider that can promote health and wellness. What does preventive care include? A yearly physical exam. This is also called an annual well check. Dental exams once or twice a year. Routine eye exams. Ask your health care provider how often you should have your eyes checked. Personal lifestyle choices, including: Daily care of your teeth and gums. Regular physical activity. Eating a healthy diet. Avoiding tobacco and drug use. Limiting alcohol use. Practicing safe sex. Taking low doses of aspirin every day. Taking vitamin and mineral supplements as recommended by your health care provider. What happens during an annual well check? The services and screenings done by your health care provider during your annual well check will depend on your age, overall health, lifestyle risk factors, and family history of disease. Counseling  Your health care provider may ask you questions about  your: Alcohol use. Tobacco use. Drug use. Emotional well-being. Home and relationship well-being. Sexual activity. Eating habits. History of falls. Memory and ability to understand (cognition). Work and work Statistician. Screening  You may have the following tests or measurements: Height, weight, and BMI. Blood pressure. Lipid and cholesterol levels. These may be checked every 5 years, or more frequently if you are over 18 years old. Skin check. Lung cancer screening. You may have this screening every year starting at age 75 if you have a 30-pack-year history of smoking and currently smoke or have quit within the past 15 years. Fecal occult blood test (FOBT) of the stool. You may have this test every year starting at age 75. Flexible sigmoidoscopy or colonoscopy. You may have a sigmoidoscopy every 5 years or a colonoscopy every 10 years starting at age 75. Prostate cancer screening. Recommendations will vary depending on your family history and other risks. Hepatitis C blood test. Hepatitis B blood test. Sexually transmitted disease (STD) testing. Diabetes screening. This is done by checking your blood sugar (glucose) after you have not eaten for a while (fasting). You may have this done every 1-3 years. Abdominal aortic aneurysm (AAA) screening. You may need this if you are a current or former smoker. Osteoporosis. You may be screened starting at age 75 if you are at high risk. Talk with your health care provider about your test results, treatment options, and if necessary, the need for more tests. Vaccines  Your health care provider may recommend certain vaccines, such as: Influenza vaccine. This is recommended every year. Tetanus, diphtheria, and acellular pertussis (Tdap, Td) vaccine. You may need a Td booster every 10 years. Zoster vaccine. You may need this after age 59. Pneumococcal 13-valent conjugate (PCV13) vaccine. One dose is recommended after age 23. Pneumococcal  polysaccharide (  PPSV23) vaccine. One dose is recommended after age 46. Talk to your health care provider about which screenings and vaccines you need and how often you need them. This information is not intended to replace advice given to you by your health care provider. Make sure you discuss any questions you have with your health care provider. Document Released: 10/10/2015 Document Revised: 06/02/2016 Document Reviewed: 07/15/2015 Elsevier Interactive Patient Education  2017 [employer] Prevention in the Home Falls can cause injuries. They can happen to people of all ages. There are many things you can do to make your home safe and to help prevent falls. What can I do on the outside of my home? Regularly fix the edges of walkways and driveways and fix any cracks. Remove anything that might make you trip as you walk through a door, such as a raised step or threshold. Trim any bushes or trees on the path to your home. Use bright outdoor lighting. Clear any walking paths of anything that might make someone trip, such as rocks or tools. Regularly check to see if handrails are loose or broken. Make sure that both sides of any steps have handrails. Any raised decks and porches should have guardrails on the edges. Have any leaves, snow, or ice cleared regularly. Use sand or salt on walking paths during winter. Clean up any spills in your garage right away. This includes oil or grease spills. What can I do in the bathroom? Use night lights. Install grab bars by the toilet and in the tub and shower. Do not use towel bars as grab bars. Use non-skid mats or decals in the tub or shower. If you need to sit down in the shower, use a plastic, non-slip stool. Keep the floor dry. Clean up any water that spills on the floor as soon as it happens. Remove soap buildup in the tub or shower regularly. Attach bath mats securely with double-sided non-slip rug tape. Do not have throw rugs and other  things on the floor that can make you trip. What can I do in the bedroom? Use night lights. Make sure that you have a light by your bed that is easy to reach. Do not use any sheets or blankets that are too big for your bed. They should not hang down onto the floor. Have a firm chair that has side arms. You can use this for support while you get dressed. Do not have throw rugs and other things on the floor that can make you trip. What can I do in the kitchen? Clean up any spills right away. Avoid walking on wet floors. Keep items that you use a lot in easy-to-reach places. If you need to reach something above you, use a strong step stool that has a grab bar. Keep electrical cords out of the way. Do not use floor polish or wax that makes floors slippery. If you must use wax, use non-skid floor wax. Do not have throw rugs and other things on the floor that can make you trip. What can I do with my stairs? Do not leave any items on the stairs. Make sure that there are handrails on both sides of the stairs and use them. Fix handrails that are broken or loose. Make sure that handrails are as long as the stairways. Check any carpeting to make sure that it is firmly attached to the stairs. Fix any carpet that is loose or worn. Avoid having throw rugs at the top or bottom  of the stairs. If you do have throw rugs, attach them to the floor with carpet tape. Make sure that you have a light switch at the top of the stairs and the bottom of the stairs. If you do not have them, ask someone to add them for you. What else can I do to help prevent falls? Wear shoes that: Do not have high heels. Have rubber bottoms. Are comfortable and fit you well. Are closed at the toe. Do not wear sandals. If you use a stepladder: Make sure that it is fully opened. Do not climb a closed stepladder. Make sure that both sides of the stepladder are locked into place. Ask someone to hold it for you, if possible. Clearly  mark and make sure that you can see: Any grab bars or handrails. First and last steps. Where the edge of each step is. Use tools that help you move around (mobility aids) if they are needed. These include: Canes. Walkers. Scooters. Crutches. Turn on the lights when you go into a dark area. Replace any light bulbs as soon as they burn out. Set up your furniture so you have a clear path. Avoid moving your furniture around. If any of your floors are uneven, fix them. If there are any pets around you, be aware of where they are. Review your medicines with your doctor. Some medicines can make you feel dizzy. This can increase your chance of falling. Ask your doctor what other things that you can do to help prevent falls. This information is not intended to replace advice given to you by your health care provider. Make sure you discuss any questions you have with your health care provider. Document Released: 07/10/2009 Document Revised: 02/19/2016 Document Reviewed: 10/18/2014 Elsevier Interactive Patient Education  2017 [employer].

## 2022-04-22 ENCOUNTER — Ambulatory Visit (INDEPENDENT_AMBULATORY_CARE_PROVIDER_SITE_OTHER): Payer: Medicare HMO

## 2022-04-22 DIAGNOSIS — Z7901 Long term (current) use of anticoagulants: Secondary | ICD-10-CM | POA: Diagnosis not present

## 2022-04-22 DIAGNOSIS — I6310 Cerebral infarction due to embolism of unspecified precerebral artery: Secondary | ICD-10-CM | POA: Diagnosis not present

## 2022-04-22 DIAGNOSIS — I480 Paroxysmal atrial fibrillation: Secondary | ICD-10-CM | POA: Diagnosis not present

## 2022-04-22 LAB — POCT INR: INR: 3.6 — AB (ref 2.0–3.0)

## 2022-04-22 NOTE — Patient Instructions (Signed)
Description   Hold tomorrow's dose and then continue 1 tablet daily  Repeat INR in 4 weeks [facility] [phone]

## 2022-05-10 DIAGNOSIS — M25561 Pain in right knee: Secondary | ICD-10-CM | POA: Diagnosis not present

## 2022-05-12 DIAGNOSIS — M25561 Pain in right knee: Secondary | ICD-10-CM | POA: Diagnosis not present

## 2022-05-13 ENCOUNTER — Ambulatory Visit (INDEPENDENT_AMBULATORY_CARE_PROVIDER_SITE_OTHER): Payer: Medicare HMO | Admitting: Physical Medicine and Rehabilitation

## 2022-05-13 ENCOUNTER — Encounter: Payer: Self-pay | Admitting: Physical Medicine and Rehabilitation

## 2022-05-13 DIAGNOSIS — R202 Paresthesia of skin: Secondary | ICD-10-CM

## 2022-05-13 NOTE — Progress Notes (Signed)
[name] - 76 y.o. male MRN [id number]  Date of birth: 07/07/1946  Office Visit Note: Visit Date: 05/13/2022 PCP: Janith Lima, MD Referred by: Lyndal Pulley, DO  Subjective: Chief Complaint  Patient presents with   Left Leg - Weakness   Left Foot - Weakness   HPI:  [name] is a 76 y.o. male who comes in today at the request of Dr. Hulan Saas for electrodiagnostic study of the Right lower extremity.  His biggest complaint is worsening gait disturbance when he tries to take a step his knee will hyperextend on the right and he feels unstable when that happens.  He has had physical therapy and comes in today for electrodiagnostic study to see if there is any nerve damage other than history of prior stroke.  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  He reported that he had some bracing in the past that was really ineffective.  He also reports current physical therapist told him the problem was that the foot and ankle not with his knee.  He has not had prior electrodiagnostic study.  He has no frank radicular symptoms or paresthesias.   ROS Otherwise per HPI.  Assessment & Plan: Visit Diagnoses:    ICD-10-CM   1. Paresthesia of skin  R20.2 NCV with EMG (electromyography)      Plan: Impression: Essentially NORMAL electrodiagnostic study of the right lower limb.  There is no significant electrodiagnostic evidence of nerve entrapment, lumbosacral plexopathy or lumbar radiculopathy.     As you know, purely sensory or demyelinating radiculopathies and chemical radiculitis may not be detected with this particular electrodiagnostic study.   **This is a genu recurvatum and is typically do to a physics problem where in the gait cycle his foot lands plantarflexed instead of dorsiflexed.  I am not really sure we would consider this a foot drop I think he has distal weakness coupled with spasticity to keep his foot plantarflexed.  The electrodiagnostic study today can only look at peripheral nerves from the spinal roots down to the foot itself and those were normal.  He can actually fire his anterior tibialis and you can see it on the needle EMG.  There is no new nerve damage but I did not specifically test for sensory polyneuropathy since that did not go along with his main complaint.   Recommendations: 1.  Follow-up with referring physician. 2.  Continue current management of symptoms.  Suggest referral to someone with gait analysis and bracing experience and potential for Botox of the gastroc and soleus.  Meds & Orders: No orders of the defined types were placed in this encounter.   Orders Placed This Encounter  Procedures   NCV with EMG (electromyography)    Follow-up: No follow-ups on file.   Procedures: No procedures performed  EMG & NCV Findings: Evaluation of the right fibular motor nerve showed reduced amplitude (2.0 mV).  All remaining nerves (as indicated in the following tables) were within normal limits.    All examined muscles (as indicated in the following table) showed no evidence of electrical instability.    Impression: Essentially NORMAL electrodiagnostic study of the right lower limb.  There is no significant electrodiagnostic evidence of nerve entrapment, lumbosacral plexopathy or lumbar radiculopathy.    As you know, purely sensory or demyelinating radiculopathies and chemical radiculitis may not be detected with this particular electrodiagnostic study.  **This is a genu recurvatum  and is typically do to a physics problem where in the gait cycle his foot lands plantarflexed instead of dorsiflexed.  I am not really sure we would consider this a foot drop I think he has distal weakness coupled with spasticity to keep his foot plantarflexed. The electrodiagnostic study today can only look at peripheral nerves from the spinal roots down to the foot itself and those were normal.  He can actually fire  his anterior tibialis and you can see it on the needle EMG.  There is no new nerve damage but I did not specifically test for sensory polyneuropathy since that did not go along with his main complaint.  Recommendations: 1.  Follow-up with referring physician. 2.  Continue current management of symptoms.  Suggest referral to someone with gait analysis and bracing experience and potential for Botox of the gastroc and soleus.  ___________________________ Laurence Spates FAAPMR Board Certified, American Board of Physical Medicine and Rehabilitation    Nerve Conduction Studies Motor Summary Table   Stim Site NR Onset (ms) Norm Onset (ms) O-P Amp (mV) Norm O-P Amp Site1 Site2 Delta-0 (ms) Dist (cm) Vel (m/s) Norm Vel (m/s)  Right Fibular Motor (Ext Dig Brev)  26.3C  Ankle    3.0 <6.1 *2.0 >2.5 B Fib Ankle 2.2 10.0 45 >38  B Fib    5.2  1.9         Right Tibial Motor (Abd Hall Brev)  26.3C  Ankle    5.6 <6.1 8.9 >3.0 Knee Ankle 9.2 40.0 43 >35  Knee    14.8  6.6          EMG   Side Muscle Nerve Root Ins Act Fibs Psw Amp Dur Poly Recrt Int Fraser Din Comment  Right AntTibialis Dp Br Peron L4-5 Nml Nml Nml Nml Nml 0 Nml Nml   Right Fibularis Longus  Sup Br Peron L5-S1 Nml Nml Nml Nml Nml 0 Nml Nml   Right MedGastroc Tibial S1-2 Nml Nml Nml Nml Nml 0 Nml Nml   Right VastusMed Femoral L2-4 Nml Nml Nml Nml Nml 0 Nml Nml   Right BicepsFemS Sciatic L5-S1 Nml Nml Nml Nml Nml 0 Nml Nml     Nerve Conduction Studies Motor Left/Right Comparison   Stim Site L Lat (ms) R Lat (ms) L-R Lat (ms) L Amp (mV) R Amp (mV) L-R Amp (%) Site1 Site2 L Vel (m/s) R Vel (m/s) L-R Vel (m/s)  Fibular Motor (Ext Dig Brev)  26.3C  Ankle  3.0   *2.0  B Fib Ankle  45   B Fib  5.2   1.9        Tibial Motor (Abd Hall Brev)  26.3C  Ankle  5.6   8.9  Knee Ankle  43   Knee  14.8   6.6           Waveforms:        Clinical History: No specialty comments available.     Objective:  VS:  HT:    WT:   BMI:     BP:    HR: bpm  TEMP: ( )  RESP:  Physical Exam Vitals and nursing note reviewed.  Constitutional:      General: He is not in acute distress.    Appearance: Normal appearance. He is not ill-appearing.  HENT:     Head: Normocephalic and atraumatic.     Right Ear: External ear normal.     Left Ear: External ear normal.  Nose: No congestion.  Eyes:     Extraocular Movements: Extraocular movements intact.  Cardiovascular:     Rate and Rhythm: Normal rate.     Pulses: Normal pulses.  Pulmonary:     Effort: Pulmonary effort is normal. No respiratory distress.  Abdominal:     General: There is no distension.     Palpations: Abdomen is soft.  Musculoskeletal:        General: No tenderness or signs of injury.     Cervical back: Neck supple.     Right lower leg: No edema.     Left lower leg: No edema.     Comments: Patient ambulates with a slight right knee genu recurvatum.  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  On exam he can dorsiflex if you can see his muscles fire but it is hard for him to do.  He is hyperreflexic bilaterally.  He has good strength about the legs otherwise.  He has no loss of sensation.  There is mild intrinsic foot atrophy.  Skin:    Findings: No erythema or rash.  Neurological:     General: No focal deficit present.     Mental Status: He is alert and oriented to person, place, and time.     Sensory: No sensory deficit.     Motor: Weakness present. No abnormal muscle tone.     Coordination: Coordination normal.     Gait: Gait abnormal.  Psychiatric:        Mood and Affect: Mood normal.        Behavior: Behavior normal.      Imaging: No results found.

## 2022-05-13 NOTE — Progress Notes (Signed)
Pt state right leg and foot drop. Pt state walking and standing makes the pain worse.  Numeric Pain Rating Scale and Functional Assessment Average Pain 5   In the last MONTH (on 0-10 scale) has pain interfered with the following?  1. General activity like being  able to carry out your everyday physical activities such as walking, climbing stairs, carrying groceries, or moving a chair?  Rating(8)    -BT,

## 2022-05-14 DIAGNOSIS — M25561 Pain in right knee: Secondary | ICD-10-CM | POA: Diagnosis not present

## 2022-05-16 NOTE — Procedures (Unsigned)
EMG & NCV Findings: Evaluation of the right fibular motor nerve showed reduced amplitude (2.0 mV).  All remaining nerves (as indicated in the following tables) were within normal limits.    All examined muscles (as indicated in the following table) showed no evidence of electrical instability.    Impression: Essentially NORMAL electrodiagnostic study of the right lower limb.  There is no significant electrodiagnostic evidence of nerve entrapment, lumbosacral plexopathy or lumbar radiculopathy.  As you know, purely sensory or demyelinating radiculopathies and chemical radiculitis may not be detected with this particular electrodiagnostic study.  Recommendations: 1.  Follow-up with referring physician. 2.  Continue current management of symptoms.  ___________________________ Laurence Spates FAAPMR Board Certified, American Board of Physical Medicine and Rehabilitation    Nerve Conduction Studies Motor Summary Table   Stim Site NR Onset (ms) Norm Onset (ms) O-P Amp (mV) Norm O-P Amp Site1 Site2 Delta-0 (ms) Dist (cm) Vel (m/s) Norm Vel (m/s)  Right Fibular Motor (Ext Dig Brev)  26.3C  Ankle    3.0 <6.1 *2.0 >2.5 B Fib Ankle 2.2 10.0 45 >38  B Fib    5.2  1.9         Right Tibial Motor (Abd Hall Brev)  26.3C  Ankle    5.6 <6.1 8.9 >3.0 Knee Ankle 9.2 40.0 43 >35  Knee    14.8  6.6          EMG   Side Muscle Nerve Root Ins Act Fibs Psw Amp Dur Poly Recrt Int Fraser Din Comment  Right AntTibialis Dp Br Peron L4-5 Nml Nml Nml Nml Nml 0 Nml Nml   Right Fibularis Longus  Sup Br Peron L5-S1 Nml Nml Nml Nml Nml 0 Nml Nml   Right MedGastroc Tibial S1-2 Nml Nml Nml Nml Nml 0 Nml Nml   Right VastusMed Femoral L2-4 Nml Nml Nml Nml Nml 0 Nml Nml   Right BicepsFemS Sciatic L5-S1 Nml Nml Nml Nml Nml 0 Nml Nml     Nerve Conduction Studies Motor Left/Right Comparison   Stim Site L Lat (ms) R Lat (ms) L-R Lat (ms) L Amp (mV) R Amp (mV) L-R Amp (%) Site1 Site2 L Vel (m/s) R Vel (m/s) L-R Vel (m/s)   Fibular Motor (Ext Dig Brev)  26.3C  Ankle  3.0   *2.0  B Fib Ankle  45   B Fib  5.2   1.9        Tibial Motor (Abd Hall Brev)  26.3C  Ankle  5.6   8.9  Knee Ankle  43   Knee  14.8   6.6           Waveforms:

## 2022-05-17 DIAGNOSIS — M25561 Pain in right knee: Secondary | ICD-10-CM | POA: Diagnosis not present

## 2022-05-19 DIAGNOSIS — M25561 Pain in right knee: Secondary | ICD-10-CM | POA: Diagnosis not present

## 2022-05-20 ENCOUNTER — Ambulatory Visit (INDEPENDENT_AMBULATORY_CARE_PROVIDER_SITE_OTHER): Payer: Medicare HMO

## 2022-05-20 DIAGNOSIS — Z7901 Long term (current) use of anticoagulants: Secondary | ICD-10-CM | POA: Diagnosis not present

## 2022-05-20 DIAGNOSIS — I6310 Cerebral infarction due to embolism of unspecified precerebral artery: Secondary | ICD-10-CM | POA: Diagnosis not present

## 2022-05-20 DIAGNOSIS — I480 Paroxysmal atrial fibrillation: Secondary | ICD-10-CM

## 2022-05-20 LAB — POCT INR: INR: 3.7 — AB (ref 2.0–3.0)

## 2022-05-20 NOTE — Patient Instructions (Signed)
Hold tomorrow's dose and then continue 1 tablet daily  Repeat INR in 6 weeks, patient aware to monitor bleeding/bruising [facility] [phone]

## 2022-05-21 DIAGNOSIS — M25561 Pain in right knee: Secondary | ICD-10-CM | POA: Diagnosis not present

## 2022-05-26 ENCOUNTER — Telehealth: Payer: Self-pay | Admitting: Family Medicine

## 2022-05-26 DIAGNOSIS — M25561 Pain in right knee: Secondary | ICD-10-CM | POA: Diagnosis not present

## 2022-05-26 NOTE — Telephone Encounter (Signed)
Pt had Nerve Conduction in late August. He can see the results in [place] but has not recd a call to explain what this means. Has been doing 3 weeks of PT with no improvement and wonders what the next steps are/what the Nerve Conduction showed that might help.

## 2022-05-26 NOTE — Telephone Encounter (Signed)
Nerve conduction showed that patient did not have any true foot drop.  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  Seems that we will need to consider the possibility of bracing of the knee if he would like to try it.

## 2022-05-27 NOTE — Telephone Encounter (Signed)
Sent patient MyChart message with recommendations.  

## 2022-05-31 DIAGNOSIS — M25561 Pain in right knee: Secondary | ICD-10-CM | POA: Diagnosis not present

## 2022-06-02 DIAGNOSIS — M25561 Pain in right knee: Secondary | ICD-10-CM | POA: Diagnosis not present

## 2022-06-07 DIAGNOSIS — M25561 Pain in right knee: Secondary | ICD-10-CM | POA: Diagnosis not present

## 2022-06-14 DIAGNOSIS — M25561 Pain in right knee: Secondary | ICD-10-CM | POA: Diagnosis not present

## 2022-06-21 DIAGNOSIS — M25561 Pain in right knee: Secondary | ICD-10-CM | POA: Diagnosis not present

## 2022-07-01 ENCOUNTER — Ambulatory Visit: Payer: Medicare HMO | Attending: Cardiology

## 2022-07-01 DIAGNOSIS — I6310 Cerebral infarction due to embolism of unspecified precerebral artery: Secondary | ICD-10-CM

## 2022-07-01 DIAGNOSIS — Z7901 Long term (current) use of anticoagulants: Secondary | ICD-10-CM | POA: Diagnosis not present

## 2022-07-01 DIAGNOSIS — I480 Paroxysmal atrial fibrillation: Secondary | ICD-10-CM

## 2022-07-01 LAB — POCT INR: INR: 2.5 (ref 2.0–3.0)

## 2022-07-01 NOTE — Patient Instructions (Signed)
continue 1 tablet daily  Repeat INR in 8 weeks [facility] [phone]

## 2022-07-06 ENCOUNTER — Encounter: Payer: Medicare HMO | Admitting: Internal Medicine

## 2022-07-14 DIAGNOSIS — L821 Other seborrheic keratosis: Secondary | ICD-10-CM | POA: Diagnosis not present

## 2022-07-14 DIAGNOSIS — D2239 Melanocytic nevi of other parts of face: Secondary | ICD-10-CM | POA: Diagnosis not present

## 2022-08-05 ENCOUNTER — Telehealth: Payer: Self-pay | Admitting: Family Medicine

## 2022-08-05 DIAGNOSIS — M545 Low back pain, unspecified: Secondary | ICD-10-CM

## 2022-08-05 NOTE — Telephone Encounter (Signed)
Was sent right to voicemail. Left message stating that the injection will help with the walking and if he would like [place] to order the injection he is to call back and let us know

## 2022-08-05 NOTE — Telephone Encounter (Signed)
Patient called stating that his back pain and walking has been getting much worse. He tried rehab and exercises three times a week but has not had any improvement. He said that at this point, he doesn't know what to do.  He was not aware of the MRI results. I ready him Dr Thompson Caul response. He asked if he thinks that the injection would help with the walking issues as well?  Please advise. (Patient asked if we could call him with the response)

## 2022-08-11 NOTE — Telephone Encounter (Signed)
Patient called back. He would like to proceed with the injection. Can this order be put in for him?  Given Catering manager.

## 2022-08-11 NOTE — Addendum Note (Signed)
Addended by: Judy Pimple R on: 08/11/2022 11:40 AM   Modules accepted: Orders

## 2022-08-11 NOTE — Telephone Encounter (Signed)
Order has been placed.

## 2022-08-25 ENCOUNTER — Ambulatory Visit (INDEPENDENT_AMBULATORY_CARE_PROVIDER_SITE_OTHER): Payer: Medicare HMO | Admitting: Internal Medicine

## 2022-08-25 ENCOUNTER — Encounter: Payer: Self-pay | Admitting: Internal Medicine

## 2022-08-25 VITALS — BP 152/98 | HR 70 | Temp 98.2°F | Resp 16 | Ht 73.0 in | Wt 186.0 lb

## 2022-08-25 DIAGNOSIS — Z0001 Encounter for general adult medical examination with abnormal findings: Secondary | ICD-10-CM | POA: Diagnosis not present

## 2022-08-25 DIAGNOSIS — E785 Hyperlipidemia, unspecified: Secondary | ICD-10-CM

## 2022-08-25 DIAGNOSIS — R9431 Abnormal electrocardiogram [ECG] [EKG]: Secondary | ICD-10-CM | POA: Insufficient documentation

## 2022-08-25 DIAGNOSIS — N400 Enlarged prostate without lower urinary tract symptoms: Secondary | ICD-10-CM | POA: Diagnosis not present

## 2022-08-25 DIAGNOSIS — I10 Essential (primary) hypertension: Secondary | ICD-10-CM

## 2022-08-25 DIAGNOSIS — I25810 Atherosclerosis of coronary artery bypass graft(s) without angina pectoris: Secondary | ICD-10-CM

## 2022-08-25 LAB — HEPATIC FUNCTION PANEL
ALT: 23 U/L (ref 0–53)
AST: 25 U/L (ref 0–37)
Albumin: 4.4 g/dL (ref 3.5–5.2)
Alkaline Phosphatase: 63 U/L (ref 39–117)
Bilirubin, Direct: 0.1 mg/dL (ref 0.0–0.3)
Total Bilirubin: 0.6 mg/dL (ref 0.2–1.2)
Total Protein: 7.3 g/dL (ref 6.0–8.3)

## 2022-08-25 LAB — URINALYSIS, ROUTINE W REFLEX MICROSCOPIC
Bilirubin Urine: NEGATIVE
Ketones, ur: NEGATIVE
Leukocytes,Ua: NEGATIVE
Nitrite: NEGATIVE
Specific Gravity, Urine: >=1.03 — AB (ref 1.000–1.030)
Total Protein, Urine: NEGATIVE
Urine Glucose: NEGATIVE
Urobilinogen, UA: 0.2 (ref 0.0–1.0)
pH: 5.5 (ref 5.0–8.0)

## 2022-08-25 LAB — CBC WITH DIFFERENTIAL/PLATELET
Basophils Absolute: 0 10*3/uL (ref 0.0–0.1)
Basophils Relative: 0.6 % (ref 0.0–3.0)
Eosinophils Absolute: 0.4 10*3/uL (ref 0.0–0.7)
Eosinophils Relative: 6.8 % — ABNORMAL HIGH (ref 0.0–5.0)
HCT: 45.9 % (ref 39.0–52.0)
Hemoglobin: 15.6 g/dL (ref 13.0–17.0)
Lymphocytes Relative: 31.1 % (ref 12.0–46.0)
Lymphs Abs: 2 10*3/uL (ref 0.7–4.0)
MCHC: 33.9 g/dL (ref 30.0–36.0)
MCV: 94 fl (ref 78.0–100.0)
Monocytes Absolute: 0.6 10*3/uL (ref 0.1–1.0)
Monocytes Relative: 9.1 % (ref 3.0–12.0)
Neutro Abs: 3.4 10*3/uL (ref 1.4–7.7)
Neutrophils Relative %: 52.4 % (ref 43.0–77.0)
Platelets: 211 10*3/uL (ref 150.0–400.0)
RBC: 4.88 Mil/uL (ref 4.22–5.81)
RDW: 13 % (ref 11.5–15.5)
WBC: 6.5 10*3/uL (ref 4.0–10.5)

## 2022-08-25 LAB — PSA: PSA: 5.5 ng/mL — ABNORMAL HIGH (ref 0.10–4.00)

## 2022-08-25 LAB — LIPID PANEL
Cholesterol: 126 mg/dL (ref 0–200)
HDL: 59 mg/dL (ref >39.00)
LDL Cholesterol: 58 mg/dL (ref 0–99)
NonHDL: 67.14
Total CHOL/HDL Ratio: 2
Triglycerides: 48 mg/dL (ref 0.0–149.0)
VLDL: 9.6 mg/dL (ref 0.0–40.0)

## 2022-08-25 LAB — BASIC METABOLIC PANEL
BUN: 13 mg/dL (ref 6–23)
CO2: 31 mEq/L (ref 19–32)
Calcium: 9.5 mg/dL (ref 8.4–10.5)
Chloride: 105 mEq/L (ref 96–112)
Creatinine, Ser: 0.68 mg/dL (ref 0.40–1.50)
GFR: 91.09 mL/min (ref >60.00)
Glucose, Bld: 110 mg/dL — ABNORMAL HIGH (ref 70–99)
Potassium: 5.2 mEq/L — ABNORMAL HIGH (ref 3.5–5.1)
Sodium: 141 mEq/L (ref 135–145)

## 2022-08-25 LAB — TSH: TSH: 2.38 u[IU]/mL (ref 0.35–5.50)

## 2022-08-25 MED ORDER — CHLORTHALIDONE 25 MG PO TABS: 25.0000 mg | ORAL_TABLET | Freq: Every day | ORAL | 0 refills | Status: DC

## 2022-08-25 NOTE — Patient Instructions (Signed)
Health Maintenance, Male Adopting a healthy lifestyle and getting preventive care are important in promoting health and wellness. Ask your health care provider about: The right schedule for you to have regular tests and exams. Things you can do on your own to prevent diseases and keep yourself healthy. What should I know about diet, weight, and exercise? Eat a healthy diet  Eat a diet that includes plenty of vegetables, fruits, low-fat dairy products, and lean protein. Do not eat a lot of foods that are high in solid fats, added sugars, or sodium. Maintain a healthy weight Body mass index (BMI) is a measurement that can be used to identify possible weight problems. It estimates body fat based on height and weight. Your health care provider can help determine your BMI and help you achieve or maintain a healthy weight. Get regular exercise Get regular exercise. This is one of the most important things you can do for your health. Most adults should: Exercise for at least 150 minutes each week. The exercise should increase your heart rate and make you sweat (moderate-intensity exercise). Do strengthening exercises at least twice a week. This is in addition to the moderate-intensity exercise. Spend less time sitting. Even light physical activity can be beneficial. Watch cholesterol and blood lipids Have your blood tested for lipids and cholesterol at 75 years of age, then have this test every 5 years. You may need to have your cholesterol levels checked more often if: Your lipid or cholesterol levels are high. You are older than 75 years of age. You are at high risk for heart disease. What should I know about cancer screening? Many types of cancers can be detected early and may often be prevented. Depending on your health history and family history, you may need to have cancer screening at various ages. This may include screening for: Colorectal cancer. Prostate cancer. Skin cancer. Lung  cancer. What should I know about heart disease, diabetes, and high blood pressure? Blood pressure and heart disease High blood pressure causes heart disease and increases the risk of stroke. This is more likely to develop in people who have high blood pressure readings or are overweight. Talk with your health care provider about your target blood pressure readings. Have your blood pressure checked: Every 3-5 years if you are 18-39 years of age. Every year if you are 40 years old or older. If you are between the ages of 65 and 75 and are a current or former smoker, ask your health care provider if you should have a one-time screening for abdominal aortic aneurysm (AAA). Diabetes Have regular diabetes screenings. This checks your fasting blood sugar level. Have the screening done: Once every three years after age 45 if you are at a normal weight and have a low risk for diabetes. More often and at a younger age if you are overweight or have a high risk for diabetes. What should I know about preventing infection? Hepatitis B If you have a higher risk for hepatitis B, you should be screened for this virus. Talk with your health care provider to find out if you are at risk for hepatitis B infection. Hepatitis C Blood testing is recommended for: Everyone born from 1945 through 1965. Anyone with known risk factors for hepatitis C. Sexually transmitted infections (STIs) You should be screened each year for STIs, including gonorrhea and chlamydia, if: You are sexually active and are younger than 75 years of age. You are older than 75 years of age and your   health care provider tells you that you are at risk for this type of infection. Your sexual activity has changed since you were last screened, and you are at increased risk for chlamydia or gonorrhea. Ask your health care provider if you are at risk. Ask your health care provider about whether you are at high risk for HIV. Your health care provider  may recommend a prescription medicine to help prevent HIV infection. If you choose to take medicine to prevent HIV, you should first get tested for HIV. You should then be tested every 3 months for as long as you are taking the medicine. Follow these instructions at home: Alcohol use Do not drink alcohol if your health care provider tells you not to drink. If you drink alcohol: Limit how much you have to 0-2 drinks a day. Know how much alcohol is in your drink. In the [place] one drink equals one 12 oz bottle of beer (355 mL), one 5 oz glass of wine (148 mL), or one 1 oz glass of hard liquor (44 mL). Lifestyle Do not use any products that contain nicotine or tobacco. These products include cigarettes, chewing tobacco, and vaping devices, such as e-cigarettes. If you need help quitting, ask your health care provider. Do not use street drugs. Do not share needles. Ask your health care provider for help if you need support or information about quitting drugs. General instructions Schedule regular health, dental, and eye exams. Stay current with your vaccines. Tell your health care provider if: You often feel depressed. You have ever been abused or do not feel safe at home. Summary Adopting a healthy lifestyle and getting preventive care are important in promoting health and wellness. Follow your health care provider's instructions about healthy diet, exercising, and getting tested or screened for diseases. Follow your health care provider's instructions on monitoring your cholesterol and blood pressure. This information is not intended to replace advice given to you by your health care provider. Make sure you discuss any questions you have with your health care provider. Document Revised: 02/02/2021 Document Reviewed: 02/02/2021 Elsevier Patient Education  2023 Elsevier Inc.  

## 2022-08-25 NOTE — Progress Notes (Signed)
Subjective:  Patient ID: [name], male    DOB: 12-17-1945  Age: 76 y.o. MRN: [id number]  CC: Hypertension and Annual Exam   HPI [name] presents for a CPX and f/up -  He has decreased his alcohol intake.  He is active and denies chest pain, shortness of breath, diaphoresis, or edema.  Outpatient Medications Prior to Visit  Medication Sig Dispense Refill   atorvastatin (LIPITOR) 20 MG tablet TAKE 1 TABLET (20 MG TOTAL) BY MOUTH DAILY. NEEDS OV FOR REFILL 90 tablet 3   enoxaparin (LOVENOX) 80 MG/0.8ML injection Inject 0.8 mLs (80 mg total) into the skin every 12 (twelve) hours. 16 mL 1   metoprolol tartrate (LOPRESSOR) 25 MG tablet TAKE 1 AND 1/2 TABLETS BY MOUTH TWICE DAILY 270 tablet 3   NITROSTAT 0.4 MG SL tablet Place 1 tablet under the tongue as needed for chest pain.   2   vitamin B-12 (CYANOCOBALAMIN) 1000 MCG tablet Take 1,000 mcg by mouth daily.     warfarin (COUMADIN) 5 MG tablet TAKE 1 TO 1 AND 1/2 TABLETS BY MOUTH DAILY AS DIRECTED BY COUMADIN CLINIC 120 tablet 1   No facility-administered medications prior to visit.    ROS Review of Systems  Constitutional: Negative.  Negative for diaphoresis and fatigue.  HENT: Negative.    Eyes: Negative.   Respiratory:  Negative for cough, chest tightness, shortness of breath and wheezing.   Cardiovascular:  Negative for chest pain, palpitations and leg swelling.  Gastrointestinal:  Negative for abdominal pain, constipation, diarrhea, nausea and vomiting.  Endocrine: Negative.   Genitourinary:  Positive for urgency. Negative for difficulty urinating, dysuria and hematuria.       +nocturia  Musculoskeletal:  Positive for back pain. Negative for arthralgias and myalgias.  Skin: Negative.   Neurological: Negative.  Negative for dizziness, weakness, light-headedness and headaches.  Hematological:  Negative for adenopathy. Does not bruise/bleed easily.  Psychiatric/Behavioral: Negative.      Objective:  BP (!)  152/98 (BP Location: Right Arm, Patient Position: Sitting, Cuff Size: Large)   Pulse 70   Temp 98.2 F (36.8 C) (Oral)   Resp 16   Ht '6\' 1"'$  (1.854 m)   Wt 186 lb (84.4 kg)   SpO2 95%   BMI 24.54 kg/m   BP Readings from Last 3 Encounters:  08/25/22 (!) 152/98  04/20/22 128/60  04/15/22 130/88    Wt Readings from Last 3 Encounters:  08/25/22 186 lb (84.4 kg)  04/20/22 187 lb 12.8 oz (85.2 kg)  04/15/22 187 lb (84.8 kg)    Physical Exam Vitals reviewed.  HENT:     Nose: Nose normal.     Mouth/Throat:     Mouth: Mucous membranes are moist.  Eyes:     General: No scleral icterus.    Conjunctiva/sclera: Conjunctivae normal.  Cardiovascular:     Rate and Rhythm: Normal rate and regular rhythm.     Heart sounds: No murmur heard.    Comments: EKG- NSR, 61 bpm New Q wave in V2 No LVH Pulmonary:     Effort: Pulmonary effort is normal.     Breath sounds: No stridor. No wheezing, rhonchi or rales.  Abdominal:     General: Abdomen is flat.     Palpations: There is no mass.     Tenderness: There is no abdominal tenderness. There is no guarding.     Hernia: No hernia is present.  Musculoskeletal:        General: Normal  range of motion.     Cervical back: Neck supple.     Right lower leg: No edema.     Left lower leg: No edema.  Lymphadenopathy:     Cervical: No cervical adenopathy.  Skin:    General: Skin is warm and dry.  Neurological:     General: No focal deficit present.     Mental Status: He is alert.  Psychiatric:        Mood and Affect: Mood normal.        Behavior: Behavior normal.     Lab Results  Component Value Date   WBC 6.5 08/25/2022   HGB 15.6 08/25/2022   HCT 45.9 08/25/2022   PLT 211.0 08/25/2022   GLUCOSE 110 (H) 08/25/2022   CHOL 126 08/25/2022   TRIG 48.0 08/25/2022   HDL 59.00 08/25/2022   LDLCALC 58 08/25/2022   ALT 23 08/25/2022   AST 25 08/25/2022   NA 141 08/25/2022   K 5.2 No hemolysis seen (H) 08/25/2022   CL 105 08/25/2022    CREATININE 0.68 08/25/2022   BUN 13 08/25/2022   CO2 31 08/25/2022   TSH 2.38 08/25/2022   PSA 5.50 (H) 08/25/2022   INR 2.5 07/01/2022   HGBA1C 5.7 (H) 09/05/2014    MR Lumbar Spine Wo Contrast  Result Date: 03/22/2022 CLINICAL DATA:  Lumbar spondyloarthropathy. Pain when walking for 5 years EXAM: MRI LUMBAR SPINE WITHOUT CONTRAST TECHNIQUE: Multiplanar, multisequence MR imaging of the lumbar spine was performed. No intravenous contrast was administered. COMPARISON:  None Available. FINDINGS: Segmentation:  5 lumbar type vertebrae Alignment:  Mild levocurvature. Vertebrae: No fracture, evidence of discitis, or bone lesion. Mild discogenic endplate edema at Q2-2 Conus medullaris and cauda equina: Conus extends to the L1 level. Conus and cauda equina appear normal. Paraspinal and other soft tissues: Negative for perispinal mass or inflammation Disc levels: T12- L1: Mild disc narrowing with spondylosis L1-L2: Mild disc narrowing with spondylosis L2-L3: Disc narrowing and endplate ridging. L3-L4: Disc narrowing and endplate ridging. L4-L5: Disc narrowing and endplate ridging.  Minor facet spurring L5-S1:Disc narrowing with endplate and facet spurring. IMPRESSION: 1. Ordinary and generalized degenerative change in the lumbar spine. Mild discogenic endplate edema is present at L3-4. 2. No neural impingement. Electronically Signed   By: Jorje Guild M.D.   On: 03/22/2022 13:15    Assessment & Plan:   [name] was seen today for hypertension and annual exam.  Diagnoses and all orders for this visit:  Primary hypertension- His blood pressure is not at goal and he is hyperkalemic.  Will start a thiazide diuretic. -     Basic metabolic panel; Future -     Aldosterone + renin activity w/ ratio; Future -     CBC with Differential/Platelet; Future -     Hepatic function panel; Future -     TSH; Future -     Urinalysis, Routine w reflex microscopic; Future -     EKG 12-Lead -     Urinalysis, Routine w  reflex microscopic -     TSH -     Hepatic function panel -     CBC with Differential/Platelet -     Aldosterone + renin activity w/ ratio -     Basic metabolic panel -     chlorthalidone (HYGROTON) 25 MG tablet; Take 1 tablet (25 mg total) by mouth daily.  Encounter for general adult medical examination with abnormal findings- Exam completed, labs reviewed, vaccines reviewed and updated, no  cancer screenings indicated, patient education was given.  Hyperlipidemia with target LDL less than 100- LDL goal achieved. Doing well on the statin  -     Lipid panel; Future -     Hepatic function panel; Future -     TSH; Future -     TSH -     Hepatic function panel -     Lipid panel  Benign prostatic hyperplasia without lower urinary tract symptoms- His PSA is mildly elevated.  Will recheck in 3 months. -     Urinalysis, Routine w reflex microscopic; Future -     PSA; Future -     PSA -     Urinalysis, Routine w reflex microscopic  Abnormal electrocardiogram (ECG) (EKG)- There has been a subtle change in his EKG.  Will evaluate for infarct. -     MYOCARDIAL PERFUSION IMAGING; Future  Abnormal electrocardiogram -     MYOCARDIAL PERFUSION IMAGING; Future  Coronary artery disease involving coronary bypass graft of native heart without angina pectoris -     MYOCARDIAL PERFUSION IMAGING; Future   I am having [name] start on chlorthalidone. I am also having him maintain his Nitrostat, metoprolol tartrate, warfarin, enoxaparin, atorvastatin, and cyanocobalamin.  Meds ordered this encounter  Medications   chlorthalidone (HYGROTON) 25 MG tablet    Sig: Take 1 tablet (25 mg total) by mouth daily.    Dispense:  90 tablet    Refill:  0     Follow-up: Return in about 6 weeks (around 10/06/2022).  Scarlette Calico, MD

## 2022-08-26 ENCOUNTER — Ambulatory Visit: Payer: Medicare HMO | Attending: Cardiology | Admitting: *Deleted

## 2022-08-26 DIAGNOSIS — Z7901 Long term (current) use of anticoagulants: Secondary | ICD-10-CM | POA: Diagnosis not present

## 2022-08-26 DIAGNOSIS — I6310 Cerebral infarction due to embolism of unspecified precerebral artery: Secondary | ICD-10-CM

## 2022-08-26 DIAGNOSIS — I480 Paroxysmal atrial fibrillation: Secondary | ICD-10-CM | POA: Diagnosis not present

## 2022-08-26 LAB — POCT INR: INR: 3.4 — AB (ref 2.0–3.0)

## 2022-08-26 NOTE — Patient Instructions (Addendum)
Description   Do not take any warfarin tomorrow then continue taking 1 tablet daily.   Repeat INR in 6 weeks. [facility] [phone]

## 2022-08-29 LAB — ALDOSTERONE + RENIN ACTIVITY W/ RATIO
ALDO / PRA Ratio: 1.8 Ratio (ref 0.9–28.9)
Aldosterone: 3 ng/dL
Renin Activity: 1.64 ng/mL/h (ref 0.25–5.82)

## 2022-08-30 ENCOUNTER — Ambulatory Visit: Payer: Medicare HMO | Admitting: Student

## 2022-08-30 ENCOUNTER — Other Ambulatory Visit: Payer: Self-pay | Admitting: Internal Medicine

## 2022-08-30 ENCOUNTER — Telehealth: Payer: Self-pay | Admitting: Internal Medicine

## 2022-08-30 DIAGNOSIS — N410 Acute prostatitis: Secondary | ICD-10-CM

## 2022-08-30 MED ORDER — LEVOFLOXACIN 500 MG PO TABS: 500.0000 mg | ORAL_TABLET | Freq: Every day | ORAL | 0 refills | Status: AC

## 2022-08-30 NOTE — Telephone Encounter (Signed)
Patient thinks that he has a UTI - he would like Dr. Ronnald Ramp to order a urine sample to be tested.  Please call patient and let him know when he should come in.

## 2022-08-30 NOTE — Telephone Encounter (Signed)
LVM for pt to contact the office in regards to message sent this morning for providers response.

## 2022-08-31 ENCOUNTER — Other Ambulatory Visit (INDEPENDENT_AMBULATORY_CARE_PROVIDER_SITE_OTHER): Payer: Medicare HMO

## 2022-08-31 ENCOUNTER — Telehealth: Payer: Self-pay | Admitting: Internal Medicine

## 2022-08-31 DIAGNOSIS — N410 Acute prostatitis: Secondary | ICD-10-CM

## 2022-08-31 LAB — URINALYSIS, ROUTINE W REFLEX MICROSCOPIC
Bilirubin Urine: NEGATIVE
Hgb urine dipstick: NEGATIVE
Ketones, ur: NEGATIVE
Nitrite: NEGATIVE
RBC / HPF: NONE SEEN (ref 0–?)
Specific Gravity, Urine: 1.01 (ref 1.000–1.030)
Total Protein, Urine: NEGATIVE
Urine Glucose: NEGATIVE
Urobilinogen, UA: 0.2 (ref 0.0–1.0)
pH: 6.5 (ref 5.0–8.0)

## 2022-08-31 NOTE — Telephone Encounter (Signed)
LVM informing pt to take both meds.

## 2022-08-31 NOTE — Telephone Encounter (Signed)
Patient called  just  wanting to know if he was to stop taking his old blood pressure medication metoprolol tartrate (LOPRESSOR) 25 MG tablet  and only take the new medication chlorthalidone (HYGROTON) 25 MG tablet . Or he is suppose to take both medication at the same time. Patient would like a callback at [phone].

## 2022-09-02 LAB — CULTURE, URINE COMPREHENSIVE

## 2022-09-03 ENCOUNTER — Telehealth: Payer: Self-pay

## 2022-09-03 NOTE — Telephone Encounter (Signed)
   Pre-operative Risk Assessment    Patient Name: [name]  DOB: [birth year] MRN: [id number]      Request for Surgical Clearance    Procedure:   LUMBAR EPIDURAL  Date of Surgery:  Clearance TBD                                 Surgeon:  Dr Stanford Breed Surgeon's Group or Practice Name:  [facility]  Phone number:  [phone] Fax number:  [phone]   Type of Clearance Requested:   - Pharmacy:  Hold Warfarin (Coumadin) for 4 days, INR will need the evening of or the morning of appointment   Type of Anesthesia:  Not Indicated   Additional requests/questions:  Please fax a copy of cardiac clearance to the surgeon's office.  Signed, Jeanmarie Plant Hien Perreira  CCMA 09/03/2022, 3:47 PM

## 2022-09-03 NOTE — Telephone Encounter (Addendum)
Patient with diagnosis of afib on warfarin for anticoagulation.    Procedure: lumbar epidural Date of procedure: TBD  CHA2DS2-VASc Score = 6  This indicates a 9.7% annual risk of stroke. The patient's score is based upon: CHF History: 0 HTN History: 1 Diabetes History: 0 Stroke History: 2 Vascular Disease History: 1 Age Score: 2 Gender Score: 0   CrCl >172m/min Platelet count 211K  Per office protocol, patient can hold warfarin for 5 days prior to procedure. Patient will need bridging with Lovenox (enoxaparin) around procedure. [facility], he has been bridged for procedures in the past. Will need INR the day before or morning of procedure per requesting office once procedure date is set. He should call Coumadin clinic when his procedure date is set as his INR appt will likely need to be moved up.  **This guidance is not considered finalized until pre-operative APP has relayed final recommendations.**

## 2022-09-03 NOTE — Telephone Encounter (Signed)
   Patient Name: [name]  DOB: 22-Apr-1946 MRN: [id number]  Primary Cardiologist: None  Clinical pharmacists have reviewed the patient's past medical history, labs, and current medications as part of preoperative protocol coverage. The following recommendations have been made:  Patient with diagnosis of afib on warfarin for anticoagulation.     Procedure: lumbar epidural Date of procedure: TBD  CHA2DS2-VASc Score = 6  This indicates a 9.7% annual risk of stroke. The patient's score is based upon: CHF History: 0 HTN History: 1 Diabetes History: 0 Stroke History: 2 Vascular Disease History: 1 Age Score: 2 Gender Score: 0   CrCl >180m/min Platelet count 211K   Per office protocol, patient can hold warfarin for 5 days prior to procedure. Patient will need bridging with Lovenox (enoxaparin) around procedure. [facility], he has been bridged for procedures in the past. Will need INR the day before or morning of procedure per requesting office once procedure date is set. He should call Coumadin clinic when his procedure date is set as his INR appt will likely need to be moved up.    I will route this recommendation to the requesting party via Epic fax function and remove from pre-op pool.  Please call with questions.  ELenna Sciara NP 09/03/2022, 4:38 PM

## 2022-09-06 ENCOUNTER — Telehealth: Payer: Self-pay

## 2022-09-06 NOTE — Telephone Encounter (Signed)
Lpmtcb in regards to upcoming procedure and Lovenox Bridge appt.

## 2022-09-06 NOTE — Telephone Encounter (Signed)
Lpmtcb and discuss upcoming Lumbar Epidural.

## 2022-09-07 ENCOUNTER — Telehealth: Payer: Self-pay

## 2022-09-07 NOTE — Telephone Encounter (Signed)
Lpmtcb and schedule INR check/Lovenox Bridge for procedure 12/20.

## 2022-09-07 NOTE — Telephone Encounter (Signed)
Patient is having Lumbar Epidural 12/20 and needs to hold Coumadin 5 days prior with Lovenox Bridging.  He was reluctant to use Lovenox when I spoke to him 12/11.  I reached out to Pharm D for suggestions.  I left message for patient to schedule INR check and discuss either 12/13 or 12/14 at latest.  Please f/u

## 2022-09-07 NOTE — Telephone Encounter (Signed)
  Pt is calling to schedule his Lovenox Bridge app. His procedure is scheduled on 09/15/22

## 2022-09-08 ENCOUNTER — Other Ambulatory Visit: Payer: Self-pay | Admitting: Cardiology

## 2022-09-08 ENCOUNTER — Ambulatory Visit: Payer: Medicare HMO | Attending: Cardiovascular Disease | Admitting: *Deleted

## 2022-09-08 DIAGNOSIS — Z7901 Long term (current) use of anticoagulants: Secondary | ICD-10-CM

## 2022-09-08 DIAGNOSIS — I480 Paroxysmal atrial fibrillation: Secondary | ICD-10-CM

## 2022-09-08 DIAGNOSIS — I10 Essential (primary) hypertension: Secondary | ICD-10-CM

## 2022-09-08 DIAGNOSIS — I6310 Cerebral infarction due to embolism of unspecified precerebral artery: Secondary | ICD-10-CM

## 2022-09-08 LAB — POCT INR: INR: 2.2 (ref 2.0–3.0)

## 2022-09-08 MED ORDER — ENOXAPARIN SODIUM 80 MG/0.8ML IJ SOSY: 80.0000 mg | PREFILLED_SYRINGE | Freq: Two times a day (BID) | INTRAMUSCULAR | 1 refills | Status: DC

## 2022-09-08 NOTE — Telephone Encounter (Signed)
Pt returned the call and stated he was needing his appt to do lovenox instructions for his injection. Advised that we need to get him an appt to start that process and he stated he did not have to come in for instructions last time when he done those. Advised that we will have to check his INR and give him lovenox bridge. Also, advised back in January this year he did have to come for an appt and receive instructions. He stated well I guess I will have to them. Set pt up for appt today at 1045am.   Clearance was done on 09/03/2022 and it states patient can hold warfarin for 5 days prior to procedure. Patient will need bridging with Lovenox (enoxaparin) around procedure. Also, pt will need INR the day before or morning of procedure per requesting office once procedure date is set.

## 2022-09-08 NOTE — Patient Instructions (Addendum)
Description   Continue taking 1 tablet daily. Please refer to instructions for upcoming procedure.  Repeat INR in 1 week. Coumadin Clinic [phone]     09/09/22: Last dose of warfarin.  09/10/22: No warfarin or enoxaparin (Lovenox).  09/11/22: Inject enoxaparin '80mg'$  in the fatty abdominal tissue at least 2 inches from the belly button twice a day about 12 hours apart, 8am and 8pm rotate sites. No warfarin.  09/12/22: Inject enoxaparin in the fatty tissue every 12 hours, 8am and 8pm. No warfarin.  09/13/22: Inject enoxaparin in the fatty tissue every 12 hours, 8am and 8pm. No warfarin.  09/14/22: Inject enoxaparin in the fatty tissue in the morning at 8 am (No PM dose) and report to Warfarin Appt at 11am. No warfarin.  09/15/22: Procedure Day - No enoxaparin - Resume warfarin in the evening or as directed by doctor.  09/16/22: Resume enoxaparin inject in the fatty tissue every 12 hours and take warfarin  09/17/22: Inject enoxaparin in the fatty tissue every 12 hours and take warfarin  09/18/22: Inject enoxaparin in the fatty tissue every 12 hours and take warfarin  09/19/22: Inject enoxaparin in the fatty tissue every 12 hours and take warfarin  09/20/22: Inject enoxaparin in the fatty tissue every 12 hours and take warfarin  09/21/22: Inject enoxaparin in the fatty tissue in the AM and warfarin appt to check INR.

## 2022-09-14 ENCOUNTER — Ambulatory Visit: Payer: Medicare HMO | Attending: Cardiology | Admitting: *Deleted

## 2022-09-14 DIAGNOSIS — I480 Paroxysmal atrial fibrillation: Secondary | ICD-10-CM | POA: Diagnosis not present

## 2022-09-14 DIAGNOSIS — Z7901 Long term (current) use of anticoagulants: Secondary | ICD-10-CM | POA: Diagnosis not present

## 2022-09-14 DIAGNOSIS — I6310 Cerebral infarction due to embolism of unspecified precerebral artery: Secondary | ICD-10-CM

## 2022-09-14 LAB — POCT INR: INR: 1.3 — AB (ref 2.0–3.0)

## 2022-09-14 NOTE — Patient Instructions (Addendum)
  Description   Continue holding warfarin for upcoming procedure. Please refer to instructions for upcoming procedure. Repeat INR in 1 week post procedure. [facility]in [facility] [phone]     09/14/22: Inject enoxaparin in the fatty tissue in the morning at 8 am (No PM dose) and report to Warfarin Appt at 11am. No warfarin.   09/15/22: Procedure Day - No enoxaparin - Resume warfarin in the evening or as directed by doctor.   09/16/22: Resume enoxaparin inject in the fatty tissue every 12 hours at 8am and 8pm and take warfarin.   09/17/22: Inject enoxaparin in the fatty tissue every 12 hours at 8am and 8pm and take warfarin.   09/18/22: Inject enoxaparin in the fatty tissue every 12 hours at 8am and 8pm and take warfarin.   09/19/22: Inject enoxaparin in the fatty tissue every 12 hours at 8am and 8pm and take warfarin.   09/20/22: Inject enoxaparin in the fatty tissue every 12 hours at 8am and 8pm and take warfarin.   09/21/22: Inject enoxaparin in the fatty tissue at 8am and warfarin appt to check INR.

## 2022-09-15 ENCOUNTER — Other Ambulatory Visit: Payer: Medicare HMO

## 2022-09-15 ENCOUNTER — Ambulatory Visit
Admission: RE | Admit: 2022-09-15 | Discharge: 2022-09-15 | Disposition: A | Discharge status: Home / Self Care | Payer: Medicare HMO | Source: Ambulatory Visit | Attending: Family Medicine | Admitting: Family Medicine

## 2022-09-15 DIAGNOSIS — M545 Low back pain, unspecified: Secondary | ICD-10-CM

## 2022-09-15 DIAGNOSIS — M47817 Spondylosis without myelopathy or radiculopathy, lumbosacral region: Secondary | ICD-10-CM | POA: Diagnosis not present

## 2022-09-15 MED ORDER — IOPAMIDOL (ISOVUE-M 200) INJECTION 41%
1.0000 mL | Freq: Once | INTRAMUSCULAR | Status: AC
  Administered 2022-09-15: 1 mL via EPIDURAL

## 2022-09-15 MED ORDER — METHYLPREDNISOLONE ACETATE 40 MG/ML INJ SUSP (RADIOLOG
80.0000 mg | Freq: Once | INTRAMUSCULAR | Status: AC
  Administered 2022-09-15: 80 mg via EPIDURAL

## 2022-09-15 NOTE — Discharge Instructions (Signed)
Post Procedure Spinal Discharge Instruction Sheet  You may resume a regular diet and any medications that you routinely take (including pain medications) unless otherwise noted by MD.  No driving day of procedure.  Light activity throughout the rest of the day.  Do not do any strenuous work, exercise, bending or lifting.  The day following the procedure, you can resume normal physical activity but you should refrain from exercising or physical therapy for at least three days thereafter.  You may apply ice to the injection site, 20 minutes on, 20 minutes off, as needed. Do not apply ice directly to skin.    Common Side Effects:  Headaches- take your usual medications as directed by your physician.  Increase your fluid intake.  Caffeinated beverages may be helpful.  Lie flat in bed until your headache resolves.  Restlessness or inability to sleep- you may have trouble sleeping for the next few days.  Ask your referring physician if you need any medication for sleep.  Facial flushing or redness- should subside within a few days.  Increased pain- a temporary increase in pain a day or two following your procedure is not unusual.  Take your pain medication as prescribed by your referring physician.  Leg cramps  Please contact our office at [phone] for the following symptoms: Fever greater than 100 degrees. Headaches unresolved with medication after 2-3 days. Increased swelling, pain, or redness at injection site.   Thank you for visiting [facility] today.   YOU MAY RESUME YOUR COUMADIN 12 HOURS AFTER INJECTION

## 2022-09-21 ENCOUNTER — Ambulatory Visit: Payer: Medicare HMO | Attending: Cardiology

## 2022-09-21 DIAGNOSIS — Z7901 Long term (current) use of anticoagulants: Secondary | ICD-10-CM | POA: Diagnosis not present

## 2022-09-21 DIAGNOSIS — I480 Paroxysmal atrial fibrillation: Secondary | ICD-10-CM

## 2022-09-21 DIAGNOSIS — Z5181 Encounter for therapeutic drug level monitoring: Secondary | ICD-10-CM | POA: Diagnosis not present

## 2022-09-21 DIAGNOSIS — I6310 Cerebral infarction due to embolism of unspecified precerebral artery: Secondary | ICD-10-CM | POA: Diagnosis not present

## 2022-09-21 LAB — POCT INR: INR: 1.5 — AB (ref 2.0–3.0)

## 2022-09-21 NOTE — Patient Instructions (Signed)
TAKE ANOTHER TABLET TODAY AND THEN CONTINUE 1 TABLET DAILY.  INR in 6 weeks per patient. [phone]

## 2022-09-30 ENCOUNTER — Ambulatory Visit (INDEPENDENT_AMBULATORY_CARE_PROVIDER_SITE_OTHER): Payer: Medicare HMO | Admitting: Internal Medicine

## 2022-09-30 ENCOUNTER — Encounter: Payer: Self-pay | Admitting: Internal Medicine

## 2022-09-30 VITALS — BP 160/88 | HR 62 | Temp 98.0°F | Ht 73.0 in | Wt 182.0 lb

## 2022-09-30 DIAGNOSIS — I10 Essential (primary) hypertension: Secondary | ICD-10-CM

## 2022-09-30 DIAGNOSIS — I25810 Atherosclerosis of coronary artery bypass graft(s) without angina pectoris: Secondary | ICD-10-CM | POA: Diagnosis not present

## 2022-09-30 DIAGNOSIS — N410 Acute prostatitis: Secondary | ICD-10-CM | POA: Diagnosis not present

## 2022-09-30 DIAGNOSIS — Z23 Encounter for immunization: Secondary | ICD-10-CM

## 2022-09-30 LAB — BASIC METABOLIC PANEL
BUN: 13 mg/dL (ref 6–23)
CO2: 32 mEq/L (ref 19–32)
Calcium: 9.7 mg/dL (ref 8.4–10.5)
Chloride: 95 mEq/L — ABNORMAL LOW (ref 96–112)
Creatinine, Ser: 0.83 mg/dL (ref 0.40–1.50)
GFR: 85.71 mL/min (ref >60.00)
Glucose, Bld: 112 mg/dL — ABNORMAL HIGH (ref 70–99)
Potassium: 4.6 mEq/L (ref 3.5–5.1)
Sodium: 135 mEq/L (ref 135–145)

## 2022-09-30 LAB — URINALYSIS, ROUTINE W REFLEX MICROSCOPIC
Bilirubin Urine: NEGATIVE
Hgb urine dipstick: NEGATIVE
Ketones, ur: NEGATIVE
Leukocytes,Ua: NEGATIVE
Nitrite: NEGATIVE
RBC / HPF: NONE SEEN (ref 0–?)
Specific Gravity, Urine: 1.01 (ref 1.000–1.030)
Total Protein, Urine: NEGATIVE
Urine Glucose: NEGATIVE
Urobilinogen, UA: 0.2 (ref 0.0–1.0)
pH: 7.5 (ref 5.0–8.0)

## 2022-09-30 MED ORDER — AMLODIPINE BESYLATE 5 MG PO TABS: 5.0000 mg | ORAL_TABLET | Freq: Every day | ORAL | 0 refills | Status: DC

## 2022-09-30 NOTE — Progress Notes (Signed)
Subjective:  Patient ID: [name], male    DOB: 01-29-1946  Age: 77 y.o. MRN: [id number]  CC: Hypertension and Urinary Tract Infection   HPI [name] presents for f/up -  He has completed the 2-week course of Levaquin and tells me his urinary symptoms have resolved.  He is decreasing his alcohol intake.  Outpatient Medications Prior to Visit  Medication Sig Dispense Refill   atorvastatin (LIPITOR) 20 MG tablet TAKE 1 TABLET (20 MG TOTAL) BY MOUTH DAILY. NEEDS OV FOR REFILL 90 tablet 3   chlorthalidone (HYGROTON) 25 MG tablet Take 1 tablet (25 mg total) by mouth daily. 90 tablet 0   enoxaparin (LOVENOX) 80 MG/0.8ML injection Inject 0.8 mLs (80 mg total) into the skin every 12 (twelve) hours. 16 mL 1   metoprolol tartrate (LOPRESSOR) 25 MG tablet TAKE 1 AND 1/2 TABLETS BY MOUTH TWICE DAILY 270 tablet 3   NITROSTAT 0.4 MG SL tablet Place 1 tablet under the tongue as needed for chest pain.   2   vitamin B-12 (CYANOCOBALAMIN) 1000 MCG tablet Take 1,000 mcg by mouth daily.     warfarin (COUMADIN) 5 MG tablet TAKE 1 TO 1 AND 1/2 TABLETS BY MOUTH DAILY AS DIRECTED BY COUMADIN CLINIC 120 tablet 1   No facility-administered medications prior to visit.    ROS Review of Systems  Constitutional: Negative.  Negative for chills and fever.  HENT: Negative.  Negative for sore throat and trouble swallowing.   Eyes: Negative.   Respiratory:  Negative for cough, chest tightness, shortness of breath and wheezing.   Cardiovascular:  Negative for chest pain, palpitations and leg swelling.  Gastrointestinal:  Positive for constipation. Negative for abdominal pain, blood in stool, diarrhea, nausea and vomiting.  Genitourinary: Negative.  Negative for difficulty urinating, dysuria, frequency, hematuria, scrotal swelling, testicular pain and urgency.  Musculoskeletal:  Positive for back pain. Negative for arthralgias and myalgias.  Skin: Negative.  Negative for rash.  Neurological: Negative.   Negative for dizziness and weakness.  Hematological:  Negative for adenopathy. Does not bruise/bleed easily.  Psychiatric/Behavioral: Negative.      Objective:  BP (!) 160/88 (BP Location: Right Arm, Patient Position: Sitting, Cuff Size: Large)   Pulse 62   Temp 98 F (36.7 C) (Oral)   Ht '6\' 1"'$  (1.854 m)   Wt 182 lb (82.6 kg)   SpO2 94%   BMI 24.01 kg/m   BP Readings from Last 3 Encounters:  09/30/22 (!) 160/88  09/15/22 (!) 151/85  08/25/22 (!) 152/98    Wt Readings from Last 3 Encounters:  09/30/22 182 lb (82.6 kg)  08/25/22 186 lb (84.4 kg)  04/20/22 187 lb 12.8 oz (85.2 kg)    Physical Exam Vitals reviewed.  Constitutional:      General: He is not in acute distress.    Appearance: He is ill-appearing. He is not toxic-appearing or diaphoretic.  HENT:     Mouth/Throat:     Mouth: Mucous membranes are moist.  Eyes:     General: No scleral icterus.    Conjunctiva/sclera: Conjunctivae normal.  Cardiovascular:     Rate and Rhythm: Normal rate and regular rhythm.     Heart sounds: No murmur heard. Pulmonary:     Effort: Pulmonary effort is normal.     Breath sounds: No stridor. No wheezing, rhonchi or rales.  Abdominal:     General: Abdomen is flat.     Palpations: There is no mass.     Tenderness:  There is no abdominal tenderness. There is no guarding.     Hernia: No hernia is present.  Musculoskeletal:        General: Normal range of motion.     Cervical back: Neck supple.     Right lower leg: No edema.     Left lower leg: No edema.  Lymphadenopathy:     Cervical: No cervical adenopathy.  Skin:    General: Skin is warm and dry.     Findings: No rash.  Neurological:     General: No focal deficit present.     Mental Status: He is alert.  Psychiatric:        Mood and Affect: Mood normal.        Behavior: Behavior normal.     Lab Results  Component Value Date   WBC 6.5 08/25/2022   HGB 15.6 08/25/2022   HCT 45.9 08/25/2022   PLT 211.0 08/25/2022    GLUCOSE 112 (H) 09/30/2022   CHOL 126 08/25/2022   TRIG 48.0 08/25/2022   HDL 59.00 08/25/2022   LDLCALC 58 08/25/2022   ALT 23 08/25/2022   AST 25 08/25/2022   NA 135 09/30/2022   K 4.6 09/30/2022   CL 95 (L) 09/30/2022   CREATININE 0.83 09/30/2022   BUN 13 09/30/2022   CO2 32 09/30/2022   TSH 2.38 08/25/2022   PSA 5.50 (H) 08/25/2022   INR 1.5 (A) 09/21/2022   HGBA1C 5.7 (H) 09/05/2014    DG INJECT DIAG/THERA/INC NEEDLE/CATH/PLC EPI/LUMB/SAC W/IMG  Result Date: 09/15/2022 CLINICAL DATA:  Lumbosacral spondylosis without myelopathy. Lateral right thigh pain. Epidural injection requested at L3-4. FLUOROSCOPY: Radiation Exposure Index (as provided by the fluoroscopic device): 1.10 mGy Kerma PROCEDURE: The procedure, risks, benefits, and alternatives were explained to the patient. Questions regarding the procedure were encouraged and answered. The patient understands and consents to the procedure. LUMBAR EPIDURAL INJECTION: An interlaminar approach was performed on the right at L3-4. The overlying skin was cleansed and anesthetized. A 3.5 inch 20 gauge epidural needle was advanced using loss-of-resistance technique. DIAGNOSTIC EPIDURAL INJECTION: Injection of Isovue-M 200 shows a good epidural pattern with spread above and below the level of needle placement, primarily on the right. No vascular opacification is seen. THERAPEUTIC EPIDURAL INJECTION: 80 mg of Depo-Medrol mixed with 3 mL of 1% lidocaine were instilled. The procedure was well-tolerated, and the patient was discharged thirty minutes following the injection in good condition. COMPLICATIONS: None immediate IMPRESSION: Technically successful interlaminar epidural injection on the right at L3-4. Electronically Signed   By: [name] M.D.   On: 09/15/2022 12:32    Assessment & Plan:   [name] was seen today for hypertension and urinary tract infection.  Diagnoses and all orders for this visit:  Primary hypertension- Will try to  get better control of his blood pressure. -     Basic metabolic panel; Future -     Urinalysis, Routine w reflex microscopic; Future -     amLODipine (NORVASC) 5 MG tablet; Take 1 tablet (5 mg total) by mouth daily. -     Urinalysis, Routine w reflex microscopic -     Basic metabolic panel  Acute prostatitis- Based on his symptoms and UA this has resolved. 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  Need for diphtheria-tetanus-pertussis (Tdap) vaccine -     Tdap (BOOSTRIX) 5-2.5-18.5 LF-MCG/0.5 injection; Inject 0.5 mLs into the muscle once for 1 dose.  Need for varicella vaccine -     Zoster Vaccine Adjuvanted [facility]) injection; Inject 0.5 mLs into the muscle once for 1 dose.   I am having Meagan C. Cajas start on amLODipine, Boostrix, and Shingrix. I am also having him maintain his Nitrostat, warfarin, atorvastatin, cyanocobalamin, chlorthalidone, metoprolol tartrate, and enoxaparin.  Meds ordered this encounter  Medications   amLODipine (NORVASC) 5 MG tablet    Sig: Take 1 tablet (5 mg total) by mouth daily.    Dispense:  90 tablet    Refill:  0   Tdap (BOOSTRIX) 5-2.5-18.5 LF-MCG/0.5 injection    Sig: Inject 0.5 mLs into the muscle once for 1 dose.    Dispense:  0.5 mL    Refill:  0   Zoster Vaccine Adjuvanted [facility]) injection    Sig: Inject 0.5 mLs into the muscle once for 1 dose.    Dispense:  0.5 mL    Refill:  1     Follow-up: Return in about 3 months (around 12/30/2022).  Scarlette Calico, MD

## 2022-09-30 NOTE — Patient Instructions (Signed)
Hypertension, Adult High blood pressure (hypertension) is when the force of blood pumping through the arteries is too strong. The arteries are the blood vessels that carry blood from the heart throughout the body. Hypertension forces the heart to work harder to pump blood and may cause arteries to become narrow or stiff. Untreated or uncontrolled hypertension can lead to a heart attack, heart failure, a stroke, kidney disease, and other problems. A blood pressure reading consists of a higher number over a lower number. Ideally, your blood pressure should be below 120/80. The first ("top") number is called the systolic pressure. It is a measure of the pressure in your arteries as your heart beats. The second ("bottom") number is called the diastolic pressure. It is a measure of the pressure in your arteries as the heart relaxes. What are the causes? The exact cause of this condition is not known. There are some conditions that result in high blood pressure. What increases the risk? Certain factors may make you more likely to develop high blood pressure. Some of these risk factors are under your control, including: Smoking. Not getting enough exercise or physical activity. Being overweight. Having too much fat, sugar, calories, or salt (sodium) in your diet. Drinking too much alcohol. Other risk factors include: Having a personal history of heart disease, diabetes, high cholesterol, or kidney disease. Stress. Having a family history of high blood pressure and high cholesterol. Having obstructive sleep apnea. Age. The risk increases with age. What are the signs or symptoms? High blood pressure may not cause symptoms. Very high blood pressure (hypertensive crisis) may cause: Headache. Fast or irregular heartbeats (palpitations). Shortness of breath. Nosebleed. Nausea and vomiting. Vision changes. Severe chest pain, dizziness, and seizures. How is this diagnosed? This condition is diagnosed by  measuring your blood pressure while you are seated, with your arm resting on a flat surface, your legs uncrossed, and your feet flat on the floor. The cuff of the blood pressure monitor will be placed directly against the skin of your upper arm at the level of your heart. Blood pressure should be measured at least twice using the same arm. Certain conditions can cause a difference in blood pressure between your right and left arms. If you have a high blood pressure reading during one visit or you have normal blood pressure with other risk factors, you may be asked to: Return on a different day to have your blood pressure checked again. Monitor your blood pressure at home for 1 week or longer. If you are diagnosed with hypertension, you may have other blood or imaging tests to help your health care provider understand your overall risk for other conditions. How is this treated? This condition is treated by making healthy lifestyle changes, such as eating healthy foods, exercising more, and reducing your alcohol intake. You may be referred for counseling on a healthy diet and physical activity. Your health care provider may prescribe medicine if lifestyle changes are not enough to get your blood pressure under control and if: Your systolic blood pressure is above 130. Your diastolic blood pressure is above 80. Your personal target blood pressure may vary depending on your medical conditions, your age, and other factors. Follow these instructions at home: Eating and drinking  Eat a diet that is high in fiber and potassium, and low in sodium, added sugar, and fat. An example of this eating plan is called the DASH diet. DASH stands for Dietary Approaches to Stop Hypertension. To eat this way: Eat   plenty of fresh fruits and vegetables. Try to fill one half of your plate at each meal with fruits and vegetables. Eat whole grains, such as whole-wheat pasta, brown rice, or whole-grain bread. Fill about one  fourth of your plate with whole grains. Eat or drink low-fat dairy products, such as skim milk or low-fat yogurt. Avoid fatty cuts of meat, processed or cured meats, and poultry with skin. Fill about one fourth of your plate with lean proteins, such as fish, chicken without skin, beans, eggs, or tofu. Avoid pre-made and processed foods. These tend to be higher in sodium, added sugar, and fat. Reduce your daily sodium intake. Many people with hypertension should eat less than 1,500 mg of sodium a day. Do not drink alcohol if: Your health care provider tells you not to drink. You are pregnant, may be pregnant, or are planning to become pregnant. If you drink alcohol: Limit how much you have to: 0-1 drink a day for women. 0-2 drinks a day for men. Know how much alcohol is in your drink. In the [place] one drink equals one 12 oz bottle of beer (355 mL), one 5 oz glass of wine (148 mL), or one 1 oz glass of hard liquor (44 mL). Lifestyle  Work with your health care provider to maintain a healthy body weight or to lose weight. Ask what an ideal weight is for you. Get at least 30 minutes of exercise that causes your heart to beat faster (aerobic exercise) most days of the week. Activities may include walking, swimming, or biking. Include exercise to strengthen your muscles (resistance exercise), such as Pilates or lifting weights, as part of your weekly exercise routine. Try to do these types of exercises for 30 minutes at least 3 days a week. Do not use any products that contain nicotine or tobacco. These products include cigarettes, chewing tobacco, and vaping devices, such as e-cigarettes. If you need help quitting, ask your health care provider. Monitor your blood pressure at home as told by your health care provider. Keep all follow-up visits. This is important. Medicines Take over-the-counter and prescription medicines only as told by your health care provider. Follow directions carefully. Blood  pressure medicines must be taken as prescribed. Do not skip doses of blood pressure medicine. Doing this puts you at risk for problems and can make the medicine less effective. Ask your health care provider about side effects or reactions to medicines that you should watch for. Contact a health care provider if you: Think you are having a reaction to a medicine you are taking. Have headaches that keep coming back (recurring). Feel dizzy. Have swelling in your ankles. Have trouble with your vision. Get help right away if you: Develop a severe headache or confusion. Have unusual weakness or numbness. Feel faint. Have severe pain in your chest or abdomen. Vomit repeatedly. Have trouble breathing. These symptoms may be an emergency. Get help right away. Call 911. Do not wait to see if the symptoms will go away. Do not drive yourself to the hospital. Summary Hypertension is when the force of blood pumping through your arteries is too strong. If this condition is not controlled, it may put you at risk for serious complications. Your personal target blood pressure may vary depending on your medical conditions, your age, and other factors. For most people, a normal blood pressure is less than 120/80. Hypertension is treated with lifestyle changes, medicines, or a combination of both. Lifestyle changes include losing weight, eating a healthy,   low-sodium diet, exercising more, and limiting alcohol. This information is not intended to replace advice given to you by your health care provider. Make sure you discuss any questions you have with your health care provider. Document Revised: 07/21/2021 Document Reviewed: 07/21/2021 Elsevier Patient Education  2023 Elsevier Inc.  

## 2022-10-01 MED ORDER — SHINGRIX 50 MCG/0.5ML IM SUSR: 0.5000 mL | Freq: Once | INTRAMUSCULAR | 1 refills | Status: AC

## 2022-10-01 MED ORDER — BOOSTRIX 5-2.5-18.5 LF-MCG/0.5 IM SUSP: 0.5000 mL | Freq: Once | INTRAMUSCULAR | 0 refills | Status: AC

## 2022-10-21 ENCOUNTER — Other Ambulatory Visit: Payer: Self-pay | Admitting: Cardiology

## 2022-10-21 DIAGNOSIS — I480 Paroxysmal atrial fibrillation: Secondary | ICD-10-CM

## 2022-10-21 NOTE — Telephone Encounter (Signed)
Prescription refill request received for warfarin Lov: 09/07/21 [name])  Next INR check: 11/02/22 Warfarin tablet strength: '5mg'$   Overdue for office visit. Pt has upcoming scheduled appt with Dr Stanford Breed. Refill sent.

## 2022-11-02 ENCOUNTER — Ambulatory Visit: Payer: Medicare HMO | Attending: Internal Medicine | Admitting: *Deleted

## 2022-11-02 DIAGNOSIS — I480 Paroxysmal atrial fibrillation: Secondary | ICD-10-CM | POA: Diagnosis not present

## 2022-11-02 DIAGNOSIS — Z7901 Long term (current) use of anticoagulants: Secondary | ICD-10-CM | POA: Diagnosis not present

## 2022-11-02 DIAGNOSIS — I6310 Cerebral infarction due to embolism of unspecified precerebral artery: Secondary | ICD-10-CM

## 2022-11-02 LAB — POCT INR: INR: 2 (ref 2.0–3.0)

## 2022-11-02 NOTE — Patient Instructions (Addendum)
Description   Today take 1.5 tablets of warfarin then continue taking 1 tablet daily.  Recheck INR in 6 weeks per patient. [phone]

## 2022-11-09 ENCOUNTER — Encounter: Payer: Self-pay | Admitting: Gastroenterology

## 2022-11-22 ENCOUNTER — Other Ambulatory Visit: Payer: Self-pay | Admitting: Internal Medicine

## 2022-11-22 DIAGNOSIS — I10 Essential (primary) hypertension: Secondary | ICD-10-CM

## 2022-11-24 DIAGNOSIS — H5212 Myopia, left eye: Secondary | ICD-10-CM | POA: Diagnosis not present

## 2022-11-24 DIAGNOSIS — H25013 Cortical age-related cataract, bilateral: Secondary | ICD-10-CM | POA: Diagnosis not present

## 2022-11-24 DIAGNOSIS — H2513 Age-related nuclear cataract, bilateral: Secondary | ICD-10-CM | POA: Diagnosis not present

## 2022-11-24 DIAGNOSIS — H25043 Posterior subcapsular polar age-related cataract, bilateral: Secondary | ICD-10-CM | POA: Diagnosis not present

## 2022-11-24 DIAGNOSIS — H35371 Puckering of macula, right eye: Secondary | ICD-10-CM | POA: Diagnosis not present

## 2022-12-08 ENCOUNTER — Other Ambulatory Visit: Payer: Self-pay | Admitting: Cardiology

## 2022-12-08 DIAGNOSIS — E785 Hyperlipidemia, unspecified: Secondary | ICD-10-CM

## 2022-12-08 NOTE — Telephone Encounter (Signed)
Rx(s) sent to pharmacy electronically.  

## 2022-12-14 ENCOUNTER — Ambulatory Visit: Payer: Medicare HMO | Attending: Cardiovascular Disease | Admitting: *Deleted

## 2022-12-14 DIAGNOSIS — Z7901 Long term (current) use of anticoagulants: Secondary | ICD-10-CM

## 2022-12-14 DIAGNOSIS — I480 Paroxysmal atrial fibrillation: Secondary | ICD-10-CM

## 2022-12-14 DIAGNOSIS — I6310 Cerebral infarction due to embolism of unspecified precerebral artery: Secondary | ICD-10-CM | POA: Diagnosis not present

## 2022-12-14 LAB — POCT INR: POC INR: 2.7

## 2022-12-14 NOTE — Patient Instructions (Signed)
Description   Continue taking 1 tablet daily.  Recheck INR in 6 weeks.  [phone]

## 2023-01-04 DIAGNOSIS — H25041 Posterior subcapsular polar age-related cataract, right eye: Secondary | ICD-10-CM | POA: Diagnosis not present

## 2023-01-04 DIAGNOSIS — H269 Unspecified cataract: Secondary | ICD-10-CM | POA: Diagnosis not present

## 2023-01-04 DIAGNOSIS — H2511 Age-related nuclear cataract, right eye: Secondary | ICD-10-CM | POA: Diagnosis not present

## 2023-01-04 DIAGNOSIS — H25011 Cortical age-related cataract, right eye: Secondary | ICD-10-CM | POA: Diagnosis not present

## 2023-01-04 DIAGNOSIS — H25811 Combined forms of age-related cataract, right eye: Secondary | ICD-10-CM | POA: Diagnosis not present

## 2023-01-04 NOTE — Progress Notes (Signed)
HPI: FU CAD. Also with h/o HTN, dyslipidemia, remote tobacco abuse, and posterior circulation CVA. Found to have severe three vessel coronary artery disease with preserved LVF at cath 08/29/14. He underwent CABG x 4 on 09/06/14 (LIMA to the LAD, saphenous vein graft to the ramus intermedius, saphenous vein graft to the first obtuse marginal and saphenous vein graft to the right coronary artery). Preoperative echocardiogram December 2015 showed normal LV function and grade 1 diastolic dysfunction. Preoperative carotid Dopplers December 2015 showed 1-39% bilateral stenosis. Also with PAF. Abdominal ultrasound October 2017 showed no aneurysm. Since last seen, there is no dyspnea, chest pain, palpitations or syncope.  Current Outpatient Medications  Medication Sig Dispense Refill   atorvastatin (LIPITOR) 20 MG tablet TAKE 1 TABLET (20 MG TOTAL) BY MOUTH DAILY. NEEDS OV FOR REFILL 90 tablet 2   metoprolol tartrate (LOPRESSOR) 25 MG tablet TAKE 1 AND 1/2 TABLETS BY MOUTH TWICE DAILY 270 tablet 3   NITROSTAT 0.4 MG SL tablet Place 1 tablet under the tongue as needed for chest pain.   2   vitamin B-12 (CYANOCOBALAMIN) 1000 MCG tablet Take 1,000 mcg by mouth daily.     warfarin (COUMADIN) 5 MG tablet TAKE 1 TO 1 AND 1/2 TABLETS BY MOUTH DAILY AS DIRECTED BY COUMADIN CLINIC 100 tablet 1   amLODipine (NORVASC) 5 MG tablet TAKE 1 TABLET (5 MG TOTAL) BY MOUTH DAILY. (Patient not taking: Reported on 01/17/2023) 90 tablet 0   chlorthalidone (HYGROTON) 25 MG tablet TAKE 1 TABLET (25 MG TOTAL) BY MOUTH DAILY. 90 tablet 0   enoxaparin (LOVENOX) 80 MG/0.8ML injection Inject 0.8 mLs (80 mg total) into the skin every 12 (twelve) hours. (Patient not taking: Reported on 01/17/2023) 16 mL 1   No current facility-administered medications for this visit.     Past Medical History:  Diagnosis Date   Alcohol abuse, unspecified    Anginal pain    Arthritis    in back and shoulder   H/O renal calculi    History of  kidney stones    Hyperlipidemia    preTx 114, postTx 47 LDL   Hypertension    Shortness of breath dyspnea    with exertion   Stroke    August '11 x 2: L. cerebellar, lateral medulla, ol rd lacunar infarcts;  2nd left pontine infarct.    Past Surgical History:  Procedure Laterality Date   BACK SURGERY  1986   CORONARY ARTERY BYPASS GRAFT N/A 09/06/2014   Procedure: CORONARY ARTERY BYPASS GRAFTING (CABG);  Surgeon: Kerin Perna, MD;  Location: [facility] OR;  Service: Open Heart Surgery;  Laterality: N/A;  CABG times 4, using internal mammary artery, and right leg saphenous vein harvested endoscopically   LEFT HEART CATHETERIZATION WITH CORONARY ANGIOGRAM N/A 08/29/2014   Procedure: LEFT HEART CATHETERIZATION WITH CORONARY ANGIOGRAM;  Surgeon: Runell Gess, MD;  Location: [facility] CATH LAB;  Service: Cardiovascular;  Laterality: N/A;   LUMBAR DISC SURGERY  1986   Dr. Michele Mcalpine [place]   ROTATOR CUFF REPAIR  06/10/12   at [facility] Orthopedic...left shoulder   TEE WITHOUT CARDIOVERSION N/A 09/06/2014   Procedure: TRANSESOPHAGEAL ECHOCARDIOGRAM (TEE);  Surgeon: Kerin Perna, MD;  Location: [facility] OR;  Service: Open Heart Surgery;  Laterality: N/A;   TONSILLECTOMY  1954   TONSILLECTOMY      Social History   Socioeconomic History   Marital status: Married    Spouse name: Not on file   Number of children: 2   Years  of education: 12   Highest education level: Not on file  Occupational History   Occupation: Physicist, medical - paint  Tobacco Use   Smoking status: Former    Packs/day: 1.00    Years: 15.00    Additional pack years: 0.00    Total pack years: 15.00    Types: Cigarettes    Quit date: 09/27/1978    Years since quitting: 44.3   Smokeless tobacco: Never  Vaping Use   Vaping Use: Never used  Substance and Sexual Activity   Alcohol use: Yes    Alcohol/week: 20.0 standard drinks of alcohol    Types: 20 Cans of beer per week   Drug use: Yes    Types: Marijuana    Comment: stopped a few  days ago per patientr   Sexual activity: Yes    Partners: Female  Other Topics Concern   Not on file  Social History Narrative   [employer]. [employer] for 3 years - did a tour in [place]. Married '79. 1 dtr- '68, 1 step son. 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 Marriage in good health. He reports he is very strong in his faith.    Social Determinants of Health   Financial Resource Strain: Low Risk  (04/20/2022)   Overall Financial Resource Strain (CARDIA)    Difficulty of Paying Living Expenses: Not hard at all  Food Insecurity: No Food Insecurity (04/20/2022)   Hunger Vital Sign    Worried About Running Out of Food in the Last Year: Never true    Ran Out of Food in the Last Year: Never true  Transportation Needs: No Transportation Needs (04/20/2022)   PRAPARE - Administrator, Civil Service (Medical): No    Lack of Transportation (Non-Medical): No  Physical Activity: Sufficiently Active (04/20/2022)   Exercise Vital Sign    Days of Exercise per Week: 5 days    Minutes of Exercise per Session: 30 min  Stress: No Stress Concern Present (04/20/2022)   [employer] of Occupational Health - Occupational Stress Questionnaire    Feeling of Stress : Not at all  Social Connections: Socially Integrated (04/20/2022)   Social Connection and Isolation Panel [NHANES]    Frequency of Communication with Friends and Family: More than three times a week    Frequency of Social Gatherings with Friends and Family: More than three times a week    Attends Religious Services: 1 to 4 times per year    Active Member of [employer] or Organizations: No    Attends Engineer, structural: 1 to 4 times per year    Marital Status: Married  Catering manager Violence: Not At Risk (04/20/2022)   Humiliation, Afraid, Rape, and Kick questionnaire    Fear of Current or Ex-Partner: No    Emotionally Abused: No    Physically Abused: No    Sexually Abused: No     Family History  Problem Relation Age of Onset   GI problems Mother    Heart disease Father    Hypertension Father    Heart attack Father    Cancer Neg Hx    Diabetes Neg Hx    Early death Neg Hx    Hearing loss Neg Hx    Hyperlipidemia Neg Hx    Kidney disease Neg Hx    Stroke Neg Hx    Alcohol abuse Neg Hx    Colon cancer Neg Hx  Stomach cancer Neg Hx     ROS: no fevers or chills, productive cough, hemoptysis, dysphasia, odynophagia, melena, hematochezia, dysuria, hematuria, rash, seizure activity, orthopnea, PND, pedal edema, claudication. Remaining systems are negative.  Physical Exam: Well-developed well-nourished in no acute distress.  Skin is warm and dry.  HEENT is normal.  Neck is supple.  Chest is clear to auscultation with normal expansion.  Cardiovascular exam is regular rate and rhythm.  Abdominal exam nontender or distended. No masses palpated. Extremities show no edema. neuro grossly intact  A/P  1 coronary artery disease status post coronary artery bypass graft-pt denies chest pain.  Continue statin.  No aspirin given need for anticoagulation.   2 paroxysmal atrial fibrillation-patient is in sinus rhythm on exam today.  Will continue beta-blocker for rate control if atrial fibrillation recurs.  Continue Coumadin with goal INR 2-3.  He is not on a DOAC due to expense.   3 hypertension-blood pressure elevated; add amlodipine 5 mg daily.  Goal systolic blood pressure 130 and diastolic 85.   4 hyperlipidemia-continue statin.   5 previous cryptogenic stroke  Eric MillersBrian Tod Abrahamsen, MD

## 2023-01-06 ENCOUNTER — Other Ambulatory Visit: Payer: Self-pay | Admitting: Internal Medicine

## 2023-01-06 DIAGNOSIS — I10 Essential (primary) hypertension: Secondary | ICD-10-CM

## 2023-01-06 DIAGNOSIS — I25810 Atherosclerosis of coronary artery bypass graft(s) without angina pectoris: Secondary | ICD-10-CM

## 2023-01-17 ENCOUNTER — Ambulatory Visit (INDEPENDENT_AMBULATORY_CARE_PROVIDER_SITE_OTHER): Payer: Medicare HMO | Admitting: Pharmacist

## 2023-01-17 ENCOUNTER — Ambulatory Visit: Payer: Medicare HMO | Attending: Student | Admitting: Cardiology

## 2023-01-17 ENCOUNTER — Encounter: Payer: Self-pay | Admitting: Cardiology

## 2023-01-17 VITALS — BP 136/96 | HR 62 | Ht 73.0 in | Wt 187.0 lb

## 2023-01-17 DIAGNOSIS — I6310 Cerebral infarction due to embolism of unspecified precerebral artery: Secondary | ICD-10-CM | POA: Diagnosis not present

## 2023-01-17 DIAGNOSIS — E7800 Pure hypercholesterolemia, unspecified: Secondary | ICD-10-CM | POA: Diagnosis not present

## 2023-01-17 DIAGNOSIS — I480 Paroxysmal atrial fibrillation: Secondary | ICD-10-CM

## 2023-01-17 DIAGNOSIS — I25810 Atherosclerosis of coronary artery bypass graft(s) without angina pectoris: Secondary | ICD-10-CM | POA: Diagnosis not present

## 2023-01-17 DIAGNOSIS — I10 Essential (primary) hypertension: Secondary | ICD-10-CM | POA: Diagnosis not present

## 2023-01-17 DIAGNOSIS — Z7901 Long term (current) use of anticoagulants: Secondary | ICD-10-CM | POA: Diagnosis not present

## 2023-01-17 DIAGNOSIS — I2510 Atherosclerotic heart disease of native coronary artery without angina pectoris: Secondary | ICD-10-CM | POA: Diagnosis not present

## 2023-01-17 DIAGNOSIS — Z951 Presence of aortocoronary bypass graft: Secondary | ICD-10-CM | POA: Diagnosis not present

## 2023-01-17 LAB — POCT INR: INR: 2.9 (ref 2.0–3.0)

## 2023-01-17 MED ORDER — AMLODIPINE BESYLATE 5 MG PO TABS
5.0000 mg | ORAL_TABLET | Freq: Every day | ORAL | 3 refills | Status: DC
Start: 2023-01-17 — End: 2023-12-27

## 2023-01-17 NOTE — Patient Instructions (Signed)
Medication Instructions:   START AMLODIPINE 5 MG ONCE DAILY  *If you need a refill on your cardiac medications before your next appointment, please call your pharmacy*   Follow-Up: At [facility], you and your health needs are our priority.  As part of our continuing mission to provide you with exceptional heart care, we have created designated Provider Care Teams.  These Care Teams include your primary Cardiologist (physician) and Advanced Practice Providers (APPs -  Physician Assistants and Nurse Practitioners) who all work together to provide you with the care you need, when you need it.  We recommend signing up for the patient portal called "MyChart".  Sign up information is provided on this After Visit Summary.  MyChart is used to connect with patients for Virtual Visits (Telemedicine).  Patients are able to view lab/test results, encounter notes, upcoming appointments, etc.  Non-urgent messages can be sent to your provider as well.   To learn more about what you can do with MyChart, go to [email or web address].    Your next appointment:   12 month(s)  Provider:   BRIAN CRENSHAW MD    

## 2023-01-17 NOTE — Patient Instructions (Signed)
Description   Continue taking 1 tablet daily.  Recheck INR in 6 weeks.  [phone]     

## 2023-01-18 DIAGNOSIS — Z961 Presence of intraocular lens: Secondary | ICD-10-CM | POA: Diagnosis not present

## 2023-01-18 DIAGNOSIS — H25012 Cortical age-related cataract, left eye: Secondary | ICD-10-CM | POA: Diagnosis not present

## 2023-01-18 DIAGNOSIS — H25042 Posterior subcapsular polar age-related cataract, left eye: Secondary | ICD-10-CM | POA: Diagnosis not present

## 2023-01-18 DIAGNOSIS — H269 Unspecified cataract: Secondary | ICD-10-CM | POA: Diagnosis not present

## 2023-01-18 DIAGNOSIS — H25812 Combined forms of age-related cataract, left eye: Secondary | ICD-10-CM | POA: Diagnosis not present

## 2023-01-18 DIAGNOSIS — H2512 Age-related nuclear cataract, left eye: Secondary | ICD-10-CM | POA: Diagnosis not present

## 2023-01-19 ENCOUNTER — Other Ambulatory Visit: Payer: Self-pay | Admitting: Cardiology

## 2023-01-19 DIAGNOSIS — I480 Paroxysmal atrial fibrillation: Secondary | ICD-10-CM

## 2023-02-17 ENCOUNTER — Ambulatory Visit: Payer: Medicare HMO | Admitting: Family Medicine

## 2023-02-23 DIAGNOSIS — Z961 Presence of intraocular lens: Secondary | ICD-10-CM | POA: Diagnosis not present

## 2023-02-23 DIAGNOSIS — H35373 Puckering of macula, bilateral: Secondary | ICD-10-CM | POA: Diagnosis not present

## 2023-02-28 ENCOUNTER — Ambulatory Visit: Payer: Medicare HMO | Attending: Cardiology

## 2023-02-28 DIAGNOSIS — Z7901 Long term (current) use of anticoagulants: Secondary | ICD-10-CM | POA: Diagnosis not present

## 2023-02-28 DIAGNOSIS — I6310 Cerebral infarction due to embolism of unspecified precerebral artery: Secondary | ICD-10-CM

## 2023-02-28 DIAGNOSIS — I480 Paroxysmal atrial fibrillation: Secondary | ICD-10-CM | POA: Diagnosis not present

## 2023-02-28 LAB — POCT INR: INR: 3.1 — AB (ref 2.0–3.0)

## 2023-02-28 NOTE — Patient Instructions (Signed)
Continue taking 1 tablet daily.  Recheck INR in 8 weeks.  [phone]

## 2023-04-04 DIAGNOSIS — Z0100 Encounter for examination of eyes and vision without abnormal findings: Secondary | ICD-10-CM | POA: Diagnosis not present

## 2023-04-11 ENCOUNTER — Encounter: Payer: Self-pay | Admitting: Family Medicine

## 2023-04-11 ENCOUNTER — Ambulatory Visit: Payer: Medicare HMO | Admitting: Family Medicine

## 2023-04-11 VITALS — BP 136/72 | HR 80 | Temp 97.6°F | Resp 20 | Ht 73.0 in | Wt 182.0 lb

## 2023-04-11 DIAGNOSIS — R600 Localized edema: Secondary | ICD-10-CM | POA: Diagnosis not present

## 2023-04-11 DIAGNOSIS — L819 Disorder of pigmentation, unspecified: Secondary | ICD-10-CM | POA: Diagnosis not present

## 2023-04-11 NOTE — Progress Notes (Signed)
Assessment & Plan:  1-2. Edema of right foot/Discoloration of skin Discussed peripheral vascular disease. Patient declined ABIs. He is already taking a statin and anticoagulant.  Written education provided on edema and PVD.  Encouraged to elevate his feet throughout the day while sitting.   Follow up plan: Return if symptoms worsen or fail to improve.  Deliah Boston, MSN, APRN, FNP-C  Subjective:  HPI: [name] is a 77 y.o. male presenting on 04/11/2023 for right foot swelling (No pain - this started about 3 weeks ago /(Wife thinks it is discolored) )  Patient reports he is here to check on his right foot at his wife's request. She believes it is swollen and discolored. He denies any pain. 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    ROS: Negative unless specifically indicated above in HPI.   Relevant past medical history reviewed and updated as indicated.   Allergies and medications reviewed and updated.   Current Outpatient Medications:    amLODipine (NORVASC) 5 MG tablet, Take 1 tablet (5 mg total) by mouth daily., Disp: 90 tablet, Rfl: 3   atorvastatin (LIPITOR) 20 MG tablet, TAKE 1 TABLET (20 MG TOTAL) BY MOUTH DAILY. NEEDS OV FOR REFILL, Disp: 90 tablet, Rfl: 2   metoprolol tartrate (LOPRESSOR) 25 MG tablet, TAKE 1 AND 1/2 TABLETS BY MOUTH TWICE DAILY, Disp: 270 tablet, Rfl: 3   vitamin B-12 (CYANOCOBALAMIN) 1000 MCG tablet, Take 1,000 mcg by mouth daily., Disp: , Rfl:    warfarin (COUMADIN) 5 MG tablet, Take 1 tablet by mouth once daily as directed by anticoagulation clinic, Disp: 100 tablet, Rfl: 1   chlorthalidone (HYGROTON) 25 MG tablet, TAKE 1 TABLET (25 MG TOTAL) BY MOUTH DAILY., Disp: 90 tablet, Rfl: 0   NITROSTAT 0.4 MG SL tablet, Place 1 tablet under the tongue as needed for chest pain.  (Patient not taking: Reported on 04/11/2023), Disp: , Rfl: 2  No Known Allergies  Objective:   BP 136/72   Pulse 80   Temp 97.6 F (36.4 C)   Resp 20   Ht  6\' 1"  (1.854 m)   Wt 182 lb (82.6 kg)   BMI 24.01 kg/m    Physical Exam Vitals reviewed.  Constitutional:      General: He is not in acute distress.    Appearance: Normal appearance. He is not ill-appearing, toxic-appearing or diaphoretic.  HENT:     Head: Normocephalic and atraumatic.  Eyes:     General: No scleral icterus.       Right eye: No discharge.        Left eye: No discharge.     Conjunctiva/sclera: Conjunctivae normal.  Cardiovascular:     Rate and Rhythm: Normal rate.     Pulses:          Dorsalis pedis pulses are 1+ on the right side and 1+ on the left side.  Pulmonary:     Effort: Pulmonary effort is normal. No respiratory distress.  Musculoskeletal:        General: Normal range of motion.     Cervical back: Normal range of motion.     Right lower leg: 1+ Edema present.     Left lower leg: No edema.  Feet:     Right foot:     Skin integrity: Skin integrity normal.     Left foot:     Skin integrity: Skin integrity normal.     Comments: Right foot more blue in color than  the left.  Skin:    General: Skin is warm and dry.  Neurological:     Mental Status: He is alert and oriented to person, place, and time. Mental status is at baseline.  Psychiatric:        Mood and Affect: Mood normal.        Behavior: Behavior normal.        Thought Content: Thought content normal.        Judgment: Judgment normal.

## 2023-04-25 ENCOUNTER — Ambulatory Visit: Payer: Medicare HMO | Attending: Cardiology

## 2023-04-25 DIAGNOSIS — Z7901 Long term (current) use of anticoagulants: Secondary | ICD-10-CM | POA: Diagnosis not present

## 2023-04-25 DIAGNOSIS — I6310 Cerebral infarction due to embolism of unspecified precerebral artery: Secondary | ICD-10-CM

## 2023-04-25 DIAGNOSIS — I480 Paroxysmal atrial fibrillation: Secondary | ICD-10-CM | POA: Diagnosis not present

## 2023-04-25 LAB — POCT INR: INR: 1.9 — AB (ref 2.0–3.0)

## 2023-04-25 NOTE — Patient Instructions (Signed)
TAKE 1.5 TABLETS TODAY THEN Continue taking 1 tablet daily.  Recheck INR in 8 weeks.  [phone]

## 2023-06-15 ENCOUNTER — Other Ambulatory Visit: Payer: Self-pay | Admitting: Cardiology

## 2023-06-15 DIAGNOSIS — E785 Hyperlipidemia, unspecified: Secondary | ICD-10-CM

## 2023-06-20 ENCOUNTER — Ambulatory Visit: Payer: Medicare HMO | Attending: Cardiology

## 2023-06-20 DIAGNOSIS — I6310 Cerebral infarction due to embolism of unspecified precerebral artery: Secondary | ICD-10-CM | POA: Diagnosis not present

## 2023-06-20 DIAGNOSIS — I480 Paroxysmal atrial fibrillation: Secondary | ICD-10-CM | POA: Diagnosis not present

## 2023-06-20 DIAGNOSIS — Z7901 Long term (current) use of anticoagulants: Secondary | ICD-10-CM

## 2023-06-20 LAB — POCT INR: INR: 2.5 (ref 2.0–3.0)

## 2023-06-20 NOTE — Patient Instructions (Signed)
Continue taking 1 tablet daily.  Recheck INR in 8 weeks.  [phone]

## 2023-08-15 ENCOUNTER — Ambulatory Visit: Payer: Medicare HMO | Attending: Cardiology

## 2023-08-15 DIAGNOSIS — Z7901 Long term (current) use of anticoagulants: Secondary | ICD-10-CM

## 2023-08-15 DIAGNOSIS — I6310 Cerebral infarction due to embolism of unspecified precerebral artery: Secondary | ICD-10-CM | POA: Diagnosis not present

## 2023-08-15 DIAGNOSIS — I480 Paroxysmal atrial fibrillation: Secondary | ICD-10-CM | POA: Diagnosis not present

## 2023-08-15 LAB — POCT INR: INR: 2.5 (ref 2.0–3.0)

## 2023-08-15 NOTE — Patient Instructions (Signed)
Continue taking 1 tablet daily.  Recheck INR in 8 weeks.  [phone]

## 2023-08-19 ENCOUNTER — Ambulatory Visit: Payer: Medicare HMO | Admitting: Family Medicine

## 2023-08-19 VITALS — BP 146/82 | HR 65 | Ht 73.0 in | Wt 182.0 lb

## 2023-08-19 DIAGNOSIS — M21371 Foot drop, right foot: Secondary | ICD-10-CM

## 2023-08-19 DIAGNOSIS — M545 Low back pain, unspecified: Secondary | ICD-10-CM | POA: Diagnosis not present

## 2023-08-19 DIAGNOSIS — R350 Frequency of micturition: Secondary | ICD-10-CM

## 2023-08-19 LAB — BASIC METABOLIC PANEL
BUN: 9 mg/dL (ref 6–23)
CO2: 27 meq/L (ref 19–32)
Calcium: 9.4 mg/dL (ref 8.4–10.5)
Chloride: 102 meq/L (ref 96–112)
Creatinine, Ser: 0.62 mg/dL (ref 0.40–1.50)
GFR: 93.02 mL/min (ref >60.00)
Glucose, Bld: 98 mg/dL (ref 70–99)
Potassium: 4.3 meq/L (ref 3.5–5.1)
Sodium: 138 meq/L (ref 135–145)

## 2023-08-19 LAB — URINALYSIS, ROUTINE W REFLEX MICROSCOPIC
Hgb urine dipstick: NEGATIVE
Ketones, ur: 15 — AB
Leukocytes,Ua: NEGATIVE
Nitrite: POSITIVE — AB
Specific Gravity, Urine: 1.02 (ref 1.000–1.030)
Urine Glucose: NEGATIVE
Urobilinogen, UA: 0.2 (ref 0.0–1.0)
pH: 7 (ref 5.0–8.0)

## 2023-08-19 LAB — PSA: PSA: 4.26 ng/mL — ABNORMAL HIGH (ref 0.10–4.00)

## 2023-08-19 MED ORDER — TIZANIDINE HCL 2 MG PO TABS: 2.0000 mg | ORAL_TABLET | Freq: Three times a day (TID) | ORAL | 1 refills | Status: DC | PRN

## 2023-08-19 NOTE — Progress Notes (Signed)
[name], PhD, LAT, ATC acting as a scribe for [name], MD.  [name] is a 77 y.o. male who presents to [facility] today for LBP. Pt was previously seen by Dr. Katrinka Blazing on 04/15/22 for R foot drop. Last lumbar ESI was on 09/15/22.  Today, pt c/o LBP ongoing since yesterday. He went to the store and had to lift some heavy items from the underneath of the cart. Then he did some chores at home. Hx of back surgery and stroke. He also reports increased frequency urinating, esp at night. Pt's wife is concerned about a UTI. Pt locates pain to midline to L-side of his low back. Pain is sharp, causing him to go to his knees.   Radiating pain: no LE numbness/tingling: no LE weakness: yes- pre-existing Aggravates: too acute Treatments tried: Tylenol, cream Positive for urinary frequency.  Negative for urinary continence or bowel incontinence or saddle anesthesia.  Dx testing: 03/21/22 L-spine MRI  03/10/22 L-spine XR  Pertinent review of systems: No fevers or chills.  Positive for urinary frequency without urgency or dysuria.  Relevant historical information: History of stroke and CABG.  Chronic right foot drop.   Exam:  BP (!) 146/82   Pulse 65   Ht 6\' 1"  (1.854 m)   Wt 182 lb (82.6 kg)   SpO2 97%   BMI 24.01 kg/m  General: Well Developed, well nourished, and in no acute distress.   MSK:  L-spine: Normal appearing Nontender palpation spinal midline.  Tender palpation left lumbar paraspinal musculature. Decreased lumbar motion. Lower extremity strength is decreased right hip flexion knee extension rated 4+/5 and right foot dorsiflexion rated 4/5. Left lower extremity strength is intact.   Lab and Radiology Results  EXAM: MRI LUMBAR SPINE WITHOUT CONTRAST   TECHNIQUE: Multiplanar, multisequence MR imaging of the lumbar spine was performed. No intravenous contrast was administered.   COMPARISON:  None Available.    FINDINGS: Segmentation:  5 lumbar type vertebrae   Alignment:  Mild levocurvature.   Vertebrae: No fracture, evidence of discitis, or bone lesion. Mild discogenic endplate edema at Z6-1   Conus medullaris and cauda equina: Conus extends to the L1 level. Conus and cauda equina appear normal.   Paraspinal and other soft tissues: Negative for perispinal mass or inflammation   Disc levels:   T12- L1: Mild disc narrowing with spondylosis   L1-L2: Mild disc narrowing with spondylosis   L2-L3: Disc narrowing and endplate ridging.   L3-L4: Disc narrowing and endplate ridging.   L4-L5: Disc narrowing and endplate ridging.  Minor facet spurring   L5-S1:Disc narrowing with endplate and facet spurring.   IMPRESSION: 1. Ordinary and generalized degenerative change in the lumbar spine. Mild discogenic endplate edema is present at L3-4. 2. No neural impingement.     Electronically Signed   By: Tiburcio Pea M.D.   On: 03/22/2022 13:15 I, [name], personally (independently) visualized and performed the interpretation of the images attached in this note.     Assessment and Plan: 77 y.o. male with acute left low back pain.  This is thought to be a muscle spasm and dysfunction of the left lumbar paraspinal musculature quadratus lumborum or multifidus  muscle groups.  Plan for heating pad and percussive massage, deep tissue massage or even dry needling.  Consider physical therapy referral.  He notes that he had good relief with a previous epidural steroid injection about a year ago.  We could repeat that  but he need to stop warfarin for it and he wants to wait on it which makes sense.  Additionally will use tizanidine.  He does have some urinary frequency.  I think this is probably unrelated to his back pain.  This seems to be an acute exacerbation of a chronic problem.  Will recheck basic metabolic panel PSA and a urinalysis.   PDMP not reviewed this encounter. Orders Placed This  Encounter  Procedures   Basic Metabolic Panel (BMET)    Standing Status:   Future    Number of Occurrences:   1    Standing Expiration Date:   08/18/2024   PSA    Standing Status:   Future    Number of Occurrences:   1    Standing Expiration Date:   08/18/2024   Urinalysis    Standing Status:   Future    Number of Occurrences:   1    Standing Expiration Date:   08/18/2024   Meds ordered this encounter  Medications   tiZANidine (ZANAFLEX) 2 MG tablet    Sig: Take 1-2 tablets (2-4 mg total) by mouth every 8 (eight) hours as needed.    Dispense:  20 tablet    Refill:  1     Discussed warning signs or symptoms. Please see discharge instructions. Patient expresses understanding.   The above documentation has been reviewed and is accurate and complete [name], M.D.

## 2023-08-19 NOTE — Patient Instructions (Addendum)
Thank you for coming in today.   I think this is muscle spasm.   Try a heating pad.   Please get labs today before you leave    Try a muscle relaxer if needed   I can order PT and a back injection if needed. Let me know.

## 2023-08-22 NOTE — Progress Notes (Signed)
It is tough to tell if you have a urinary tract infection based on the way the urine looks.  The prostate lab is elevated but less so than it was last year.  Blood chemistry looks okay.  How are you feeling?

## 2023-09-10 ENCOUNTER — Other Ambulatory Visit: Payer: Self-pay | Admitting: Cardiology

## 2023-09-10 DIAGNOSIS — E785 Hyperlipidemia, unspecified: Secondary | ICD-10-CM

## 2023-09-10 DIAGNOSIS — I10 Essential (primary) hypertension: Secondary | ICD-10-CM

## 2023-09-10 DIAGNOSIS — I480 Paroxysmal atrial fibrillation: Secondary | ICD-10-CM

## 2023-09-12 ENCOUNTER — Other Ambulatory Visit: Payer: Self-pay

## 2023-09-12 DIAGNOSIS — E785 Hyperlipidemia, unspecified: Secondary | ICD-10-CM

## 2023-09-12 MED ORDER — ATORVASTATIN CALCIUM 20 MG PO TABS
20.0000 mg | ORAL_TABLET | Freq: Every day | ORAL | 0 refills | Status: DC
Start: 2023-09-12 — End: 2023-12-06

## 2023-09-23 ENCOUNTER — Telehealth: Payer: Self-pay

## 2023-09-23 ENCOUNTER — Other Ambulatory Visit: Payer: Self-pay | Admitting: Internal Medicine

## 2023-09-23 NOTE — Telephone Encounter (Signed)
When do you want to see him considering his wife is covid positive and he thinks he has it too ?

## 2023-09-23 NOTE — Telephone Encounter (Signed)
Copied from CRM [id number]. Topic: Clinical - Medical Advice >> Sep 23, 2023 12:43 PM Corin V wrote:  Reason for CRM: Patient's wife called in as she tested positive for COVID today. Patient has been sneezing and coughing. They did not have a second test for him to take one. They are not sure if he needs to take a test or what he can do to treat symptoms.

## 2023-09-23 NOTE — Telephone Encounter (Signed)
Unable to reach patient . Lmtrc

## 2023-09-23 NOTE — Telephone Encounter (Signed)
Please advise 

## 2023-09-26 NOTE — Telephone Encounter (Signed)
Unable to reach patient. LMTRC  

## 2023-10-10 ENCOUNTER — Ambulatory Visit: Payer: Medicare HMO | Attending: Cardiology

## 2023-10-10 DIAGNOSIS — I6310 Cerebral infarction due to embolism of unspecified precerebral artery: Secondary | ICD-10-CM

## 2023-10-10 DIAGNOSIS — I480 Paroxysmal atrial fibrillation: Secondary | ICD-10-CM

## 2023-10-10 DIAGNOSIS — Z7901 Long term (current) use of anticoagulants: Secondary | ICD-10-CM

## 2023-10-10 LAB — POCT INR: INR: 2.8 (ref 2.0–3.0)

## 2023-10-10 NOTE — Patient Instructions (Signed)
Continue taking 1 tablet daily.  Recheck INR in 8 weeks.  [phone]

## 2023-12-05 ENCOUNTER — Ambulatory Visit: Payer: Medicare HMO | Attending: Cardiology

## 2023-12-05 DIAGNOSIS — I480 Paroxysmal atrial fibrillation: Secondary | ICD-10-CM

## 2023-12-05 DIAGNOSIS — Z7901 Long term (current) use of anticoagulants: Secondary | ICD-10-CM

## 2023-12-05 DIAGNOSIS — I6310 Cerebral infarction due to embolism of unspecified precerebral artery: Secondary | ICD-10-CM | POA: Diagnosis not present

## 2023-12-05 LAB — POCT INR: INR: 1.9 — AB (ref 2.0–3.0)

## 2023-12-05 NOTE — Patient Instructions (Addendum)
 Take 1.5 tablets today only then Continue taking 1 tablet daily.  Recheck INR in 8 weeks per patient.  [phone]

## 2023-12-06 ENCOUNTER — Other Ambulatory Visit: Payer: Self-pay | Admitting: Cardiology

## 2023-12-06 DIAGNOSIS — E785 Hyperlipidemia, unspecified: Secondary | ICD-10-CM

## 2023-12-27 ENCOUNTER — Ambulatory Visit (INDEPENDENT_AMBULATORY_CARE_PROVIDER_SITE_OTHER): Payer: Medicare HMO | Admitting: Internal Medicine

## 2023-12-27 ENCOUNTER — Encounter: Payer: Self-pay | Admitting: Internal Medicine

## 2023-12-27 VITALS — BP 142/78 | HR 67 | Temp 97.7°F | Resp 16 | Ht 73.0 in | Wt 183.6 lb

## 2023-12-27 DIAGNOSIS — Z0001 Encounter for general adult medical examination with abnormal findings: Secondary | ICD-10-CM

## 2023-12-27 DIAGNOSIS — E785 Hyperlipidemia, unspecified: Secondary | ICD-10-CM

## 2023-12-27 DIAGNOSIS — I480 Paroxysmal atrial fibrillation: Secondary | ICD-10-CM | POA: Diagnosis not present

## 2023-12-27 DIAGNOSIS — R001 Bradycardia, unspecified: Secondary | ICD-10-CM | POA: Diagnosis not present

## 2023-12-27 DIAGNOSIS — I25810 Atherosclerosis of coronary artery bypass graft(s) without angina pectoris: Secondary | ICD-10-CM

## 2023-12-27 DIAGNOSIS — Z0000 Encounter for general adult medical examination without abnormal findings: Secondary | ICD-10-CM | POA: Diagnosis not present

## 2023-12-27 DIAGNOSIS — I10 Essential (primary) hypertension: Secondary | ICD-10-CM | POA: Diagnosis not present

## 2023-12-27 DIAGNOSIS — F10188 Alcohol abuse with other alcohol-induced disorder: Secondary | ICD-10-CM | POA: Diagnosis not present

## 2023-12-27 LAB — HEPATIC FUNCTION PANEL
ALT: 36 U/L (ref 0–53)
AST: 37 U/L (ref 0–37)
Albumin: 4.4 g/dL (ref 3.5–5.2)
Alkaline Phosphatase: 58 U/L (ref 39–117)
Bilirubin, Direct: 0.2 mg/dL (ref 0.0–0.3)
Total Bilirubin: 1 mg/dL (ref 0.2–1.2)
Total Protein: 7.6 g/dL (ref 6.0–8.3)

## 2023-12-27 LAB — CBC WITH DIFFERENTIAL/PLATELET
Basophils Absolute: 0 10*3/uL (ref 0.0–0.1)
Basophils Relative: 0.4 % (ref 0.0–3.0)
Eosinophils Absolute: 0.5 10*3/uL (ref 0.0–0.7)
Eosinophils Relative: 6.7 % — ABNORMAL HIGH (ref 0.0–5.0)
HCT: 44.9 % (ref 39.0–52.0)
Hemoglobin: 15 g/dL (ref 13.0–17.0)
Lymphocytes Relative: 30.3 % (ref 12.0–46.0)
Lymphs Abs: 2.2 10*3/uL (ref 0.7–4.0)
MCHC: 33.3 g/dL (ref 30.0–36.0)
MCV: 94.2 fl (ref 78.0–100.0)
Monocytes Absolute: 0.7 10*3/uL (ref 0.1–1.0)
Monocytes Relative: 10.1 % (ref 3.0–12.0)
Neutro Abs: 3.8 10*3/uL (ref 1.4–7.7)
Neutrophils Relative %: 52.5 % (ref 43.0–77.0)
Platelets: 225 10*3/uL (ref 150.0–400.0)
RBC: 4.77 Mil/uL (ref 4.22–5.81)
RDW: 13.5 % (ref 11.5–15.5)
WBC: 7.3 10*3/uL (ref 4.0–10.5)

## 2023-12-27 LAB — LIPID PANEL
Cholesterol: 128 mg/dL (ref 0–200)
HDL: 59.2 mg/dL (ref >39.00)
LDL Cholesterol: 56 mg/dL (ref 0–99)
NonHDL: 68.61
Total CHOL/HDL Ratio: 2
Triglycerides: 62 mg/dL (ref 0.0–149.0)
VLDL: 12.4 mg/dL (ref 0.0–40.0)

## 2023-12-27 LAB — URINALYSIS, ROUTINE W REFLEX MICROSCOPIC
Bilirubin Urine: NEGATIVE
Hgb urine dipstick: NEGATIVE
Leukocytes,Ua: NEGATIVE
Nitrite: NEGATIVE
Specific Gravity, Urine: 1.02 (ref 1.000–1.030)
Total Protein, Urine: NEGATIVE
Urine Glucose: NEGATIVE
Urobilinogen, UA: 0.2 (ref 0.0–1.0)
pH: 6 (ref 5.0–8.0)

## 2023-12-27 LAB — BASIC METABOLIC PANEL WITH GFR
BUN: 12 mg/dL (ref 6–23)
CO2: 27 meq/L (ref 19–32)
Calcium: 9.3 mg/dL (ref 8.4–10.5)
Chloride: 102 meq/L (ref 96–112)
Creatinine, Ser: 0.67 mg/dL (ref 0.40–1.50)
GFR: 90.64 mL/min (ref >60.00)
Glucose, Bld: 109 mg/dL — ABNORMAL HIGH (ref 70–99)
Potassium: 4.7 meq/L (ref 3.5–5.1)
Sodium: 137 meq/L (ref 135–145)

## 2023-12-27 LAB — TSH: TSH: 2.38 u[IU]/mL (ref 0.35–5.50)

## 2023-12-27 MED ORDER — AMLODIPINE BESYLATE 5 MG PO TABS: 5.0000 mg | ORAL_TABLET | Freq: Every day | ORAL | 1 refills | Status: DC

## 2023-12-27 NOTE — Progress Notes (Signed)
 Subjective:  Patient ID: [name], male    DOB: 05-11-1946  Age: 78 y.o. MRN: [id number]  CC: Annual Exam, Hypertension, and Hyperlipidemia   HPI [name] presents for a CPX and f/up ----  Discussed the use of AI scribe software for clinical note transcription with the patient, who gave verbal consent to proceed.  History of Present Illness   [name] is a 78 year old male with hypertension and a history of stroke who presents for routine follow-up.  He has a history of stroke, resulting in foot drop and a limp on his right side. This condition has progressively worsened over the past four years, affecting his balance and ability to walk. He has undergone rehabilitation and nerve conduction studies and was fitted for a brace, but these interventions have not improved his condition. He is concerned about the impact of the limp on his lifestyle, stating that he no longer goes for walks but uses exercise machines a couple of times a week. He is seeking answers regarding the cause and potential treatment options for his foot drop.  He monitors his blood pressure at home, which is typically around 140/80 mmHg, although it is often elevated during office visits due to 'white coat syndrome'. He takes Lipitor, Lopressor, Coumadin, and another blood pressure medication he refers to as Norstat, daily. No symptoms of high blood pressure, bleeding, or bruising are reported.  He is generally feeling well and remains active. No chest pain, shortness of breath, dizziness, or lightheadedness. He denies any swelling in his legs or feet and reports no current pain. He recalls experiencing muscle spasms in his back last November, for which he was prescribed medication, but he has only taken two doses.  He does not smoke or drink alcohol.       Outpatient Medications Prior to Visit  Medication Sig Dispense Refill   atorvastatin (LIPITOR) 20 MG tablet TAKE 1 TABLET (20 MG TOTAL) BY MOUTH  DAILY. NEEDS OV FOR REFILL 90 tablet 0   metoprolol tartrate (LOPRESSOR) 25 MG tablet TAKE 1 AND 1/2 TABLETS BY MOUTH TWICE DAILY 270 tablet 1   NITROSTAT 0.4 MG SL tablet Place 1 tablet under the tongue as needed for chest pain.  2   vitamin B-12 (CYANOCOBALAMIN) 1000 MCG tablet Take 1,000 mcg by mouth daily.     warfarin (COUMADIN) 5 MG tablet TAKE 1 TABLET BY MOUTH ONCE DAILY AS DIRECTED BY ANTICOAGULATION CLINIC 100 tablet 1   amLODipine (NORVASC) 5 MG tablet Take 1 tablet (5 mg total) by mouth daily. 90 tablet 3   chlorthalidone (HYGROTON) 25 MG tablet TAKE 1 TABLET (25 MG TOTAL) BY MOUTH DAILY. 90 tablet 0   tiZANidine (ZANAFLEX) 2 MG tablet Take 1-2 tablets (2-4 mg total) by mouth every 8 (eight) hours as needed. 20 tablet 1   No facility-administered medications prior to visit.    ROS Review of Systems  Constitutional: Negative.  Negative for appetite change, chills, diaphoresis, fatigue and fever.  HENT: Negative.    Eyes: Negative.   Respiratory: Negative.  Negative for cough, chest tightness, shortness of breath and wheezing.   Cardiovascular:  Negative for chest pain, palpitations and leg swelling.  Gastrointestinal: Negative.  Negative for abdominal pain, blood in stool, constipation, diarrhea, nausea and vomiting.  Genitourinary: Negative.  Negative for difficulty urinating.  Musculoskeletal:  Positive for gait problem.  Neurological:  Positive for weakness. Negative for dizziness.  Hematological:  Negative for adenopathy. Does not bruise/bleed  easily.  Psychiatric/Behavioral:  Positive for confusion and decreased concentration.     Objective:  BP (!) 142/78 (BP Location: Left Arm, Patient Position: Sitting, Cuff Size: Normal)   Pulse 67   Temp 97.7 F (36.5 C) (Oral)   Resp 16   Ht 6\' 1"  (1.854 m)   Wt 183 lb 9.6 oz (83.3 kg)   SpO2 96%   BMI 24.22 kg/m   BP Readings from Last 3 Encounters:  12/27/23 (!) 142/78  08/19/23 (!) 146/82  04/11/23 136/72    Wt  Readings from Last 3 Encounters:  12/27/23 183 lb 9.6 oz (83.3 kg)  08/19/23 182 lb (82.6 kg)  04/11/23 182 lb (82.6 kg)    Physical Exam Vitals reviewed.  Constitutional:      Appearance: Normal appearance.  HENT:     Nose: Nose normal.     Mouth/Throat:     Mouth: Mucous membranes are moist.  Eyes:     General: No scleral icterus.    Conjunctiva/sclera: Conjunctivae normal.  Cardiovascular:     Rate and Rhythm: Regular rhythm. Bradycardia present. Occasional Extrasystoles are present.    Pulses: Normal pulses.     Heart sounds: No murmur heard.    No friction rub. No gallop.     Comments: EKG- SB with SA and rare PVC, 59 bpm No LVH, Q waves, or ST/T wave changes   Pulmonary:     Effort: Pulmonary effort is normal.     Breath sounds: No stridor. No wheezing, rhonchi or rales.  Abdominal:     General: Abdomen is flat.     Palpations: There is no mass.     Tenderness: There is no abdominal tenderness. There is no guarding.     Hernia: No hernia is present.  Musculoskeletal:     Cervical back: Neck supple.     Right lower leg: No edema.     Left lower leg: No edema.  Lymphadenopathy:     Cervical: No cervical adenopathy.  Skin:    General: Skin is warm and dry.  Neurological:     General: No focal deficit present.     Mental Status: He is alert. Mental status is at baseline.  Psychiatric:        Mood and Affect: Mood normal.        Behavior: Behavior normal.     Lab Results  Component Value Date   WBC 7.3 12/27/2023   HGB 15.0 12/27/2023   HCT 44.9 12/27/2023   PLT 225.0 12/27/2023   GLUCOSE 109 (H) 12/27/2023   CHOL 128 12/27/2023   TRIG 62.0 12/27/2023   HDL 59.20 12/27/2023   LDLCALC 56 12/27/2023   ALT 36 12/27/2023   AST 37 12/27/2023   NA 137 12/27/2023   K 4.7 12/27/2023   CL 102 12/27/2023   CREATININE 0.67 12/27/2023   BUN 12 12/27/2023   CO2 27 12/27/2023   TSH 2.38 12/27/2023   PSA 4.26 (H) 08/19/2023   INR 1.9 (A) 12/05/2023   HGBA1C  5.7 (H) 09/05/2014    DG INJECT DIAG/THERA/INC NEEDLE/CATH/PLC EPI/LUMB/SAC W/IMG Result Date: 09/15/2022 CLINICAL DATA:  Lumbosacral spondylosis without myelopathy. Lateral right thigh pain. Epidural injection requested at L3-4. FLUOROSCOPY: Radiation Exposure Index (as provided by the fluoroscopic device): 1.10 mGy Kerma PROCEDURE: The procedure, risks, benefits, and alternatives were explained to the patient. Questions regarding the procedure were encouraged and answered. The patient understands and consents to the procedure. LUMBAR EPIDURAL INJECTION: An interlaminar approach was performed on  the right at L3-4. The overlying skin was cleansed and anesthetized. A 3.5 inch 20 gauge epidural needle was advanced using loss-of-resistance technique. DIAGNOSTIC EPIDURAL INJECTION: Injection of Isovue-M 200 shows a good epidural pattern with spread above and below the level of needle placement, primarily on the right. No vascular opacification is seen. THERAPEUTIC EPIDURAL INJECTION: 80 mg of Depo-Medrol mixed with 3 mL of 1% lidocaine were instilled. The procedure was well-tolerated, and the patient was discharged thirty minutes following the injection in good condition. COMPLICATIONS: None immediate IMPRESSION: Technically successful interlaminar epidural injection on the right at L3-4. Electronically Signed   By: Sebastian Ache M.D.   On: 09/15/2022 12:32    Assessment & Plan:   Primary hypertension- His BP is adequately well controlled. EKG is negative for LVH. -     Basic metabolic panel with GFR; Future -     CBC with Differential/Platelet; Future -     Urinalysis, Routine w reflex microscopic; Future -     EKG 12-Lead -     amLODIPine Besylate; Take 1 tablet (5 mg total) by mouth daily.  Dispense: 90 tablet; Refill: 1  Encounter for general adult medical examination with abnormal findings- Exam completed, labs reviewed, vaccines reviewed and updated, cancer screenings are UTD, pt ed material was  given.   Hyperlipidemia with target LDL less than 100- LDL goal achieved. Doing well on the statin  -     Lipid panel; Future -     TSH; Future -     Hepatic function panel; Future  PAF- CHADVASC -5- He has good R/R control.  Bradycardia- Asx. 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  Coronary artery disease involving coronary bypass graft of native heart without angina pectoris -     amLODIPine Besylate; Take 1 tablet (5 mg total) by mouth daily.  Dispense: 90 tablet; Refill: 1     Follow-up: Return in about 6 months (around 06/27/2024).  Sanda Linger, MD

## 2023-12-27 NOTE — Patient Instructions (Signed)
 Health Maintenance, Male  Adopting a healthy lifestyle and getting preventive care are important in promoting health and wellness. Ask your health care provider about:  The right schedule for you to have regular tests and exams.  Things you can do on your own to prevent diseases and keep yourself healthy.  What should I know about diet, weight, and exercise?  Eat a healthy diet    Eat a diet that includes plenty of vegetables, fruits, low-fat dairy products, and lean protein.  Do not eat a lot of foods that are high in solid fats, added sugars, or sodium.  Maintain a healthy weight  Body mass index (BMI) is a measurement that can be used to identify possible weight problems. It estimates body fat based on height and weight. Your health care provider can help determine your BMI and help you achieve or maintain a healthy weight.  Get regular exercise  Get regular exercise. This is one of the most important things you can do for your health. Most adults should:  Exercise for at least 150 minutes each week. The exercise should increase your heart rate and make you sweat (moderate-intensity exercise).  Do strengthening exercises at least twice a week. This is in addition to the moderate-intensity exercise.  Spend less time sitting. Even light physical activity can be beneficial.  Watch cholesterol and blood lipids  Have your blood tested for lipids and cholesterol at 77 years of age, then have this test every 5 years.  You may need to have your cholesterol levels checked more often if:  Your lipid or cholesterol levels are high.  You are older than 77 years of age.  You are at high risk for heart disease.  What should I know about cancer screening?  Many types of cancers can be detected early and may often be prevented. Depending on your health history and family history, you may need to have cancer screening at various ages. This may include screening for:  Colorectal cancer.  Prostate cancer.  Skin cancer.  Lung  cancer.  What should I know about heart disease, diabetes, and high blood pressure?  Blood pressure and heart disease  High blood pressure causes heart disease and increases the risk of stroke. This is more likely to develop in people who have high blood pressure readings or are overweight.  Talk with your health care provider about your target blood pressure readings.  Have your blood pressure checked:  Every 3-5 years if you are 9-95 years of age.  Every year if you are 85 years old or older.  If you are between the ages of 29 and 29 and are a current or former smoker, ask your health care provider if you should have a one-time screening for abdominal aortic aneurysm (AAA).  Diabetes  Have regular diabetes screenings. This checks your fasting blood sugar level. Have the screening done:  Once every three years after age 23 if you are at a normal weight and have a low risk for diabetes.  More often and at a younger age if you are overweight or have a high risk for diabetes.  What should I know about preventing infection?  Hepatitis B  If you have a higher risk for hepatitis B, you should be screened for this virus. Talk with your health care provider to find out if you are at risk for hepatitis B infection.  Hepatitis C  Blood testing is recommended for:  Everyone born from [birth year] through 1964.  Anyone  with known risk factors for hepatitis C.  Sexually transmitted infections (STIs)  You should be screened each year for STIs, including gonorrhea and chlamydia, if:  You are sexually active and are younger than 77 years of age.  You are older than 78 years of age and your health care provider tells you that you are at risk for this type of infection.  Your sexual activity has changed since you were last screened, and you are at increased risk for chlamydia or gonorrhea. Ask your health care provider if you are at risk.  Ask your health care provider about whether you are at high risk for HIV. Your health care provider  may recommend a prescription medicine to help prevent HIV infection. If you choose to take medicine to prevent HIV, you should first get tested for HIV. You should then be tested every 3 months for as long as you are taking the medicine.  Follow these instructions at home:  Alcohol use  Do not drink alcohol if your health care provider tells you not to drink.  If you drink alcohol:  Limit how much you have to 0-2 drinks a day.  Know how much alcohol is in your drink. In the [place] one drink equals one 12 oz bottle of beer (355 mL), one 5 oz glass of wine (148 mL), or one 1 oz glass of hard liquor (44 mL).  Lifestyle  Do not use any products that contain nicotine or tobacco. These products include cigarettes, chewing tobacco, and vaping devices, such as e-cigarettes. If you need help quitting, ask your health care provider.  Do not use street drugs.  Do not share needles.  Ask your health care provider for help if you need support or information about quitting drugs.  General instructions  Schedule regular health, dental, and eye exams.  Stay current with your vaccines.  Tell your health care provider if:  You often feel depressed.  You have ever been abused or do not feel safe at home.  Summary  Adopting a healthy lifestyle and getting preventive care are important in promoting health and wellness.  Follow your health care provider's instructions about healthy diet, exercising, and getting tested or screened for diseases.  Follow your health care provider's instructions on monitoring your cholesterol and blood pressure.  This information is not intended to replace advice given to you by your health care provider. Make sure you discuss any questions you have with your health care provider.  Document Revised: 02/02/2021 Document Reviewed: 02/02/2021  Elsevier Patient Education  2024 [employer].

## 2024-01-20 ENCOUNTER — Encounter

## 2024-01-20 NOTE — Progress Notes (Signed)
 This encounter was created in error - please disregard.  CALLED X 3 W/NO ANSWER/LDM TO CALL AND RESCHEDULE

## 2024-01-30 ENCOUNTER — Ambulatory Visit: Attending: Cardiology

## 2024-01-30 ENCOUNTER — Ambulatory Visit (INDEPENDENT_AMBULATORY_CARE_PROVIDER_SITE_OTHER)

## 2024-01-30 VITALS — Ht 73.0 in | Wt 183.0 lb

## 2024-01-30 DIAGNOSIS — Z0000 Encounter for general adult medical examination without abnormal findings: Secondary | ICD-10-CM

## 2024-01-30 DIAGNOSIS — I480 Paroxysmal atrial fibrillation: Secondary | ICD-10-CM | POA: Diagnosis not present

## 2024-01-30 DIAGNOSIS — I6310 Cerebral infarction due to embolism of unspecified precerebral artery: Secondary | ICD-10-CM | POA: Diagnosis not present

## 2024-01-30 DIAGNOSIS — Z7901 Long term (current) use of anticoagulants: Secondary | ICD-10-CM

## 2024-01-30 LAB — POCT INR: INR: 2.4 (ref 2.0–3.0)

## 2024-01-30 NOTE — Progress Notes (Signed)
 Subjective:   [name] is a 78 y.o. who presents for a Medicare Wellness preventive visit.  Visit Complete: Virtual I connected with  [name] on 01/30/24 by a audio enabled telemedicine application and verified that I am speaking with the correct person using two identifiers.  Patient Location: Home  Provider Location: Office/Clinic  I discussed the limitations of evaluation and management by telemedicine. The patient expressed understanding and agreed to proceed.  Vital Signs: Because this visit was a virtual/telehealth visit, some criteria may be missing or patient reported. Any vitals not documented were not able to be obtained and vitals that have been documented are patient reported.  VideoDeclined- This patient declined Librarian, academic. Therefore the visit was completed with audio only.  Persons Participating in Visit: Patient.  AWV Questionnaire: No: Patient Medicare AWV questionnaire was not completed prior to this visit.  Cardiac Risk Factors include: advanced age (>45men, >82 women);hypertension;dyslipidemia;male gender     Objective:    Today's Vitals   01/30/24 0813  Weight: 183 lb (83 kg)  Height: 6\' 1"  (1.854 m)   Body mass index is 24.14 kg/m.     01/30/2024    8:13 AM 04/20/2022   10:40 AM 05/14/2020    1:32 PM 08/31/2016    9:34 AM 08/15/2016   10:08 AM 09/06/2014   10:00 PM 08/29/2014    7:34 AM  Advanced Directives  Does Patient Have a Medical Advance Directive? Yes Yes No Yes Yes No No  Type of Estate agent of [place];Living will Living will;Healthcare Power of [employer] Power of [place];Living will Healthcare Power of [place];Living will    Does patient want to make changes to medical advance directive?  No - Patient declined   No - Patient declined    Copy of Healthcare Power of Attorney in Chart? No - copy requested No - copy requested  Yes Yes    Would patient like  information on creating a medical advance directive?   No - Patient declined   No - patient declined information No - patient declined information    Current Medications (verified) Outpatient Encounter Medications as of 01/30/2024  Medication Sig   amLODipine  (NORVASC ) 5 MG tablet Take 1 tablet (5 mg total) by mouth daily.   atorvastatin  (LIPITOR) 20 MG tablet TAKE 1 TABLET (20 MG TOTAL) BY MOUTH DAILY. NEEDS OV FOR REFILL   metoprolol  tartrate (LOPRESSOR ) 25 MG tablet TAKE 1 AND 1/2 TABLETS BY MOUTH TWICE DAILY   NITROSTAT  0.4 MG SL tablet Place 1 tablet under the tongue as needed for chest pain.   vitamin B-12 (CYANOCOBALAMIN ) 1000 MCG tablet Take 1,000 mcg by mouth daily.   warfarin (COUMADIN ) 5 MG tablet TAKE 1 TABLET BY MOUTH ONCE DAILY AS DIRECTED BY ANTICOAGULATION CLINIC   No facility-administered encounter medications on file as of 01/30/2024.    Allergies (verified) Patient has no known allergies.   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  Past Surgical History:  Procedure Laterality Date   BACK SURGERY  1986   CORONARY ARTERY BYPASS GRAFT N/A 09/06/2014   Procedure: CORONARY ARTERY BYPASS GRAFTING (CABG);  Surgeon: Heriberto London, MD;  Location: [facility] OR;  Service: Open Heart Surgery;  Laterality: N/A;  CABG times 4, using internal mammary artery, and right leg saphenous vein harvested endoscopically   LEFT HEART CATHETERIZATION WITH CORONARY ANGIOGRAM N/A 08/29/2014   Procedure: LEFT HEART CATHETERIZATION WITH CORONARY ANGIOGRAM;  Surgeon: Avanell Leigh, MD;  Location[facility] CATH LAB;  Service:  Cardiovascular;  Laterality: N/A;   LUMBAR DISC SURGERY  1986   Dr. Gwynn Lesches Deaton   ROTATOR CUFF REPAIR  06/10/12   at [facility]...left shoulder   TEE WITHOUT CARDIOVERSION N/A 09/06/2014   Procedure: TRANSESOPHAGEAL ECHOCARDIOGRAM (TEE);  Surgeon: Heriberto London, MD;  Location: [facility] OR;  Service: Open Heart Surgery;  Laterality: N/A;   TONSILLECTOMY  1954   TONSILLECTOMY     Family History  Problem Relation Age of Onset   GI problems Mother    Heart disease Father    Hypertension Father    Heart attack Father    Cancer Neg Hx    Diabetes Neg Hx    Early death Neg Hx    Hearing loss Neg Hx    Hyperlipidemia Neg Hx    Kidney disease Neg Hx    Stroke Neg Hx    Alcohol abuse Neg Hx    Colon cancer Neg Hx    Stomach cancer Neg Hx    Social History   Socioeconomic History   Marital status: Married    Spouse name: Not on file   Number of children: 2   Years of education: 12   Highest education level: GED or equivalent  Occupational History   Occupation: Physicist, medical - paint  Tobacco Use   Smoking status: Former    Current packs/day: 0.00    Average packs/day: 1 pack/day for 15.0 years (15.0 ttl pk-yrs)    Types: Cigarettes    Start date: 09/28/1963    Quit date: 09/27/1978    Years since quitting: 45.3    Passive exposure: Past   Smokeless tobacco: Never  Vaping Use   Vaping status: Never Used  Substance and Sexual Activity   Alcohol use: Not Currently   Drug use: Not Currently    Comment: stopped a few days ago per patientr   Sexual activity: Yes    Partners: Female  Other Topics Concern   Not on file  Social History Narrative   [employer]G. [employer] for 3 years - did a tour in [place]. Married '79. 1 dtr- '68, 1 step son. 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 Marriage in good health. He reports he is very strong in his faith.       Pt stated no recreational use as of 01/30/2024.   Social Drivers of Manufacturing engineer Strain: Low Risk  (01/30/2024)   Overall Financial Resource Strain (CARDIA)    Difficulty of Paying Living Expenses: Not hard at all  Food Insecurity: No Food Insecurity (01/30/2024)   Hunger Vital Sign    Worried About Running Out of Food in the Last Year: Never true    Ran Out of Food in the Last Year: Never true  Transportation Needs: No Transportation Needs (01/30/2024)   PRAPARE - Administrator, Civil Service (Medical): No  Lack of Transportation (Non-Medical): No  Physical Activity: Insufficiently Active (01/30/2024)   Exercise Vital Sign    Days of Exercise per Week: 2 days    Minutes of Exercise per Session: 60 min  Stress: No Stress Concern Present (01/30/2024)   [employer] of Occupational Health - Occupational Stress Questionnaire    Feeling of Stress : Not at all  Social Connections: Unknown (01/30/2024)   Social Connection and Isolation Panel [NHANES]    Frequency of Communication with Friends and Family: More than three times a week    Frequency of Social Gatherings with Friends and Family: More than three times a week    Attends Religious Services: Patient declined    Database administrator or Organizations: No    Attends Engineer, structural: Never    Marital Status: Married    Tobacco Counseling Counseling given: No    Clinical Intake:  Pre-visit preparation completed: Yes  Pain : No/denies pain     BMI - recorded: 24.14 Nutritional Status: BMI of 19-24  Normal Nutritional Risks: None Diabetes: No  Lab Results  Component Value Date   HGBA1C 5.7 (H) 09/05/2014   HGBA1C  05/26/2010    5.1 (NOTE)                                                                       According to the ADA Clinical Practice Recommendations for 2011, when HbA1c is used as a screening test:   >=6.5%   Diagnostic of Diabetes Mellitus           (if abnormal result  is confirmed)  5.7-6.4%   Increased risk of developing Diabetes Mellitus   References:Diagnosis and Classification of Diabetes Mellitus,Diabetes Care,2011,34(Suppl 1):S62-S69 and Standards of Medical Care in         Diabetes - 2011,Diabetes Care,2011,34  (Suppl 1):S11-S61.     How often do you need to have someone help you when you read instructions, pamphlets, or other written materials from your doctor or pharmacy?: 1 - Never  Interpreter Needed?: No  Information entered by :: Kandy Orris, CMA   Activities of Daily Living     01/30/2024    8:15 AM  In your present state of health, do you have any difficulty performing the following activities:  Hearing? 0  Vision? 0  Difficulty concentrating or making decisions? 0  Walking or climbing stairs? 0  Dressing or bathing? 0  Doing errands, shopping? 0  Preparing Food and eating ? N  Using the Toilet? N  In the past six months, have you accidently leaked urine? N  Do you have problems with loss of bowel control? N  Managing your Medications? N  Managing your Finances? N  Housekeeping or managing your Housekeeping? N    Patient Care Team: Arcadio Knuckles, MD as PCP - General (Internal Medicine) Audery Blazing Deannie Fabian, MD as PCP - Cardiology (Cardiology) Corie Diamond, MD as Attending Physician (Ophthalmology) Audery Blazing Deannie Fabian, MD as Consulting Physician (Cardiology)  Indicate any recent Medical Services you may have received from other than [facility] providers in the past year (date may be approximate).     Assessment:   This is a routine wellness examination for Carly.  Hearing/Vision screen Hearing  Screening - Comments:: Denies hearing difficulties   Vision Screening - Comments:: Wears eyeglasses - seen by [facility]   Goals Addressed               This Visit's Progress     Patient Stated (pt-stated)        Patient stated he plans to stay healthy.       Depression Screen     01/30/2024    8:19 AM 12/27/2023    9:58 AM 04/11/2023    9:08 AM 04/20/2022   10:39 AM 06/11/2021    9:07 AM  04/15/2020    8:39 AM 03/27/2019    9:59 AM  PHQ 2/9 Scores  PHQ - 2 Score 0 0 0 0 0 0 0  PHQ- 9 Score 0          Fall Risk     01/30/2024    8:17 AM 12/27/2023    9:57 AM 04/11/2023    9:08 AM 04/20/2022   10:42 AM 06/11/2021    9:07 AM  Fall Risk   Falls in the past year? 0 0 0 0 0  Number falls in past yr: 0 0 0 0   Injury with Fall? 0 0 0 0   Risk for fall due to : No Fall Risks No Fall Risks No Fall Risks No Fall Risks   Follow up Falls prevention discussed;Falls evaluation completed Falls evaluation completed Falls evaluation completed Falls evaluation completed     MEDICARE RISK AT HOME:  Medicare Risk at Home Any stairs in or around the home?: No If so, are there any without handrails?: No Home free of loose throw rugs in walkways, pet beds, electrical cords, etc?: Yes Adequate lighting in your home to reduce risk of falls?: Yes Life alert?: No Use of a cane, walker or w/c?: No Grab bars in the bathroom?: No Shower chair or bench in shower?: No Elevated toilet seat or a handicapped toilet?: No  TIMED UP AND GO:  Was the test performed?  No  Cognitive Function: 6CIT completed        01/30/2024    8:17 AM 04/20/2022   11:03 AM  6CIT Screen  What Year? 0 points 0 points  What month? 0 points 0 points  What time? 0 points 0 points  Count back from 20 0 points 0 points  Months in reverse 0 points 0 points  Repeat phrase 0 points 0 points  Total Score 0 points 0 points    Immunizations Immunization History  Administered Date(s) Administered   Fluad Quad(high Dose 65+) 06/07/2019, 06/11/2021, 07/07/2022   Fluad Trivalent(High Dose 65+) 07/11/2023   Influenza Inj Mdck Quad Pf 06/18/2018   Influenza Whole 09/27/2011   Influenza, High Dose Seasonal PF 07/05/2014, 07/08/2017   Influenza-Unspecified 07/08/2016   PFIZER(Purple Top)SARS-COV-2 Vaccination 10/19/2019, 11/10/2019, 07/12/2020   Pfizer(Comirnaty)Fall Seasonal Vaccine 12 years and older 07/11/2023    Pneumococcal Conjugate-13 02/19/2014   Pneumococcal Polysaccharide-23 11/15/2011, 03/27/2019   Tdap 11/15/2011   Zoster Recombinant(Shingrix ) 08/31/2019, 01/18/2020    Screening Tests Health Maintenance  Topic Date Due   DTaP/Tdap/Td (2 - Td or Tdap) 11/14/2021   COVID-19 Vaccine (5 - 2024-25 season) 01/09/2024   Colonoscopy  12/26/2024 (Originally 10/26/2022)   INFLUENZA VACCINE  04/27/2024   Medicare Annual Wellness (AWV)  01/29/2025   Pneumonia Vaccine 17+ Years old  Completed   Hepatitis C Screening  Completed   Zoster Vaccines- Shingrix   Completed   HPV VACCINES  Aged  Out   Meningococcal B Vaccine  Aged Out    Health Maintenance  Health Maintenance Due  Topic Date Due   DTaP/Tdap/Td (2 - Td or Tdap) 11/14/2021   COVID-19 Vaccine (5 - 2024-25 season) 01/09/2024   Health Maintenance Items Addressed:01/30/2024   Additional Screening:  Vision Screening: Recommended annual ophthalmology exams for early detection of glaucoma and other disorders of the eye.  Dental Screening: Recommended annual dental exams for proper oral hygiene  Community Resource Referral / Chronic Care Management: CRR required this visit?  No   CCM required this visit?  No     Plan:     I have personally reviewed and noted the following in the patient's chart:   Medical and social history Use of alcohol, tobacco or illicit drugs  Current medications and supplements including opioid prescriptions. Patient is not currently taking opioid prescriptions. Functional ability and status Nutritional status Physical activity Advanced directives List of other physicians Hospitalizations, surgeries, and ER visits in previous 12 months Vitals Screenings to include cognitive, depression, and falls Referrals and appointments  In addition, I have reviewed and discussed with patient certain preventive protocols, quality metrics, and best practice recommendations. A written personalized care plan for  preventive services as well as general preventive health recommendations were provided to patient.     Patria Bookbinder, CMA   01/30/2024   After Visit Summary: (MyChart) Due to this being a telephonic visit, the after visit summary with patients personalized plan was offered to patient via MyChart   Notes: Nothing significant to report at this time.

## 2024-01-30 NOTE — Patient Instructions (Signed)
 [name] , Thank you for taking time to come for your Medicare Wellness Visit. I appreciate your ongoing commitment to your health goals. Please review the following plan we discussed and let me know if I can assist you in the future.   Referrals/Orders/Follow-Ups/Clinician Recommendations: Aim for 30 minutes of exercise or brisk walking, 6-8 glasses of water, and 5 servings of fruits and vegetables each day. Educated and advised on getting the COVID and Tdap (Tetenus) vaccines at [facility] in 2025.  This is a list of the screening recommended for you and due dates:  Health Maintenance  Topic Date Due   DTaP/Tdap/Td vaccine (2 - Td or Tdap) 11/14/2021   COVID-19 Vaccine (5 - 2024-25 season) 01/09/2024   Colon Cancer Screening  12/26/2024*   Flu Shot  04/27/2024   Medicare Annual Wellness Visit  01/29/2025   Pneumonia Vaccine  Completed   Hepatitis C Screening  Completed   Zoster (Shingles) Vaccine  Completed   HPV Vaccine  Aged Out   Meningitis B Vaccine  Aged Out  *Topic was postponed. The date shown is not the original due date.    Advanced directives: (Copy Requested) Please bring a copy of your health care power of attorney and living will to the office to be added to your chart at your convenience. You can mail to [facility] [address] [place], [place] [address] or email to [email or web address]  Next Medicare Annual Wellness Visit scheduled for next year: Yes  Have you seen your provider in the last 6 months (3 months if uncontrolled diabetes)? Yes

## 2024-01-30 NOTE — Patient Instructions (Signed)
 Continue taking 1 tablet daily.  Recheck INR in 8 weeks per patient.  [phone]

## 2024-03-03 ENCOUNTER — Other Ambulatory Visit: Payer: Self-pay | Admitting: Cardiology

## 2024-03-03 DIAGNOSIS — I480 Paroxysmal atrial fibrillation: Secondary | ICD-10-CM

## 2024-03-04 ENCOUNTER — Other Ambulatory Visit: Payer: Self-pay | Admitting: Cardiology

## 2024-03-04 DIAGNOSIS — E785 Hyperlipidemia, unspecified: Secondary | ICD-10-CM

## 2024-03-26 ENCOUNTER — Ambulatory Visit

## 2024-03-26 ENCOUNTER — Ambulatory Visit: Attending: Cardiology

## 2024-03-26 DIAGNOSIS — I480 Paroxysmal atrial fibrillation: Secondary | ICD-10-CM | POA: Diagnosis not present

## 2024-03-26 DIAGNOSIS — I6310 Cerebral infarction due to embolism of unspecified precerebral artery: Secondary | ICD-10-CM | POA: Diagnosis not present

## 2024-03-26 DIAGNOSIS — Z7901 Long term (current) use of anticoagulants: Secondary | ICD-10-CM

## 2024-03-26 LAB — POCT INR: INR: 2.6 (ref 2.0–3.0)

## 2024-03-26 NOTE — Patient Instructions (Signed)
 Description   Continue taking 1 tablet daily.   Recheck INR in 8 weeks.   Coumadin  Clinic [phone]

## 2024-03-26 NOTE — Progress Notes (Signed)
Please see anticoagulation encounter.

## 2024-03-27 ENCOUNTER — Encounter

## 2024-04-03 ENCOUNTER — Other Ambulatory Visit: Payer: Self-pay | Admitting: Cardiology

## 2024-04-03 DIAGNOSIS — E785 Hyperlipidemia, unspecified: Secondary | ICD-10-CM

## 2024-04-18 DIAGNOSIS — H35373 Puckering of macula, bilateral: Secondary | ICD-10-CM | POA: Diagnosis not present

## 2024-04-18 DIAGNOSIS — Z961 Presence of intraocular lens: Secondary | ICD-10-CM | POA: Diagnosis not present

## 2024-04-18 DIAGNOSIS — H52203 Unspecified astigmatism, bilateral: Secondary | ICD-10-CM | POA: Diagnosis not present

## 2024-05-21 ENCOUNTER — Ambulatory Visit: Attending: Cardiology

## 2024-05-21 DIAGNOSIS — I6310 Cerebral infarction due to embolism of unspecified precerebral artery: Secondary | ICD-10-CM | POA: Diagnosis not present

## 2024-05-21 DIAGNOSIS — Z7901 Long term (current) use of anticoagulants: Secondary | ICD-10-CM

## 2024-05-21 DIAGNOSIS — I480 Paroxysmal atrial fibrillation: Secondary | ICD-10-CM

## 2024-05-21 LAB — POCT INR: INR: 2.1 (ref 2.0–3.0)

## 2024-05-21 NOTE — Patient Instructions (Signed)
 Continue taking 1 tablet daily.   Recheck INR in 8 weeks.   Coumadin  Clinic [phone]

## 2024-05-21 NOTE — Progress Notes (Signed)
 INR 2.1 Please see anticoagulation encounter Continue taking 1 tablet daily.   Recheck INR in 8 weeks.   Coumadin  Clinic [phone]

## 2024-07-16 ENCOUNTER — Ambulatory Visit: Attending: Cardiology

## 2024-07-16 DIAGNOSIS — Z7901 Long term (current) use of anticoagulants: Secondary | ICD-10-CM | POA: Diagnosis not present

## 2024-07-16 DIAGNOSIS — I6310 Cerebral infarction due to embolism of unspecified precerebral artery: Secondary | ICD-10-CM | POA: Diagnosis not present

## 2024-07-16 DIAGNOSIS — I480 Paroxysmal atrial fibrillation: Secondary | ICD-10-CM

## 2024-07-16 LAB — POCT INR: INR: 2.6 (ref 2.0–3.0)

## 2024-07-16 NOTE — Progress Notes (Signed)
 INR 2.6 Please see anticoagulation encounter Continue taking 1 tablet daily.   Recheck INR in 8 weeks.   Coumadin  Clinic [phone]

## 2024-07-16 NOTE — Patient Instructions (Signed)
 Continue taking 1 tablet daily.   Recheck INR in 8 weeks.   Coumadin  Clinic [phone]

## 2024-08-06 ENCOUNTER — Other Ambulatory Visit: Payer: Self-pay

## 2024-08-06 DIAGNOSIS — I10 Essential (primary) hypertension: Secondary | ICD-10-CM

## 2024-08-06 DIAGNOSIS — E785 Hyperlipidemia, unspecified: Secondary | ICD-10-CM

## 2024-08-06 DIAGNOSIS — I480 Paroxysmal atrial fibrillation: Secondary | ICD-10-CM

## 2024-08-08 ENCOUNTER — Other Ambulatory Visit: Payer: Self-pay | Admitting: Cardiology

## 2024-08-08 DIAGNOSIS — E785 Hyperlipidemia, unspecified: Secondary | ICD-10-CM

## 2024-08-08 MED ORDER — ATORVASTATIN CALCIUM 20 MG PO TABS: 20.0000 mg | ORAL_TABLET | Freq: Every day | ORAL | 0 refills | Status: DC

## 2024-08-08 MED ORDER — METOPROLOL TARTRATE 25 MG PO TABS: 37.5000 mg | ORAL_TABLET | Freq: Two times a day (BID) | ORAL | 0 refills | Status: DC

## 2024-08-18 ENCOUNTER — Other Ambulatory Visit: Payer: Self-pay | Admitting: Cardiology

## 2024-08-18 DIAGNOSIS — I10 Essential (primary) hypertension: Secondary | ICD-10-CM

## 2024-08-18 DIAGNOSIS — I480 Paroxysmal atrial fibrillation: Secondary | ICD-10-CM

## 2024-09-04 ENCOUNTER — Other Ambulatory Visit: Payer: Self-pay | Admitting: Cardiology

## 2024-09-04 DIAGNOSIS — I10 Essential (primary) hypertension: Secondary | ICD-10-CM

## 2024-09-04 DIAGNOSIS — I480 Paroxysmal atrial fibrillation: Secondary | ICD-10-CM

## 2024-09-10 ENCOUNTER — Ambulatory Visit

## 2024-09-10 DIAGNOSIS — I6310 Cerebral infarction due to embolism of unspecified precerebral artery: Secondary | ICD-10-CM

## 2024-09-10 DIAGNOSIS — Z7901 Long term (current) use of anticoagulants: Secondary | ICD-10-CM

## 2024-09-10 DIAGNOSIS — I480 Paroxysmal atrial fibrillation: Secondary | ICD-10-CM

## 2024-09-10 LAB — POCT INR: INR: 2.1 (ref 2.0–3.0)

## 2024-09-10 NOTE — Patient Instructions (Signed)
 Continue taking 1 tablet daily.   Recheck INR in 8 weeks.   Coumadin  Clinic [phone]

## 2024-09-10 NOTE — Progress Notes (Signed)
 INR 2.1 Please see anticoagulation encounter Continue taking 1 tablet daily.   Recheck INR in 8 weeks.   Coumadin  Clinic [phone]

## 2024-09-22 ENCOUNTER — Other Ambulatory Visit: Payer: Self-pay | Admitting: Internal Medicine

## 2024-09-22 DIAGNOSIS — I25810 Atherosclerosis of coronary artery bypass graft(s) without angina pectoris: Secondary | ICD-10-CM

## 2024-09-22 DIAGNOSIS — I10 Essential (primary) hypertension: Secondary | ICD-10-CM

## 2024-09-29 ENCOUNTER — Other Ambulatory Visit: Payer: Self-pay | Admitting: Cardiology

## 2024-09-29 DIAGNOSIS — I10 Essential (primary) hypertension: Secondary | ICD-10-CM

## 2024-09-29 DIAGNOSIS — I480 Paroxysmal atrial fibrillation: Secondary | ICD-10-CM

## 2024-09-30 ENCOUNTER — Other Ambulatory Visit: Payer: Self-pay | Admitting: Cardiology

## 2024-09-30 DIAGNOSIS — I10 Essential (primary) hypertension: Secondary | ICD-10-CM

## 2024-09-30 DIAGNOSIS — I480 Paroxysmal atrial fibrillation: Secondary | ICD-10-CM

## 2024-10-22 ENCOUNTER — Other Ambulatory Visit: Payer: Self-pay | Admitting: Cardiology

## 2024-10-22 DIAGNOSIS — I480 Paroxysmal atrial fibrillation: Secondary | ICD-10-CM

## 2024-10-22 DIAGNOSIS — E785 Hyperlipidemia, unspecified: Secondary | ICD-10-CM

## 2024-10-26 ENCOUNTER — Telehealth: Payer: Self-pay | Admitting: Cardiology

## 2024-10-26 DIAGNOSIS — E785 Hyperlipidemia, unspecified: Secondary | ICD-10-CM

## 2024-10-26 NOTE — Telephone Encounter (Signed)
 Pt had a lipid panel done on 12/27/2023. Medication sent to pt's pharmacy as requested.

## 2024-10-26 NOTE — Telephone Encounter (Signed)
 Patient is following up regarding this request. He says he is completely out of medication.

## 2024-10-29 ENCOUNTER — Ambulatory Visit: Admitting: Cardiology

## 2024-10-29 MED ORDER — ATORVASTATIN CALCIUM 20 MG PO TABS: 20.0000 mg | ORAL_TABLET | Freq: Every day | ORAL | 3 refills | Status: AC

## 2024-10-29 NOTE — Telephone Encounter (Signed)
 Appointment rescheduled to may. Refill sent to the pharmacy electronically.

## 2024-10-29 NOTE — Addendum Note (Signed)
 Addended by: Ghazal Pevey W on: 10/29/2024 04:11 PM   Modules accepted: Orders

## 2024-11-05 ENCOUNTER — Ambulatory Visit

## 2024-12-31 ENCOUNTER — Encounter: Admitting: Internal Medicine

## 2025-01-30 ENCOUNTER — Ambulatory Visit

## 2025-02-22 ENCOUNTER — Ambulatory Visit: Admitting: Cardiology
